# Patient Record
Sex: Female | Born: 1935 | Race: White | Hispanic: No | Smoking: Former smoker
Health system: Southern US, Community
[De-identification: ages and names within clinical notes are randomized; demographics above are authoritative.]

## PROBLEM LIST (undated history)

## (undated) DIAGNOSIS — R002 Palpitations: Secondary | ICD-10-CM

## (undated) DIAGNOSIS — F0280 Dementia in other diseases classified elsewhere without behavioral disturbance: Secondary | ICD-10-CM

## (undated) DIAGNOSIS — E538 Deficiency of other specified B group vitamins: Secondary | ICD-10-CM

## (undated) DIAGNOSIS — Z974 Presence of external hearing-aid: Secondary | ICD-10-CM

## (undated) DIAGNOSIS — I10 Essential (primary) hypertension: Secondary | ICD-10-CM

## (undated) DIAGNOSIS — C50919 Malignant neoplasm of unspecified site of unspecified female breast: Secondary | ICD-10-CM

## (undated) DIAGNOSIS — M810 Age-related osteoporosis without current pathological fracture: Secondary | ICD-10-CM

## (undated) DIAGNOSIS — Z9114 Patient's other noncompliance with medication regimen: Secondary | ICD-10-CM

## (undated) DIAGNOSIS — I8390 Asymptomatic varicose veins of unspecified lower extremity: Secondary | ICD-10-CM

## (undated) DIAGNOSIS — G4700 Insomnia, unspecified: Secondary | ICD-10-CM

## (undated) DIAGNOSIS — I8310 Varicose veins of unspecified lower extremity with inflammation: Secondary | ICD-10-CM

## (undated) DIAGNOSIS — M1990 Unspecified osteoarthritis, unspecified site: Secondary | ICD-10-CM

## (undated) DIAGNOSIS — F419 Anxiety disorder, unspecified: Secondary | ICD-10-CM

## (undated) DIAGNOSIS — R9389 Abnormal findings on diagnostic imaging of other specified body structures: Secondary | ICD-10-CM

## (undated) DIAGNOSIS — R443 Hallucinations, unspecified: Secondary | ICD-10-CM

## (undated) DIAGNOSIS — Z91148 Patient's other noncompliance with medication regimen for other reason: Secondary | ICD-10-CM

## (undated) DIAGNOSIS — E559 Vitamin D deficiency, unspecified: Secondary | ICD-10-CM

## (undated) DIAGNOSIS — R0602 Shortness of breath: Secondary | ICD-10-CM

## (undated) DIAGNOSIS — G8929 Other chronic pain: Secondary | ICD-10-CM

## (undated) HISTORY — DX: Dementia in other diseases classified elsewhere, unspecified severity, without behavioral disturbance, psychotic disturbance, mood disturbance, and anxiety: F02.80

## (undated) HISTORY — DX: Malignant neoplasm of unspecified site of unspecified female breast: C50.919

## (undated) HISTORY — PX: TONSILLECTOMY: SUR1361

## (undated) HISTORY — DX: Shortness of breath: R06.02

## (undated) HISTORY — DX: Age-related osteoporosis without current pathological fracture: M81.0

## (undated) HISTORY — DX: Other chronic pain: G89.29

## (undated) HISTORY — DX: Varicose veins of unspecified lower extremity with inflammation: I83.10

## (undated) HISTORY — DX: Presence of external hearing-aid: Z97.4

## (undated) HISTORY — PX: CATARACT EXTRACTION, BILATERAL: SHX1313

## (undated) HISTORY — DX: Anxiety disorder, unspecified: F41.9

## (undated) HISTORY — DX: Patient's other noncompliance with medication regimen: Z91.14

## (undated) HISTORY — DX: Patient's other noncompliance with medication regimen for other reason: Z91.148

## (undated) HISTORY — DX: Palpitations: R00.2

## (undated) HISTORY — DX: Insomnia, unspecified: G47.00

## (undated) HISTORY — DX: Deficiency of other specified B group vitamins: E53.8

## (undated) HISTORY — DX: Abnormal findings on diagnostic imaging of other specified body structures: R93.89

## (undated) HISTORY — DX: Vitamin D deficiency, unspecified: E55.9

## (undated) HISTORY — DX: Unspecified osteoarthritis, unspecified site: M19.90

## (undated) HISTORY — DX: Hallucinations, unspecified: R44.3

## (undated) HISTORY — DX: Essential (primary) hypertension: I10

## (undated) HISTORY — DX: Asymptomatic varicose veins of unspecified lower extremity: I83.90

---

## 1968-04-20 HISTORY — PX: OTHER SURGICAL HISTORY: SHX169

## 1978-04-20 HISTORY — PX: TUBAL LIGATION: SHX77

## 1994-04-20 HISTORY — PX: BREAST LUMPECTOMY: SHX2

## 1994-08-19 DIAGNOSIS — C50919 Malignant neoplasm of unspecified site of unspecified female breast: Secondary | ICD-10-CM

## 1994-08-19 HISTORY — DX: Malignant neoplasm of unspecified site of unspecified female breast: C50.919

## 1997-10-30 ENCOUNTER — Other Ambulatory Visit: Admission: RE | Admit: 1997-10-30 | Discharge: 1997-10-30 | Payer: Self-pay | Admitting: Oncology

## 1997-11-28 ENCOUNTER — Ambulatory Visit (HOSPITAL_COMMUNITY): Admission: RE | Admit: 1997-11-28 | Discharge: 1997-11-28 | Payer: Self-pay | Admitting: Hematology and Oncology

## 1998-12-03 ENCOUNTER — Ambulatory Visit (HOSPITAL_COMMUNITY): Admission: RE | Admit: 1998-12-03 | Discharge: 1998-12-03 | Payer: Self-pay | Admitting: Hematology and Oncology

## 1998-12-03 ENCOUNTER — Encounter: Payer: Self-pay | Admitting: Hematology and Oncology

## 1999-02-24 ENCOUNTER — Other Ambulatory Visit: Admission: RE | Admit: 1999-02-24 | Discharge: 1999-02-24 | Payer: Self-pay | Admitting: Family Medicine

## 1999-03-06 ENCOUNTER — Encounter: Admission: RE | Admit: 1999-03-06 | Discharge: 1999-03-06 | Payer: Self-pay | Admitting: Otolaryngology

## 1999-03-06 ENCOUNTER — Encounter: Payer: Self-pay | Admitting: Otolaryngology

## 1999-04-29 ENCOUNTER — Other Ambulatory Visit: Admission: RE | Admit: 1999-04-29 | Discharge: 1999-04-29 | Payer: Self-pay | Admitting: Otolaryngology

## 1999-08-07 ENCOUNTER — Encounter: Admission: RE | Admit: 1999-08-07 | Discharge: 1999-08-07 | Payer: Self-pay | Admitting: *Deleted

## 1999-11-18 ENCOUNTER — Encounter: Payer: Self-pay | Admitting: Hematology and Oncology

## 1999-11-18 ENCOUNTER — Encounter: Admission: RE | Admit: 1999-11-18 | Discharge: 1999-11-18 | Payer: Self-pay | Admitting: Hematology and Oncology

## 1999-12-09 ENCOUNTER — Encounter: Admission: RE | Admit: 1999-12-09 | Discharge: 1999-12-09 | Payer: Self-pay | Admitting: Hematology and Oncology

## 1999-12-09 ENCOUNTER — Encounter: Payer: Self-pay | Admitting: Hematology and Oncology

## 2000-01-28 ENCOUNTER — Encounter: Admission: RE | Admit: 2000-01-28 | Discharge: 2000-01-28 | Payer: Self-pay | Admitting: *Deleted

## 2000-05-07 ENCOUNTER — Encounter: Admission: RE | Admit: 2000-05-07 | Discharge: 2000-05-07 | Payer: Self-pay | Admitting: Family Medicine

## 2000-05-07 ENCOUNTER — Encounter: Payer: Self-pay | Admitting: Family Medicine

## 2000-05-25 ENCOUNTER — Other Ambulatory Visit: Admission: RE | Admit: 2000-05-25 | Discharge: 2000-05-25 | Payer: Self-pay | Admitting: Obstetrics and Gynecology

## 2000-09-24 ENCOUNTER — Encounter: Admission: RE | Admit: 2000-09-24 | Discharge: 2000-09-24 | Payer: Self-pay | Admitting: Family Medicine

## 2000-09-24 ENCOUNTER — Encounter: Payer: Self-pay | Admitting: Family Medicine

## 2001-01-04 ENCOUNTER — Encounter: Admission: RE | Admit: 2001-01-04 | Discharge: 2001-01-04 | Payer: Self-pay | Admitting: Family Medicine

## 2001-01-04 ENCOUNTER — Encounter: Payer: Self-pay | Admitting: Family Medicine

## 2002-05-01 ENCOUNTER — Encounter: Payer: Self-pay | Admitting: Family Medicine

## 2002-05-01 ENCOUNTER — Encounter: Admission: RE | Admit: 2002-05-01 | Discharge: 2002-05-01 | Payer: Self-pay | Admitting: Family Medicine

## 2002-08-04 ENCOUNTER — Other Ambulatory Visit: Admission: RE | Admit: 2002-08-04 | Discharge: 2002-08-04 | Payer: Self-pay | Admitting: Family Medicine

## 2004-08-29 ENCOUNTER — Emergency Department (HOSPITAL_COMMUNITY): Admission: EM | Admit: 2004-08-29 | Discharge: 2004-08-29 | Payer: Self-pay | Admitting: Emergency Medicine

## 2004-12-31 ENCOUNTER — Ambulatory Visit: Payer: Self-pay | Admitting: Oncology

## 2005-03-06 ENCOUNTER — Encounter: Admission: RE | Admit: 2005-03-06 | Discharge: 2005-03-06 | Payer: Self-pay | Admitting: Family Medicine

## 2005-06-09 ENCOUNTER — Ambulatory Visit (HOSPITAL_COMMUNITY): Admission: RE | Admit: 2005-06-09 | Discharge: 2005-06-09 | Payer: Self-pay | Admitting: Family Medicine

## 2005-09-16 ENCOUNTER — Emergency Department (HOSPITAL_COMMUNITY): Admission: EM | Admit: 2005-09-16 | Discharge: 2005-09-16 | Payer: Self-pay | Admitting: Emergency Medicine

## 2005-09-18 ENCOUNTER — Encounter: Admission: RE | Admit: 2005-09-18 | Discharge: 2005-09-18 | Payer: Self-pay | Admitting: Family Medicine

## 2005-12-29 ENCOUNTER — Ambulatory Visit: Payer: Self-pay | Admitting: Oncology

## 2006-08-31 ENCOUNTER — Ambulatory Visit: Payer: Self-pay | Admitting: Oncology

## 2007-02-02 ENCOUNTER — Ambulatory Visit: Payer: Self-pay | Admitting: Oncology

## 2007-08-04 ENCOUNTER — Ambulatory Visit: Payer: Self-pay | Admitting: Oncology

## 2007-10-25 ENCOUNTER — Ambulatory Visit: Payer: Self-pay | Admitting: Surgery

## 2008-02-14 ENCOUNTER — Ambulatory Visit: Payer: Self-pay | Admitting: Oncology

## 2008-02-16 ENCOUNTER — Emergency Department (HOSPITAL_COMMUNITY): Admission: EM | Admit: 2008-02-16 | Discharge: 2008-02-16 | Payer: Self-pay | Admitting: Emergency Medicine

## 2008-09-12 ENCOUNTER — Ambulatory Visit: Payer: Self-pay | Admitting: Oncology

## 2009-03-12 ENCOUNTER — Ambulatory Visit: Payer: Self-pay | Admitting: Oncology

## 2009-12-10 ENCOUNTER — Encounter: Admission: RE | Admit: 2009-12-10 | Discharge: 2009-12-10 | Payer: Self-pay | Admitting: Cardiology

## 2009-12-10 ENCOUNTER — Ambulatory Visit: Payer: Self-pay | Admitting: Cardiology

## 2009-12-16 ENCOUNTER — Encounter: Admission: RE | Admit: 2009-12-16 | Discharge: 2009-12-16 | Payer: Self-pay | Admitting: Cardiology

## 2009-12-17 ENCOUNTER — Ambulatory Visit: Payer: Self-pay | Admitting: Cardiology

## 2009-12-17 ENCOUNTER — Ambulatory Visit (HOSPITAL_COMMUNITY): Admission: RE | Admit: 2009-12-17 | Discharge: 2009-12-17 | Payer: Self-pay | Admitting: Cardiology

## 2009-12-17 HISTORY — PX: US ECHOCARDIOGRAPHY: HXRAD669

## 2009-12-27 ENCOUNTER — Ambulatory Visit: Payer: Self-pay | Admitting: Cardiology

## 2009-12-31 ENCOUNTER — Ambulatory Visit: Payer: Self-pay | Admitting: Cardiology

## 2010-01-21 ENCOUNTER — Ambulatory Visit: Payer: Self-pay | Admitting: Cardiology

## 2010-03-14 ENCOUNTER — Ambulatory Visit: Payer: Self-pay | Admitting: Oncology

## 2010-04-23 ENCOUNTER — Encounter
Admission: RE | Admit: 2010-04-23 | Discharge: 2010-04-23 | Payer: Self-pay | Source: Home / Self Care | Attending: Cardiology | Admitting: Cardiology

## 2010-04-24 ENCOUNTER — Ambulatory Visit: Payer: Self-pay | Admitting: Oncology

## 2010-05-16 ENCOUNTER — Emergency Department (HOSPITAL_COMMUNITY)
Admission: EM | Admit: 2010-05-16 | Discharge: 2010-05-16 | Payer: Self-pay | Source: Home / Self Care | Admitting: Family Medicine

## 2010-05-16 ENCOUNTER — Other Ambulatory Visit: Payer: Self-pay | Admitting: Oncology

## 2010-05-16 DIAGNOSIS — R911 Solitary pulmonary nodule: Secondary | ICD-10-CM

## 2010-05-16 DIAGNOSIS — Z853 Personal history of malignant neoplasm of breast: Secondary | ICD-10-CM

## 2010-06-09 ENCOUNTER — Ambulatory Visit (INDEPENDENT_AMBULATORY_CARE_PROVIDER_SITE_OTHER): Payer: Medicare Other | Admitting: Cardiology

## 2010-06-09 DIAGNOSIS — R002 Palpitations: Secondary | ICD-10-CM

## 2010-06-09 DIAGNOSIS — I10 Essential (primary) hypertension: Secondary | ICD-10-CM

## 2010-09-02 NOTE — Procedures (Signed)
DUPLEX DEEP VENOUS EXAM - LOWER EXTREMITY   INDICATION:  Right lower extremity pain.   HISTORY:  Edema:  No.  Trauma/Surgery:  No.  Pain:  Yes.  PE:  No.  Previous DVT:  None.  Anticoagulants:  None.  Other:   DUPLEX EXAM:                CFV   SFV   PopV  PTV    GSV                R  L  R  L  R  L  R   L  R  L  Thrombosis    o  o  o     o     o      o  Spontaneous   +  +  +     +     +      +  Phasic        +  +  +     +     +      +  Augmentation  +  +  +     +     +      +  Compressible  +  +  +     +     +      +  Competent     +  +  +     +     +      +   Legend:  + - yes  o - no  p - partial  D - decreased   IMPRESSION:  No evidence of deep venous thrombosis noted in the right  leg.    _____________________________  V. Charlena Cross, MD   MG/MEDQ  D:  10/25/2007  T:  10/25/2007  Job:  [id number]

## 2010-11-28 ENCOUNTER — Encounter: Payer: Self-pay | Admitting: Nurse Practitioner

## 2010-12-08 ENCOUNTER — Encounter: Payer: Self-pay | Admitting: Nurse Practitioner

## 2010-12-08 ENCOUNTER — Ambulatory Visit (INDEPENDENT_AMBULATORY_CARE_PROVIDER_SITE_OTHER): Payer: Medicare Other | Admitting: Nurse Practitioner

## 2010-12-08 VITALS — BP 145/80 | HR 80 | Ht 68.0 in | Wt 114.8 lb

## 2010-12-08 DIAGNOSIS — F411 Generalized anxiety disorder: Secondary | ICD-10-CM

## 2010-12-08 DIAGNOSIS — R030 Elevated blood-pressure reading, without diagnosis of hypertension: Secondary | ICD-10-CM

## 2010-12-08 DIAGNOSIS — F419 Anxiety disorder, unspecified: Secondary | ICD-10-CM | POA: Insufficient documentation

## 2010-12-08 DIAGNOSIS — E785 Hyperlipidemia, unspecified: Secondary | ICD-10-CM

## 2010-12-08 DIAGNOSIS — IMO0001 Reserved for inherently not codable concepts without codable children: Secondary | ICD-10-CM | POA: Insufficient documentation

## 2010-12-08 DIAGNOSIS — R9389 Abnormal findings on diagnostic imaging of other specified body structures: Secondary | ICD-10-CM | POA: Insufficient documentation

## 2010-12-08 NOTE — Assessment & Plan Note (Signed)
Followed by Dr Sherrill.  

## 2010-12-08 NOTE — Assessment & Plan Note (Signed)
She is agreeable to checking some readings for me at home. I will see her back in 3 months to check on her.

## 2010-12-08 NOTE — Patient Instructions (Addendum)
Stay on your current medicines I want you to check some blood pressures for me at home. Keep a diary. Let me know if your blood pressure is staying up at home  Goal is for your blood pressure to be less than 140/90 We are going to check some labs today I will see you in 3 months.

## 2010-12-08 NOTE — Assessment & Plan Note (Signed)
She is chronically anxious. I encouraged her to remain active.

## 2010-12-08 NOTE — Progress Notes (Signed)
    Rachael Perez Date of Birth: 1926-12-24   History of Present Illness: Rachael Perez is seen today for her 6 month visit. She is a former patient of Dr. Ronnald Nian. She is now 77 years of age. She does not have chest pain but has a tendency towards palpitations. She takes no medicines whatsoever. She was suppose to take Reclast but she has refused. She is only on Calcium. Not sure why she is off her iron or B12. No recent labs drawn per her report. She is chronically anxious and is more so today. Her son Rachael Hua is planning on separating from his wife and may be coming back to live with her. She is worried. She says her blood pressure is ok at home but I am not convinced that she is actually checking it.   Current Outpatient Prescriptions on File Prior to Visit  Medication Sig Dispense Refill  . Calcium Carbonate-Vitamin D (CALCIUM + D PO) Take by mouth daily.        . IRON PO Take by mouth daily.        . vitamin B-12 (CYANOCOBALAMIN) 100 MCG tablet Take 50 mcg by mouth daily.          Allergies  Allergen Reactions  . Codeine     Past Medical History  Diagnosis Date  . Hypertension   . Palpitations   . Anxiety   . SOB (shortness of breath) on exertion   . History of medication noncompliance   . Varicose veins   . Wears hearing aid   . Breast cancer     Remote lumpectomy on L    Past Surgical History  Procedure Date  . Tonsillectomy   . Tubal ligation   . Breast lumpectomy   . Cataract extraction, bilateral   . US echocardiography 12/17/2009    EF 55-60%    History  Smoking status  . Former Smoker  . Quit date: 04/20/1952  Smokeless tobacco  . Not on file    History  Alcohol Use No    Family History  Problem Relation Age of Onset  . Heart disease Mother   . Heart disease Father   . Heart disease Sister   . Heart disease Brother   . Heart disease Sister   . Heart disease Sister     Review of Systems: The review of systems is positive for chronic  anxieties.  All other systems were reviewed and are negative.  Physical Exam: BP 145/80  Pulse 80  Ht 5\' 8"  (1.727 m)  Wt 114 lb 12.8 oz (52.073 kg)  BMI 17.46 kg/m2 Patient is very pleasant and in no acute distress. She has definite flight of ideas and is anxious but unchanged from prior visits. She is thin. Skin is warm and dry. Color is normal.  HEENT is unremarkable. Normocephalic/atraumatic. PERRL. Sclera are nonicteric. Neck is supple. No masses. No JVD. Lungs are clear. Cardiac exam shows a regular rate and rhythm. Abdomen is soft. Extremities are without edema. Gait and ROM are intact. No gross neurologic deficits noted.  LABORATORY DATA: PENDING  Assessment / Plan:

## 2010-12-09 ENCOUNTER — Telehealth: Payer: Self-pay | Admitting: Nurse Practitioner

## 2010-12-09 LAB — LIPID PANEL
Cholesterol: 198 mg/dL (ref 0–200)
HDL: 67.8 mg/dL (ref >39.00)
LDL Cholesterol: 109 mg/dL — ABNORMAL HIGH (ref 0–99)
Total CHOL/HDL Ratio: 3
Triglycerides: 104 mg/dL (ref 0.0–149.0)
VLDL: 20.8 mg/dL (ref 0.0–40.0)

## 2010-12-09 LAB — HEPATIC FUNCTION PANEL
ALT: 15 U/L (ref 0–35)
AST: 27 U/L (ref 0–37)
Albumin: 4.3 g/dL (ref 3.5–5.2)
Alkaline Phosphatase: 54 U/L (ref 39–117)
Bilirubin, Direct: 0.1 mg/dL (ref 0.0–0.3)
Total Bilirubin: 0.9 mg/dL (ref 0.3–1.2)
Total Protein: 7.3 g/dL (ref 6.0–8.3)

## 2010-12-09 LAB — CBC WITH DIFFERENTIAL/PLATELET
Basophils Absolute: 0.1 10*3/uL (ref 0.0–0.1)
Basophils Relative: 1.1 % (ref 0.0–3.0)
Eosinophils Absolute: 0.1 10*3/uL (ref 0.0–0.7)
Eosinophils Relative: 0.8 % (ref 0.0–5.0)
HCT: 42.8 % (ref 36.0–46.0)
Hemoglobin: 14.3 g/dL (ref 12.0–15.0)
Lymphocytes Relative: 13.9 % (ref 12.0–46.0)
Lymphs Abs: 1.3 10*3/uL (ref 0.7–4.0)
MCHC: 33.3 g/dL (ref 30.0–36.0)
MCV: 96.2 fl (ref 78.0–100.0)
Monocytes Absolute: 0.6 10*3/uL (ref 0.1–1.0)
Monocytes Relative: 6 % (ref 3.0–12.0)
Neutro Abs: 7.4 10*3/uL (ref 1.4–7.7)
Neutrophils Relative %: 78.2 % — ABNORMAL HIGH (ref 43.0–77.0)
Platelets: 186 10*3/uL (ref 150.0–400.0)
RBC: 4.45 Mil/uL (ref 3.87–5.11)
RDW: 13.3 % (ref 11.5–14.6)
WBC: 9.5 10*3/uL (ref 4.5–10.5)

## 2010-12-09 LAB — BASIC METABOLIC PANEL
BUN: 22 mg/dL (ref 6–23)
CO2: 31 mEq/L (ref 19–32)
Calcium: 9.8 mg/dL (ref 8.4–10.5)
Chloride: 104 mEq/L (ref 96–112)
Creatinine, Ser: 0.6 mg/dL (ref 0.4–1.2)
GFR: 97.87 mL/min (ref >60.00)
Glucose, Bld: 103 mg/dL — ABNORMAL HIGH (ref 70–99)
Potassium: 6.2 mEq/L (ref 3.5–5.1)
Sodium: 142 mEq/L (ref 135–145)

## 2010-12-09 NOTE — Telephone Encounter (Signed)
Pt saw Lawson Fiscal on Monday and her BP was up She is confused on how to take meds and to keep track of BP at home please call

## 2010-12-09 NOTE — Telephone Encounter (Signed)
Wanted to know when and how she should take her BP. States she can't take BP in (L) arm because of cancer. States hard getting BP cuff on (R) arm. Said she would keep trying. Advised to take in AM and if BP over 140/90 then call us. Write down BP every day so we can see trend of pressures.

## 2010-12-10 ENCOUNTER — Telehealth: Payer: Self-pay | Admitting: *Deleted

## 2010-12-10 NOTE — Telephone Encounter (Signed)
Notified of lab results. States she went to [facility] yesterday and had BP taken. It was 193/78. Advised she needed to stop foods w/potassium. She is to see Dr. Bufford Spikes at [facility] on Fri. Will get repeat Potassium then. Have left message for Dr. Ernest Mallick CMA to call me back. Will await office visit on Fri to reevaluate BP.

## 2010-12-10 NOTE — Telephone Encounter (Signed)
Message copied by Lorayne Bender on Wed Dec 10, 2010  9:44 AM ------      Message from: Rosalio Macadamia      Created: Tue Dec 09, 2010  3:40 PM       Ok to report. Labs are satisfactory except for potassium. ? Of hemolysis. Needs to repeat. Cholesterol is ok.

## 2010-12-11 ENCOUNTER — Other Ambulatory Visit: Payer: PRIVATE HEALTH INSURANCE | Admitting: *Deleted

## 2011-02-02 ENCOUNTER — Encounter: Payer: Self-pay | Admitting: *Deleted

## 2011-02-13 ENCOUNTER — Other Ambulatory Visit (HOSPITAL_COMMUNITY): Payer: Self-pay

## 2011-02-13 ENCOUNTER — Inpatient Hospital Stay (HOSPITAL_COMMUNITY): Admission: RE | Admit: 2011-02-13 | Payer: Self-pay | Source: Ambulatory Visit

## 2011-02-21 ENCOUNTER — Telehealth: Payer: Self-pay | Admitting: Oncology

## 2011-02-21 NOTE — Telephone Encounter (Signed)
lmonvm advising the pt that her nov appt had to be cancelled due to the new epic system overhaul and we need to r/s her appt for jan. Asked the pt to call me back to r/s the appt.

## 2011-02-25 ENCOUNTER — Encounter (HOSPITAL_COMMUNITY)
Admission: RE | Admit: 2011-02-25 | Discharge: 2011-02-25 | Disposition: A | Discharge status: Home / Self Care | Payer: Medicare Other | Source: Ambulatory Visit | Attending: Oncology | Admitting: Oncology

## 2011-02-25 DIAGNOSIS — J984 Other disorders of lung: Secondary | ICD-10-CM | POA: Insufficient documentation

## 2011-02-25 DIAGNOSIS — Z853 Personal history of malignant neoplasm of breast: Secondary | ICD-10-CM | POA: Insufficient documentation

## 2011-02-25 DIAGNOSIS — R911 Solitary pulmonary nodule: Secondary | ICD-10-CM

## 2011-03-10 ENCOUNTER — Encounter: Payer: Self-pay | Admitting: Nurse Practitioner

## 2011-03-10 ENCOUNTER — Ambulatory Visit (INDEPENDENT_AMBULATORY_CARE_PROVIDER_SITE_OTHER): Payer: Medicare Other | Admitting: Nurse Practitioner

## 2011-03-10 VITALS — BP 120/82 | HR 80 | Ht 68.0 in | Wt 111.1 lb

## 2011-03-10 DIAGNOSIS — IMO0001 Reserved for inherently not codable concepts without codable children: Secondary | ICD-10-CM

## 2011-03-10 DIAGNOSIS — R9389 Abnormal findings on diagnostic imaging of other specified body structures: Secondary | ICD-10-CM

## 2011-03-10 DIAGNOSIS — R030 Elevated blood-pressure reading, without diagnosis of hypertension: Secondary | ICD-10-CM

## 2011-03-10 DIAGNOSIS — F419 Anxiety disorder, unspecified: Secondary | ICD-10-CM

## 2011-03-10 DIAGNOSIS — F411 Generalized anxiety disorder: Secondary | ICD-10-CM

## 2011-03-10 NOTE — Progress Notes (Signed)
   Rachael Perez Date of Birth: May 20, 1926 Medical Record #[id number]  History of Present Illness: Rachael Perez is seen today for a follow up visit. It is a 3 month check. She is a former patient of Dr. Ronnald Nian. She is seen for Dr. Elease Hashimoto. She is doing ok. Says she is "just getting old". She remains stressed and anxious. She has lost some more weight. She is stressed with a son's divorce. He is living with her for a week at a time. No chest pain. She has been put on blood pressure medicines. She is not taking the lisinopril but is on the HCTZ. Blood pressure is better today. No chest pain.   Current Outpatient Prescriptions on File Prior to Visit  Medication Sig Dispense Refill  . Calcium Carbonate-Vitamin D (CALCIUM + D PO) Take 600 mg by mouth daily.         Allergies  Allergen Reactions  . Codeine Shortness Of Breath    Past Medical History  Diagnosis Date  . Hypertension   . Palpitations   . Anxiety   . SOB (shortness of breath) on exertion   . History of medication noncompliance   . Varicose veins   . Wears hearing aid   . Abnormal chest CT     Lesion is unchanged; Followed by Dr. Truett Perna  . Breast cancer 08/1994    Remote lumpectomy on L    Past Surgical History  Procedure Date  . Tonsillectomy   . Tubal ligation   . Breast lumpectomy   . Cataract extraction, bilateral   . US echocardiography 12/17/2009    EF 55-60%    History  Smoking status  . Former Smoker  . Quit date: 04/20/1952  Smokeless tobacco  . Not on file    History  Alcohol Use No    Family History  Problem Relation Age of Onset  . Heart disease Mother   . Heart disease Father   . Heart disease Sister   . Heart disease Brother   . Heart disease Sister   . Heart disease Sister     Review of Systems: The review of systems is positive for stress with her child's divorce. Not sleeping well. Has some burning in her feet. Tries to do yoga and overall is pretty active. She is able to do  all of her activities of living. All other systems were reviewed and are negative.  Physical Exam: BP 120/82  Pulse 80  Ht 5\' 8"  (1.727 m)  Wt 111 lb 1.9 oz (50.404 kg)  BMI 16.90 kg/m2 Patient is very pleasant and in no acute distress. She has chronic flight of ideas. Skin is warm and dry. Color is normal.  HEENT is unremarkable. Normocephalic/atraumatic. PERRL. Sclera are nonicteric. Neck is supple. No masses. No JVD. Lungs are clear. Cardiac exam shows a regular rate and rhythm. Abdomen is soft. Extremities are without edema. Gait and ROM are intact. No gross neurologic deficits noted.  LABORATORY DATA:   Assessment / Plan:

## 2011-03-10 NOTE — Assessment & Plan Note (Signed)
She has follow up planned in January with Dr. Truett Perna and has orders for repeat chest CT.

## 2011-03-10 NOTE — Assessment & Plan Note (Signed)
No change at this time.

## 2011-03-10 NOTE — Assessment & Plan Note (Signed)
Blood pressure is better. She is just on the HCTZ and not taking Lisinopril. She is doing well. I will see her back in 6 months. Patient is agreeable to this plan and will call if any problems develop in the interim.

## 2011-03-10 NOTE — Patient Instructions (Addendum)
I think you are doing well except increase your intake of Ensure.   You need to be gaining weight.  We will see you in 6 months.

## 2011-04-24 DIAGNOSIS — J029 Acute pharyngitis, unspecified: Secondary | ICD-10-CM | POA: Diagnosis not present

## 2011-04-30 ENCOUNTER — Telehealth: Payer: Self-pay | Admitting: Oncology

## 2011-04-30 DIAGNOSIS — Z23 Encounter for immunization: Secondary | ICD-10-CM | POA: Diagnosis not present

## 2011-04-30 NOTE — Telephone Encounter (Signed)
Received message from desk nurse that pt called re next appts. Nov appt r/s along w/nov ct. lmonvm for pt re appts for 2/11 ct and 2/15 f/u w/BS. Feb appt mailed today.

## 2011-05-07 DIAGNOSIS — C50919 Malignant neoplasm of unspecified site of unspecified female breast: Secondary | ICD-10-CM | POA: Diagnosis not present

## 2011-05-07 DIAGNOSIS — I10 Essential (primary) hypertension: Secondary | ICD-10-CM | POA: Diagnosis not present

## 2011-05-07 DIAGNOSIS — R918 Other nonspecific abnormal finding of lung field: Secondary | ICD-10-CM | POA: Diagnosis not present

## 2011-05-07 DIAGNOSIS — E559 Vitamin D deficiency, unspecified: Secondary | ICD-10-CM | POA: Diagnosis not present

## 2011-05-07 DIAGNOSIS — F411 Generalized anxiety disorder: Secondary | ICD-10-CM | POA: Diagnosis not present

## 2011-05-08 DIAGNOSIS — L719 Rosacea, unspecified: Secondary | ICD-10-CM | POA: Diagnosis not present

## 2011-05-08 DIAGNOSIS — L820 Inflamed seborrheic keratosis: Secondary | ICD-10-CM | POA: Diagnosis not present

## 2011-05-22 ENCOUNTER — Other Ambulatory Visit: Payer: Self-pay | Admitting: Internal Medicine

## 2011-05-22 ENCOUNTER — Ambulatory Visit
Admission: RE | Admit: 2011-05-22 | Discharge: 2011-05-22 | Disposition: A | Discharge status: Home / Self Care | Payer: Medicare Other | Source: Ambulatory Visit | Attending: Internal Medicine | Admitting: Internal Medicine

## 2011-05-22 DIAGNOSIS — G8929 Other chronic pain: Secondary | ICD-10-CM

## 2011-05-22 DIAGNOSIS — M810 Age-related osteoporosis without current pathological fracture: Secondary | ICD-10-CM | POA: Diagnosis not present

## 2011-05-22 DIAGNOSIS — M899 Disorder of bone, unspecified: Secondary | ICD-10-CM | POA: Diagnosis not present

## 2011-05-22 DIAGNOSIS — F411 Generalized anxiety disorder: Secondary | ICD-10-CM | POA: Diagnosis not present

## 2011-05-22 DIAGNOSIS — M25859 Other specified joint disorders, unspecified hip: Secondary | ICD-10-CM | POA: Diagnosis not present

## 2011-05-22 DIAGNOSIS — M169 Osteoarthritis of hip, unspecified: Secondary | ICD-10-CM | POA: Diagnosis not present

## 2011-05-22 DIAGNOSIS — M47817 Spondylosis without myelopathy or radiculopathy, lumbosacral region: Secondary | ICD-10-CM | POA: Diagnosis not present

## 2011-05-22 DIAGNOSIS — M62838 Other muscle spasm: Secondary | ICD-10-CM | POA: Diagnosis not present

## 2011-05-22 DIAGNOSIS — M25559 Pain in unspecified hip: Secondary | ICD-10-CM | POA: Diagnosis not present

## 2011-05-23 ENCOUNTER — Telehealth: Payer: Self-pay | Admitting: Oncology

## 2011-05-23 NOTE — Telephone Encounter (Signed)
S/w pt re new appt for 2/13 @ 11:54mam. appt r/s from 2/15 due to PAL. Also confirmed 2/11 ct.

## 2011-06-01 ENCOUNTER — Encounter (HOSPITAL_COMMUNITY): Payer: Self-pay

## 2011-06-01 ENCOUNTER — Ambulatory Visit (HOSPITAL_COMMUNITY)
Admission: RE | Admit: 2011-06-01 | Discharge: 2011-06-01 | Disposition: A | Discharge status: Home / Self Care | Payer: Medicare Other | Source: Ambulatory Visit | Attending: Oncology | Admitting: Oncology

## 2011-06-01 DIAGNOSIS — R0602 Shortness of breath: Secondary | ICD-10-CM | POA: Diagnosis not present

## 2011-06-01 DIAGNOSIS — M949 Disorder of cartilage, unspecified: Secondary | ICD-10-CM | POA: Insufficient documentation

## 2011-06-01 DIAGNOSIS — N289 Disorder of kidney and ureter, unspecified: Secondary | ICD-10-CM | POA: Insufficient documentation

## 2011-06-01 DIAGNOSIS — M899 Disorder of bone, unspecified: Secondary | ICD-10-CM | POA: Diagnosis not present

## 2011-06-01 DIAGNOSIS — I771 Stricture of artery: Secondary | ICD-10-CM | POA: Insufficient documentation

## 2011-06-01 DIAGNOSIS — Z853 Personal history of malignant neoplasm of breast: Secondary | ICD-10-CM | POA: Insufficient documentation

## 2011-06-01 DIAGNOSIS — E079 Disorder of thyroid, unspecified: Secondary | ICD-10-CM | POA: Insufficient documentation

## 2011-06-01 DIAGNOSIS — I2510 Atherosclerotic heart disease of native coronary artery without angina pectoris: Secondary | ICD-10-CM | POA: Insufficient documentation

## 2011-06-01 DIAGNOSIS — J984 Other disorders of lung: Secondary | ICD-10-CM | POA: Diagnosis not present

## 2011-06-03 ENCOUNTER — Ambulatory Visit (HOSPITAL_BASED_OUTPATIENT_CLINIC_OR_DEPARTMENT_OTHER): Payer: Medicare Other | Admitting: Oncology

## 2011-06-03 ENCOUNTER — Telehealth: Payer: Self-pay | Admitting: Oncology

## 2011-06-03 VITALS — BP 146/57 | HR 77 | Temp 96.8°F | Ht 68.0 in | Wt 114.1 lb

## 2011-06-03 DIAGNOSIS — Z853 Personal history of malignant neoplasm of breast: Secondary | ICD-10-CM | POA: Diagnosis not present

## 2011-06-03 DIAGNOSIS — C50919 Malignant neoplasm of unspecified site of unspecified female breast: Secondary | ICD-10-CM

## 2011-06-03 NOTE — Telephone Encounter (Signed)
Gv pt appt for [id number].  scheduled pt for appt with Dr. Dwain Sarna on 06/23/2011 @ 8:40am

## 2011-06-03 NOTE — Progress Notes (Signed)
OFFICE PROGRESS NOTE   INTERVAL HISTORY:   She returns as scheduled. There's been no change over either breast. She has been diagnosed with osteoporosis.  A CT of the chest on 06/01/2011 revealed no change in the nodular groundglass area in the right upper lobe compared to a CT from August of 2011.  Objective:  Vital signs in last 24 hours:  Blood pressure 146/57, pulse 77, temperature 96.8 F (36 C), temperature source Oral, height 5\' 8"  (1.727 m), weight 114 lb 1.6 oz (51.755 kg).    HEENT: Neck without mass Lymphatics: No cervical or supraclavicular nodes. 1/2 cm cutaneous nodular lesion inferior to the left axillary scar. "Shoddy "right axillary node. No other axillary nodes. Resp: Lungs clear bilaterally Cardio: Regular rate and rhythm GI: No hepatomegaly Vascular: No leg edema  Breasts: Status post left lumpectomy. No evidence for local tumor recurrence. At the 11 to 12:00 position of the right breast and just outside of the area although there is a 1-1-1/2 cm oval firm area.   Medications: I have reviewed the patient's current medications.  Assessment/Plan: 1. Stage I left-sided breast cancer diagnosed in May 1996.  Status post a left lumpectomy, radiation and 5 years of tamoxifen.  She remains in clinical remission. 2. ? small lymph node inferior to the left axillary scar - stable. 3. History of anxiety and anorexia/weight loss - her weight is stable compared to August of 2012 to Lung lesion on a CT of the chest April 23, 2010 - likely a benign finding.  The lesion is unchanged over the past 1-1/2 years. 4. Firm area at the 11 to 12:00 position of the right breast-likely a benign finding. We will followup on the most recent mammogram and refer her for a surgical evaluation.  Disposition:  She remains in clinical remission from breast cancer. She would like to continue followup at the cancer Center. She will return for an office visit in one year. We will make a referral  to Dr. Dwain Sarna for examination of the right breast and further diagnostic evaluation as indicated.  Rachael Shutters, MD  06/03/2011  12:57 PM

## 2011-06-05 ENCOUNTER — Ambulatory Visit: Payer: Medicare Other | Admitting: Oncology

## 2011-06-15 ENCOUNTER — Ambulatory Visit (INDEPENDENT_AMBULATORY_CARE_PROVIDER_SITE_OTHER): Payer: Medicare Other | Admitting: Nurse Practitioner

## 2011-06-15 ENCOUNTER — Encounter: Payer: Self-pay | Admitting: Nurse Practitioner

## 2011-06-15 VITALS — BP 112/68 | HR 76 | Ht 68.0 in | Wt 115.0 lb

## 2011-06-15 DIAGNOSIS — R9389 Abnormal findings on diagnostic imaging of other specified body structures: Secondary | ICD-10-CM

## 2011-06-15 DIAGNOSIS — R030 Elevated blood-pressure reading, without diagnosis of hypertension: Secondary | ICD-10-CM | POA: Diagnosis not present

## 2011-06-15 DIAGNOSIS — F419 Anxiety disorder, unspecified: Secondary | ICD-10-CM

## 2011-06-15 DIAGNOSIS — F411 Generalized anxiety disorder: Secondary | ICD-10-CM | POA: Diagnosis not present

## 2011-06-15 DIAGNOSIS — IMO0001 Reserved for inherently not codable concepts without codable children: Secondary | ICD-10-CM

## 2011-06-15 DIAGNOSIS — R0789 Other chest pain: Secondary | ICD-10-CM

## 2011-06-15 NOTE — Assessment & Plan Note (Signed)
Blood pressure looks good with her HCTZ. No change in her medicines. She is felt to be stable from our standpoint. We will see her back in 6 months.

## 2011-06-15 NOTE — Patient Instructions (Addendum)
Stay on your current medicines.   You may use some Tylenol PM for sleep.   I will see you in 6 months.   Call the [facility] office at [phone] if you have any questions, problems or concerns.

## 2011-06-15 NOTE — Assessment & Plan Note (Signed)
She seems to be at her baseline.

## 2011-06-15 NOTE — Assessment & Plan Note (Signed)
Has had recent follow up earlier this month. Unchanged per report. Followed by Dr. Truett Perna.

## 2011-06-15 NOTE — Progress Notes (Signed)
   Rachael Perez Date of Birth: [birth year] Medical Record #[id number]  History of Present Illness: Rachael Perez is seen today for a follow up visit. She is seen for Dr. Elease Hashimoto. She is a former patient of Dr. Ronnald Nian. She is doing well. Taking only HCTZ for her blood pressure. Has gained a little weight. Has had her chest CT which is stable. Has seen Dr. Truett Perna. There was concern for something on her mammogram, felt to be benign, but has an appointment to see Dr. Dwain Sarna early in March.  She comes in today. She denies chest pain. No shortness of breath. Remains very stressed with her son's divorce. He is living with her. She remains tired all the time. She has had some issues with her hip and says she was told it was all arthritis.   Current Outpatient Prescriptions on File Prior to Visit  Medication Sig Dispense Refill  . Calcium Carbonate-Vitamin D (CALCIUM + D PO) Take 600 mg by mouth daily.       . DiphenhydrAMINE HCl, Sleep, (SLEEP-AID MAXIMUM STRENGTH PO) Take by mouth at bedtime as needed and may repeat dose one time if needed.        . hydrochlorothiazide (HYDRODIURIL) 25 MG tablet Take 25 mg by mouth daily.        Marland Kitchen ibuprofen (ADVIL,MOTRIN) 200 MG tablet Take 400 mg by mouth every 6 (six) hours as needed.        Allergies  Allergen Reactions  . Codeine Shortness Of Breath    Past Medical History  Diagnosis Date  . Hypertension   . Palpitations   . Anxiety   . SOB (shortness of breath) on exertion   . History of medication noncompliance   . Varicose veins   . Wears hearing aid   . Abnormal chest CT     Lesion is unchanged; Followed by Dr. Truett Perna  . Breast cancer 08/1994    Remote lumpectomy on L    Past Surgical History  Procedure Date  . Tonsillectomy   . Tubal ligation   . Breast lumpectomy   . Cataract extraction, bilateral   . US echocardiography 12/17/2009    EF 55-60%    History  Smoking status  . Former Smoker  . Quit date: 04/20/1952    Smokeless tobacco  . Not on file    History  Alcohol Use  . Yes    occasional    Family History  Problem Relation Age of Onset  . Heart disease Mother   . Heart disease Father   . Heart disease Sister   . Heart disease Brother   . Heart disease Sister   . Heart disease Sister     Review of Systems: The review of systems is positive for stress with her son's divorce.  All other systems were reviewed and are negative.  Physical Exam: BP 112/68  Pulse 76  Ht 5\' 8"  (1.727 m)  Wt 115 lb (52.164 kg)  BMI 17.49 kg/m2 Patient is very pleasant and in no acute distress. She has a chronic flight of ideas. Her weight is up 4 pounds. Skin is warm and dry. Color is normal.  HEENT is unremarkable. Normocephalic/atraumatic. PERRL. Sclera are nonicteric. Neck is supple. No masses. No JVD. Lungs are clear. Cardiac exam shows a regular rate and rhythm. Abdomen is soft. Extremities are without edema. Gait and ROM are intact. No gross neurologic deficits noted.   LABORATORY DATA:   Assessment / Plan:

## 2011-06-19 DIAGNOSIS — L578 Other skin changes due to chronic exposure to nonionizing radiation: Secondary | ICD-10-CM | POA: Diagnosis not present

## 2011-06-19 DIAGNOSIS — R0789 Other chest pain: Secondary | ICD-10-CM | POA: Insufficient documentation

## 2011-06-19 DIAGNOSIS — L820 Inflamed seborrheic keratosis: Secondary | ICD-10-CM | POA: Diagnosis not present

## 2011-06-19 NOTE — Assessment & Plan Note (Signed)
Patient calls on Friday afternoon. Reports that she has had 2 brief episodes of a sharp stabbing pain on Tuesday and Wednesday. This happened after picking up branches and moving chairs. It only lasted for a minute or so. Subsided without any intervention. She felt well on Thursday and feels fine today. She admits that she got very anxious. I have advised her to at least take a baby aspirin daily and to let us know if she has any further spells. If so, she will need further evaluation. Patient is agreeable to this plan and will call if any problems develop in the interim.

## 2011-06-23 ENCOUNTER — Ambulatory Visit (INDEPENDENT_AMBULATORY_CARE_PROVIDER_SITE_OTHER): Payer: Medicare Other | Admitting: General Surgery

## 2011-06-30 ENCOUNTER — Telehealth (INDEPENDENT_AMBULATORY_CARE_PROVIDER_SITE_OTHER): Payer: Self-pay

## 2011-06-30 NOTE — Telephone Encounter (Signed)
LMOM for pt to call me so I can give her the info of cx'd appt for 3-20 and I r/s the appt for 4-12 due to Dr Dwain Sarna in surgery.

## 2011-07-08 ENCOUNTER — Ambulatory Visit (INDEPENDENT_AMBULATORY_CARE_PROVIDER_SITE_OTHER): Payer: Medicare Other | Admitting: General Surgery

## 2011-07-31 ENCOUNTER — Encounter (INDEPENDENT_AMBULATORY_CARE_PROVIDER_SITE_OTHER): Payer: Self-pay | Admitting: General Surgery

## 2011-07-31 ENCOUNTER — Ambulatory Visit (INDEPENDENT_AMBULATORY_CARE_PROVIDER_SITE_OTHER): Payer: Medicare Other | Admitting: General Surgery

## 2011-07-31 VITALS — BP 152/64 | HR 86 | Temp 98.6°F | Ht 68.0 in | Wt <= 1120 oz

## 2011-07-31 DIAGNOSIS — Z853 Personal history of malignant neoplasm of breast: Secondary | ICD-10-CM | POA: Diagnosis not present

## 2011-07-31 DIAGNOSIS — N63 Unspecified lump in unspecified breast: Secondary | ICD-10-CM | POA: Diagnosis not present

## 2011-07-31 NOTE — Progress Notes (Signed)
Patient ID: Rachael Perez, female   DOB: 27-Mar-1927, 78 y.o.   MRN: [id number]  Chief Complaint  Patient presents with  . Pre-op Exam    eval Lt br mass    HPI Rachael Perez is a 77 y.o. female.  Referred by Dr. Mancel Bale HPI 54 yof with history of left breast cancer in 1996 treated with lumpectomy, ?snbx, xrt and tamoxifen. She reports no complaints with her breasts.  She was seen by her oncologist Dr. Truett Perna who felt a right breast mass and is referred for evaluation.    Past Medical History  Diagnosis Date  . Hypertension   . Palpitations   . Anxiety   . SOB (shortness of breath) on exertion   . History of medication noncompliance   . Varicose veins   . Wears hearing aid   . Abnormal chest CT     Lesion is unchanged; Followed by Dr. Truett Perna  . Breast cancer 08/1994    Remote lumpectomy on L  . Arthritis   . Osteoporosis     Past Surgical History  Procedure Date  . Tonsillectomy   . Tubal ligation   . Breast lumpectomy   . Cataract extraction, bilateral   . US echocardiography 12/17/2009    EF 55-60%    Family History  Problem Relation Age of Onset  . Heart disease Mother   . Heart disease Father   . Heart disease Sister   . Heart disease Brother   . Heart disease Sister   . Heart disease Sister     Social History History  Substance Use Topics  . Smoking status: Former Smoker    Quit date: 04/20/1952  . Smokeless tobacco: Not on file  . Alcohol Use: Yes     occasional    Allergies  Allergen Reactions  . Codeine Shortness Of Breath    Current Outpatient Prescriptions  Medication Sig Dispense Refill  . aspirin 81 MG tablet Take 81 mg by mouth daily.      . Calcium Carbonate-Vitamin D (CALCIUM + D PO) Take 600 mg by mouth daily.       . DiphenhydrAMINE HCl, Sleep, (SLEEP-AID MAXIMUM STRENGTH PO) Take by mouth at bedtime as needed and may repeat dose one time if needed.        . hydrochlorothiazide (HYDRODIURIL) 25 MG tablet Take 25  mg by mouth daily.        Marland Kitchen lisinopril (PRINIVIL,ZESTRIL) 2.5 MG tablet Take 2.5 mg by mouth daily.        Review of Systems Review of Systems  Constitutional: Negative for fever, chills and unexpected weight change.  HENT: Positive for hearing loss. Negative for congestion, sore throat, trouble swallowing and voice change.   Eyes: Negative for visual disturbance.  Respiratory: Negative for cough and wheezing.   Cardiovascular: Negative for chest pain, palpitations and leg swelling.  Gastrointestinal: Negative for nausea, vomiting, abdominal pain, diarrhea, constipation, blood in stool, abdominal distention and anal bleeding.  Genitourinary: Negative for hematuria, vaginal bleeding and difficulty urinating.  Musculoskeletal: Negative for arthralgias.  Skin: Negative for rash and wound.  Neurological: Negative for seizures, syncope and headaches.  Hematological: Negative for adenopathy. Does not bruise/bleed easily.  Psychiatric/Behavioral: Negative for confusion.    Blood pressure 152/64, pulse 86, temperature 98.6 F (37 C), temperature source Temporal, height 5\' 8"  (1.727 m), weight 11 lb 9.6 oz (5.262 kg), SpO2 96.00%.  Physical Exam Physical Exam  Vitals reviewed. Constitutional: She appears well-developed and well-nourished.  Pulmonary/Chest: Right breast exhibits mass. Right breast exhibits no inverted nipple, no nipple discharge, no skin change and no tenderness. Left breast exhibits no inverted nipple, no mass, no nipple discharge, no skin change and no tenderness. Breasts are symmetrical.    Lymphadenopathy:    She has no cervical adenopathy.    Data Reviewed MMG reviewed  Assessment    Right breast mass    Plan    She does have a new right breast mass on exam. Will send for u/s and then follow up after that.       Rachael Perez 07/31/2011, 12:21 PM

## 2011-08-05 DIAGNOSIS — Z0189 Encounter for other specified special examinations: Secondary | ICD-10-CM | POA: Diagnosis not present

## 2011-08-05 DIAGNOSIS — N63 Unspecified lump in unspecified breast: Secondary | ICD-10-CM | POA: Diagnosis not present

## 2011-08-06 ENCOUNTER — Other Ambulatory Visit: Payer: Self-pay

## 2011-08-06 DIAGNOSIS — N63 Unspecified lump in unspecified breast: Secondary | ICD-10-CM | POA: Diagnosis not present

## 2011-08-06 DIAGNOSIS — Z0189 Encounter for other specified special examinations: Secondary | ICD-10-CM | POA: Diagnosis not present

## 2011-08-06 DIAGNOSIS — C50919 Malignant neoplasm of unspecified site of unspecified female breast: Secondary | ICD-10-CM | POA: Diagnosis not present

## 2011-08-07 ENCOUNTER — Telehealth (INDEPENDENT_AMBULATORY_CARE_PROVIDER_SITE_OTHER): Payer: Self-pay | Admitting: General Surgery

## 2011-08-07 ENCOUNTER — Other Ambulatory Visit: Payer: Self-pay | Admitting: Radiology

## 2011-08-07 DIAGNOSIS — Z853 Personal history of malignant neoplasm of breast: Secondary | ICD-10-CM | POA: Diagnosis not present

## 2011-08-07 DIAGNOSIS — Z09 Encounter for follow-up examination after completed treatment for conditions other than malignant neoplasm: Secondary | ICD-10-CM | POA: Diagnosis not present

## 2011-08-07 DIAGNOSIS — C50911 Malignant neoplasm of unspecified site of right female breast: Secondary | ICD-10-CM

## 2011-08-07 NOTE — Telephone Encounter (Signed)
Called and left VM for Jessica with appt information:  Friday, August 14, 2011, to arrive at 8:15 for an 8:30 appt with Dr. Dwain Sarna.

## 2011-08-07 NOTE — Telephone Encounter (Signed)
Jessica at [facility] is calling for ASAP appt for this pt; she has appt today at [facility] to be told she has breast Ca.  Shanda Bumps wants to be able to give her the appt with Dr. Dwain Sarna then.  Jessica's direct phone:  [phone].  Thanks.

## 2011-08-10 DIAGNOSIS — I10 Essential (primary) hypertension: Secondary | ICD-10-CM | POA: Diagnosis not present

## 2011-08-10 DIAGNOSIS — M810 Age-related osteoporosis without current pathological fracture: Secondary | ICD-10-CM | POA: Diagnosis not present

## 2011-08-10 DIAGNOSIS — C50919 Malignant neoplasm of unspecified site of unspecified female breast: Secondary | ICD-10-CM | POA: Diagnosis not present

## 2011-08-10 DIAGNOSIS — F411 Generalized anxiety disorder: Secondary | ICD-10-CM | POA: Diagnosis not present

## 2011-08-13 ENCOUNTER — Other Ambulatory Visit: Payer: PRIVATE HEALTH INSURANCE

## 2011-08-14 ENCOUNTER — Telehealth (INDEPENDENT_AMBULATORY_CARE_PROVIDER_SITE_OTHER): Payer: Self-pay

## 2011-08-14 ENCOUNTER — Encounter (INDEPENDENT_AMBULATORY_CARE_PROVIDER_SITE_OTHER): Payer: Medicare Other | Admitting: General Surgery

## 2011-08-14 NOTE — Telephone Encounter (Signed)
Pt returned my call and she was upset b/c she was never told about her appt today with Dr Dwain Sarna. The pt was given her new appt with Dr Dwain Sarna on 08/24/11.

## 2011-08-18 ENCOUNTER — Ambulatory Visit
Admission: RE | Admit: 2011-08-18 | Discharge: 2011-08-18 | Disposition: A | Discharge status: Home / Self Care | Payer: Medicare Other | Source: Ambulatory Visit | Attending: Radiology | Admitting: Radiology

## 2011-08-18 DIAGNOSIS — N63 Unspecified lump in unspecified breast: Secondary | ICD-10-CM | POA: Diagnosis not present

## 2011-08-18 DIAGNOSIS — D0590 Unspecified type of carcinoma in situ of unspecified breast: Secondary | ICD-10-CM | POA: Diagnosis not present

## 2011-08-18 DIAGNOSIS — C50419 Malignant neoplasm of upper-outer quadrant of unspecified female breast: Secondary | ICD-10-CM | POA: Diagnosis not present

## 2011-08-18 DIAGNOSIS — C50911 Malignant neoplasm of unspecified site of right female breast: Secondary | ICD-10-CM

## 2011-08-18 MED ORDER — GADOBENATE DIMEGLUMINE 529 MG/ML IV SOLN
9.0000 mL | Freq: Once | INTRAVENOUS | Status: AC | PRN
  Administered 2011-08-18: 9 mL via INTRAVENOUS

## 2011-08-24 ENCOUNTER — Ambulatory Visit (INDEPENDENT_AMBULATORY_CARE_PROVIDER_SITE_OTHER): Payer: Medicare Other | Admitting: General Surgery

## 2011-08-24 ENCOUNTER — Encounter (INDEPENDENT_AMBULATORY_CARE_PROVIDER_SITE_OTHER): Payer: Self-pay | Admitting: General Surgery

## 2011-08-24 VITALS — BP 142/68 | HR 76 | Temp 97.6°F | Resp 16 | Ht 68.0 in | Wt 113.1 lb

## 2011-08-24 DIAGNOSIS — C50419 Malignant neoplasm of upper-outer quadrant of unspecified female breast: Secondary | ICD-10-CM | POA: Diagnosis not present

## 2011-08-25 NOTE — Progress Notes (Signed)
Patient ID: Rachael Perez, female   DOB: June 13, 1926, 78 y.o.   MRN: [id number]  Chief Complaint  Patient presents with  . Breast Cancer    HPI Rachael Perez is a 78 y.o. female.   HPI 71 yof with history of left breast cancer treated with lumpectomy in 1996.  She has done well since then until recently when I saw her for a new right breast mass.  An ultrasound showed a 1.8 cm lesion near the skin surface adjacent to the nipple at 11 o'clock position.  She underwent u/s guided biopsy.  A mmg on the left breast was negative.  An MRI was also performed which showed a 1.6x1.2x1 cm biopsy proven malignancy in the anterior aspect of the upper outer right breast.  There are post lumpectomy changes on the left.  A biopsy shows invasive lobular carcinoma, er pos at 10%, pr negative, her2 neu negative.    Past Medical History  Diagnosis Date  . Hypertension   . Palpitations   . Anxiety   . SOB (shortness of breath) on exertion   . History of medication noncompliance   . Varicose veins   . Wears hearing aid   . Abnormal chest CT     Lesion is unchanged; Followed by Dr. Truett Perna  . Breast cancer 08/1994    Remote lumpectomy on L  . Arthritis   . Osteoporosis     Past Surgical History  Procedure Date  . Tonsillectomy   . Tubal ligation   . Breast lumpectomy   . Cataract extraction, bilateral   . US echocardiography 12/17/2009    EF 55-60%    Family History  Problem Relation Age of Onset  . Heart disease Mother   . Heart disease Father   . Heart disease Sister   . Heart disease Brother   . Heart disease Sister   . Heart disease Sister     Social History History  Substance Use Topics  . Smoking status: Former Smoker    Quit date: 04/20/1952  . Smokeless tobacco: Not on file  . Alcohol Use: Yes     occasional    Allergies  Allergen Reactions  . Codeine Shortness Of Breath    Current Outpatient Prescriptions  Medication Sig Dispense Refill  . aspirin 81 MG  tablet Take 81 mg by mouth daily.      . Calcium Carbonate-Vitamin D (CALCIUM + D PO) Take 600 mg by mouth daily.       . DiphenhydrAMINE HCl, Sleep, (SLEEP-AID MAXIMUM STRENGTH PO) Take by mouth at bedtime as needed and may repeat dose one time if needed.        . hydrochlorothiazide (HYDRODIURIL) 25 MG tablet Take 25 mg by mouth daily.          Review of Systems Review of Systems  Blood pressure 142/68, pulse 76, temperature 97.6 F (36.4 C), temperature source Temporal, resp. rate 16, height 5\' 8"  (1.727 m), weight 113 lb 2 oz (51.313 kg).  Physical Exam Physical Exam  Vitals reviewed. Constitutional: She appears well-developed and well-nourished.  Cardiovascular: Normal rate, regular rhythm and normal heart sounds.   Pulmonary/Chest: Effort normal and breath sounds normal. She has no wheezes. She has no rales. Right breast exhibits mass. Right breast exhibits no inverted nipple, no nipple discharge, no skin change and no tenderness. Left breast exhibits no inverted nipple, no mass, no nipple discharge, no skin change and no tenderness. Breasts are symmetrical.    Lymphadenopathy:  She has no cervical adenopathy.    She has no axillary adenopathy.       Right: No supraclavicular adenopathy present.       Left: No supraclavicular adenopathy present.     Assessment    Likely stage I right breast cancer    Plan    Right breast wire guided lumpectomy   We discussed the staging and pathophysiology of breast cancer. We discussed all of the different options for treatment for breast cancer including surgery, chemotherapy, radiation therapy, Herceptin, and antiestrogen therapy.   We discussed a sentinel lymph node biopsy in multidisciplinary conference and with Dr. Truett Perna.  We have decided not to proceed with a sentinel node biopsy as the oncologists don't think will change therapy.  Clinically and radiologically she has negative nodes.  We discussed the options for treatment  of the breast cancer which included lumpectomy versus a mastectomy. We discussed the performance of the lumpectomy with a wire placement. We discussed a 10-20% chance of a positive margin requiring reexcision in the operating room. We also discussed that she may need radiation therapy or antiestrogen therapy or both if she undergoes lumpectomy. We discussed the mastectomy and the postoperative care for that as well. We discussed that there is no difference in her survival whether she undergoes lumpectomy with radiation therapy or antiestrogen therapy versus a mastectomy. There is a slight difference in the local recurrence rate being 3-5% with lumpectomy and about 1% with a mastectomy. We discussed the risks of operation including bleeding, infection, possible reoperation. She understands her further therapy will be based on what her stages at the time of her operation. I also discussed genetics with her as recommended from our multidisciplinary conference.  She does not want to do this at this point but will consider later.  I have also discussed plan with her son Dr. Jens Som         Rachael Perez 08/25/2011, 9:03 PM

## 2011-08-27 ENCOUNTER — Encounter (HOSPITAL_BASED_OUTPATIENT_CLINIC_OR_DEPARTMENT_OTHER): Payer: Self-pay | Admitting: *Deleted

## 2011-08-27 NOTE — Progress Notes (Signed)
To come in for labs and ekg To bring meds and overnight bag

## 2011-08-28 ENCOUNTER — Other Ambulatory Visit: Payer: Self-pay

## 2011-08-28 ENCOUNTER — Encounter (HOSPITAL_BASED_OUTPATIENT_CLINIC_OR_DEPARTMENT_OTHER)
Admission: RE | Admit: 2011-08-28 | Discharge: 2011-08-28 | Disposition: A | Discharge status: Home / Self Care | Payer: Medicare Other | Source: Ambulatory Visit | Attending: General Surgery | Admitting: General Surgery

## 2011-08-28 DIAGNOSIS — F411 Generalized anxiety disorder: Secondary | ICD-10-CM | POA: Diagnosis not present

## 2011-08-28 DIAGNOSIS — Z01812 Encounter for preprocedural laboratory examination: Secondary | ICD-10-CM | POA: Diagnosis not present

## 2011-08-28 DIAGNOSIS — M810 Age-related osteoporosis without current pathological fracture: Secondary | ICD-10-CM | POA: Diagnosis not present

## 2011-08-28 DIAGNOSIS — R0602 Shortness of breath: Secondary | ICD-10-CM | POA: Diagnosis not present

## 2011-08-28 DIAGNOSIS — Z01810 Encounter for preprocedural cardiovascular examination: Secondary | ICD-10-CM | POA: Diagnosis not present

## 2011-08-28 DIAGNOSIS — Z853 Personal history of malignant neoplasm of breast: Secondary | ICD-10-CM | POA: Diagnosis not present

## 2011-08-28 DIAGNOSIS — I10 Essential (primary) hypertension: Secondary | ICD-10-CM | POA: Diagnosis not present

## 2011-08-28 DIAGNOSIS — C50919 Malignant neoplasm of unspecified site of unspecified female breast: Secondary | ICD-10-CM | POA: Diagnosis not present

## 2011-08-28 DIAGNOSIS — M129 Arthropathy, unspecified: Secondary | ICD-10-CM | POA: Diagnosis not present

## 2011-08-28 DIAGNOSIS — D0590 Unspecified type of carcinoma in situ of unspecified breast: Secondary | ICD-10-CM | POA: Diagnosis not present

## 2011-08-28 LAB — BASIC METABOLIC PANEL
CO2: 31 mEq/L (ref 19–32)
Chloride: 99 mEq/L (ref 96–112)
Potassium: 4 mEq/L (ref 3.5–5.1)
Sodium: 141 mEq/L (ref 135–145)

## 2011-08-28 LAB — CBC
HCT: 42.1 % (ref 36.0–46.0)
Hemoglobin: 14.4 g/dL (ref 12.0–15.0)
RBC: 4.52 MIL/uL (ref 3.87–5.11)
WBC: 8 10*3/uL (ref 4.0–10.5)

## 2011-08-29 LAB — CANCER ANTIGEN 27.29: CA 27.29: 21 U/mL (ref 0–39)

## 2011-09-01 ENCOUNTER — Encounter (HOSPITAL_BASED_OUTPATIENT_CLINIC_OR_DEPARTMENT_OTHER): Payer: Self-pay | Admitting: *Deleted

## 2011-09-01 ENCOUNTER — Ambulatory Visit (HOSPITAL_BASED_OUTPATIENT_CLINIC_OR_DEPARTMENT_OTHER)
Admission: RE | Admit: 2011-09-01 | Discharge: 2011-09-01 | Disposition: A | Discharge status: Home / Self Care | Payer: Medicare Other | Source: Ambulatory Visit | Attending: General Surgery | Admitting: General Surgery

## 2011-09-01 ENCOUNTER — Encounter (HOSPITAL_BASED_OUTPATIENT_CLINIC_OR_DEPARTMENT_OTHER): Payer: Self-pay | Admitting: Anesthesiology

## 2011-09-01 ENCOUNTER — Encounter (HOSPITAL_BASED_OUTPATIENT_CLINIC_OR_DEPARTMENT_OTHER)
Admission: RE | Disposition: A | Discharge status: Home / Self Care | Payer: Self-pay | Source: Ambulatory Visit | Attending: General Surgery

## 2011-09-01 ENCOUNTER — Ambulatory Visit (HOSPITAL_BASED_OUTPATIENT_CLINIC_OR_DEPARTMENT_OTHER): Payer: Medicare Other | Admitting: Anesthesiology

## 2011-09-01 DIAGNOSIS — Z853 Personal history of malignant neoplasm of breast: Secondary | ICD-10-CM | POA: Diagnosis not present

## 2011-09-01 DIAGNOSIS — Z0189 Encounter for other specified special examinations: Secondary | ICD-10-CM | POA: Diagnosis not present

## 2011-09-01 DIAGNOSIS — D0590 Unspecified type of carcinoma in situ of unspecified breast: Secondary | ICD-10-CM | POA: Diagnosis not present

## 2011-09-01 DIAGNOSIS — C50919 Malignant neoplasm of unspecified site of unspecified female breast: Secondary | ICD-10-CM | POA: Insufficient documentation

## 2011-09-01 DIAGNOSIS — M129 Arthropathy, unspecified: Secondary | ICD-10-CM | POA: Insufficient documentation

## 2011-09-01 DIAGNOSIS — R0602 Shortness of breath: Secondary | ICD-10-CM | POA: Insufficient documentation

## 2011-09-01 DIAGNOSIS — Z01812 Encounter for preprocedural laboratory examination: Secondary | ICD-10-CM | POA: Insufficient documentation

## 2011-09-01 DIAGNOSIS — I10 Essential (primary) hypertension: Secondary | ICD-10-CM | POA: Insufficient documentation

## 2011-09-01 DIAGNOSIS — F411 Generalized anxiety disorder: Secondary | ICD-10-CM | POA: Insufficient documentation

## 2011-09-01 DIAGNOSIS — M810 Age-related osteoporosis without current pathological fracture: Secondary | ICD-10-CM | POA: Insufficient documentation

## 2011-09-01 DIAGNOSIS — Z01810 Encounter for preprocedural cardiovascular examination: Secondary | ICD-10-CM | POA: Insufficient documentation

## 2011-09-01 HISTORY — PX: BREAST LUMPECTOMY: SHX2

## 2011-09-01 SURGERY — BREAST LUMPECTOMY WITH NEEDLE LOCALIZATION
Anesthesia: General | Site: Breast | Laterality: Right | Wound class: Clean

## 2011-09-01 MED ORDER — HYDROMORPHONE HCL PF 1 MG/ML IJ SOLN: 0.2500 mg | INTRAMUSCULAR | Status: DC | PRN

## 2011-09-01 MED ORDER — CEFAZOLIN SODIUM 1-5 GM-% IV SOLN
1.0000 g | INTRAVENOUS | Status: AC
  Administered 2011-09-01: 1 g via INTRAVENOUS

## 2011-09-01 MED ORDER — MIDAZOLAM HCL 2 MG/2ML IJ SOLN
0.5000 mg | INTRAMUSCULAR | Status: DC | PRN
Start: 2011-09-01 — End: 2011-09-01

## 2011-09-01 MED ORDER — BUPIVACAINE HCL (PF) 0.25 % IJ SOLN
INTRAMUSCULAR | Status: DC | PRN
  Administered 2011-09-01: 20 mL

## 2011-09-01 MED ORDER — OXYCODONE-ACETAMINOPHEN 5-325 MG PO TABS: 1.0000 | ORAL_TABLET | ORAL | Status: DC | PRN

## 2011-09-01 MED ORDER — PROPOFOL 10 MG/ML IV EMUL
INTRAVENOUS | Status: DC | PRN
  Administered 2011-09-01: 150 mg via INTRAVENOUS

## 2011-09-01 MED ORDER — LACTATED RINGERS IV SOLN
INTRAVENOUS | Status: DC
  Administered 2011-09-01: 10:00:00 via INTRAVENOUS

## 2011-09-01 MED ORDER — METOCLOPRAMIDE HCL 5 MG/ML IJ SOLN: 10.0000 mg | Freq: Once | INTRAMUSCULAR | Status: DC | PRN

## 2011-09-01 MED ORDER — EPHEDRINE SULFATE 50 MG/ML IJ SOLN
INTRAMUSCULAR | Status: DC | PRN
  Administered 2011-09-01: 10 mg via INTRAVENOUS

## 2011-09-01 MED ORDER — FENTANYL CITRATE 0.05 MG/ML IJ SOLN
INTRAMUSCULAR | Status: DC | PRN
  Administered 2011-09-01: 50 ug via INTRAVENOUS

## 2011-09-01 MED ORDER — ONDANSETRON HCL 4 MG/2ML IJ SOLN
INTRAMUSCULAR | Status: DC | PRN
  Administered 2011-09-01: 4 mg via INTRAVENOUS

## 2011-09-01 MED ORDER — DEXAMETHASONE SODIUM PHOSPHATE 4 MG/ML IJ SOLN
INTRAMUSCULAR | Status: DC | PRN
  Administered 2011-09-01: 5 mg via INTRAVENOUS

## 2011-09-01 MED ORDER — ACETAMINOPHEN 10 MG/ML IV SOLN
1000.0000 mg | Freq: Once | INTRAVENOUS | Status: AC
Start: 2011-09-01 — End: 2011-09-01
  Administered 2011-09-01: 1000 mg via INTRAVENOUS

## 2011-09-01 MED ORDER — METOCLOPRAMIDE HCL 5 MG/ML IJ SOLN
INTRAMUSCULAR | Status: DC | PRN
  Administered 2011-09-01: 10 mg via INTRAVENOUS

## 2011-09-01 MED ORDER — LIDOCAINE HCL (CARDIAC) 20 MG/ML IV SOLN
INTRAVENOUS | Status: DC | PRN
  Administered 2011-09-01: 50 mg via INTRAVENOUS

## 2011-09-01 SURGICAL SUPPLY — 55 items
ADH SKN CLS APL DERMABOND .7 (GAUZE/BANDAGES/DRESSINGS)
APL SKNCLS STERI-STRIP NONHPOA (GAUZE/BANDAGES/DRESSINGS) ×1
APPLIER CLIP 9.375 MED OPEN (MISCELLANEOUS) ×2
APR CLP MED 9.3 20 MLT OPN (MISCELLANEOUS) ×1
BENZOIN TINCTURE PRP APPL 2/3 (GAUZE/BANDAGES/DRESSINGS) ×2 IMPLANT
BINDER BREAST LRG (GAUZE/BANDAGES/DRESSINGS) IMPLANT
BINDER BREAST MEDIUM (GAUZE/BANDAGES/DRESSINGS) ×1 IMPLANT
BINDER BREAST XLRG (GAUZE/BANDAGES/DRESSINGS) IMPLANT
BINDER BREAST XXLRG (GAUZE/BANDAGES/DRESSINGS) IMPLANT
BLADE SURG 15 STRL LF DISP TIS (BLADE) ×1 IMPLANT
BLADE SURG 15 STRL SS (BLADE) ×2
CANISTER SUCTION 1200CC (MISCELLANEOUS) IMPLANT
CHLORAPREP W/TINT 26ML (MISCELLANEOUS) ×2 IMPLANT
CLIP APPLIE 9.375 MED OPEN (MISCELLANEOUS) IMPLANT
CLOTH BEACON ORANGE TIMEOUT ST (SAFETY) ×2 IMPLANT
COVER MAYO STAND STRL (DRAPES) ×2 IMPLANT
COVER TABLE BACK 60X90 (DRAPES) ×2 IMPLANT
DECANTER SPIKE VIAL GLASS SM (MISCELLANEOUS) IMPLANT
DERMABOND ADVANCED (GAUZE/BANDAGES/DRESSINGS)
DERMABOND ADVANCED .7 DNX12 (GAUZE/BANDAGES/DRESSINGS) IMPLANT
DEVICE DUBIN W/COMP PLATE 8390 (MISCELLANEOUS) ×1 IMPLANT
DRAPE PED LAPAROTOMY (DRAPES) ×2 IMPLANT
DRSG TEGADERM 4X4.75 (GAUZE/BANDAGES/DRESSINGS) ×2 IMPLANT
ELECT COATED BLADE 2.86 ST (ELECTRODE) ×2 IMPLANT
ELECT REM PT RETURN 9FT ADLT (ELECTROSURGICAL) ×2
ELECTRODE REM PT RTRN 9FT ADLT (ELECTROSURGICAL) ×1 IMPLANT
GAUZE SPONGE 4X4 12PLY STRL LF (GAUZE/BANDAGES/DRESSINGS) ×2 IMPLANT
GLOVE BIO SURGEON STRL SZ7 (GLOVE) ×2 IMPLANT
GLOVE BIOGEL M STER SZ 6 (GLOVE) ×1 IMPLANT
GLOVE BIOGEL PI IND STRL 7.5 (GLOVE) ×1 IMPLANT
GLOVE BIOGEL PI INDICATOR 7.5 (GLOVE) ×1
GOWN PREVENTION PLUS XLARGE (GOWN DISPOSABLE) ×4 IMPLANT
KIT MARKER MARGIN INK (KITS) ×1 IMPLANT
NDL HYPO 25X1 1.5 SAFETY (NEEDLE) ×1 IMPLANT
NEEDLE HYPO 25X1 1.5 SAFETY (NEEDLE) ×2 IMPLANT
NS IRRIG 1000ML POUR BTL (IV SOLUTION) IMPLANT
PACK BASIN DAY SURGERY FS (CUSTOM PROCEDURE TRAY) ×2 IMPLANT
PENCIL BUTTON HOLSTER BLD 10FT (ELECTRODE) ×2 IMPLANT
SLEEVE SCD COMPRESS KNEE MED (MISCELLANEOUS) ×2 IMPLANT
SPONGE GAUZE 4X4 12PLY (GAUZE/BANDAGES/DRESSINGS) ×1 IMPLANT
SPONGE LAP 4X18 X RAY DECT (DISPOSABLE) ×2 IMPLANT
STRIP CLOSURE SKIN 1/2X4 (GAUZE/BANDAGES/DRESSINGS) ×2 IMPLANT
SUT MNCRL AB 4-0 PS2 18 (SUTURE) ×2 IMPLANT
SUT SILK 2 0 SH (SUTURE) ×2 IMPLANT
SUT VIC AB 2-0 SH 27 (SUTURE) ×2
SUT VIC AB 2-0 SH 27XBRD (SUTURE) ×1 IMPLANT
SUT VIC AB 3-0 SH 27 (SUTURE) ×2
SUT VIC AB 3-0 SH 27X BRD (SUTURE) ×1 IMPLANT
SUT VICRYL AB 3 0 TIES (SUTURE) IMPLANT
SYR CONTROL 10ML LL (SYRINGE) ×2 IMPLANT
TOWEL OR 17X24 6PK STRL BLUE (TOWEL DISPOSABLE) ×2 IMPLANT
TOWEL OR NON WOVEN STRL DISP B (DISPOSABLE) ×2 IMPLANT
TUBE CONNECTING 20X1/4 (TUBING) IMPLANT
WATER STERILE IRR 1000ML POUR (IV SOLUTION) ×2 IMPLANT
YANKAUER SUCT BULB TIP NO VENT (SUCTIONS) IMPLANT

## 2011-09-01 NOTE — Interval H&P Note (Signed)
History and Physical Interval Note:  09/01/2011 10:43 AM  Rachael Perez  has presented today for surgery, with the diagnosis of right breast caner  The various methods of treatment have been discussed with the patient and family. After consideration of risks, benefits and other options for treatment, the patient has consented to  Procedure(s) (LRB): BREAST LUMPECTOMY WITH NEEDLE LOCALIZATION (Right) as a surgical intervention .  The patients' history has been reviewed, patient examined, no change in status, stable for surgery.  I have reviewed the patients' chart and labs.  Questions were answered to the patient's satisfaction.     Derian Pfost

## 2011-09-01 NOTE — Discharge Instructions (Signed)
[facility] °Office Phone Number [phone] ° °BREAST BIOPSY/ PARTIAL MASTECTOMY: POST OP INSTRUCTIONS ° °Always review your discharge instruction sheet given to you by the facility where your surgery was performed. ° °IF YOU HAVE DISABILITY OR FAMILY LEAVE FORMS, YOU MUST BRING THEM TO THE OFFICE FOR PROCESSING.  DO NOT GIVE THEM TO YOUR DOCTOR. ° °1. A prescription for pain medication may be given to you upon discharge.  Take your pain medication as prescribed, if needed.  If narcotic pain medicine is not needed, then you may take acetaminophen (Tylenol), naprosyn (Alleve) or ibuprofen (Advil) as needed. °2. Take your usually prescribed medications unless otherwise directed °3. If you need a refill on your pain medication, please contact your pharmacy.  They will contact our office to request authorization.  Prescriptions will not be filled after 5pm or on week-ends. °4. You should eat very light the first 24 hours after surgery, such as soup, crackers, pudding, etc.  Resume your normal diet the day after surgery. °5. Most patients will experience some swelling and bruising in the breast.  Ice packs and a good support bra will help.  Wear the breast binder provided or a sports bra for 72 hours day and night.  After that wear a sports bra during the day until you return to the office. Swelling and bruising can take several days to resolve.  °6. It is common to experience some constipation if taking pain medication after surgery.  Increasing fluid intake and taking a stool softener will usually help or prevent this problem from occurring.  A mild laxative (Milk of Magnesia or Miralax) should be taken according to package directions if there are no bowel movements after 48 hours. °7. Unless discharge instructions indicate otherwise, you may remove your bandages 48 hours after surgery and you may shower at that time.  You may have steri-strips (small skin tapes) in place directly over the incision.   These strips should be left on the skin for 7-10 days and will come off on their own.  If your surgeon used skin glue on the incision, you may shower in 24 hours.  The glue will flake off over the next 2-3 weeks.  Any sutures or staples will be removed at the office during your follow-up visit. °8. ACTIVITIES:  You may resume regular daily activities (gradually increasing) beginning the next day.  Wearing a good support bra or sports bra minimizes pain and swelling.  You may have sexual intercourse when it is comfortable. °a. You may drive when you no longer are taking prescription pain medication, you can comfortably wear a seatbelt, and you can safely maneuver your car and apply brakes. °b. RETURN TO WORK:  ______________________________________________________________________________________ °9. You should see your doctor in the office for a follow-up appointment approximately two weeks after your surgery.  Your doctor’s nurse will typically make your follow-up appointment when she calls you with your pathology report.  Expect your pathology report 3-4 business days after your surgery.  You may call to check if you do not hear from us after three days. °10. OTHER INSTRUCTIONS: _______________________________________________________________________________________________ _____________________________________________________________________________________________________________________________________ °_____________________________________________________________________________________________________________________________________ °_____________________________________________________________________________________________________________________________________ ° °WHEN TO CALL DR WAKEFIELD: °1. Fever over 101.0 °2. Nausea and/or vomiting. °3. Extreme swelling or bruising. °4. Continued bleeding from incision. °5. Increased pain, redness, or drainage from the incision. ° °The clinic staff is available to  answer your questions during regular business hours.  Please don’t hesitate to call and ask to speak to one of the nurses for   clinical concerns.  If you have a medical emergency, go to the nearest emergency room or call 911.  A surgeon from [facility] is always on call at the hospital. ° °For further questions, please visit [email or web address] ° °Post Anesthesia Home Care Instructions ° °Activity: °Get plenty of rest for the remainder of the day. A responsible adult should stay with you for 24 hours following the procedure.  °For the next 24 hours, DO NOT: °-Drive a car °-Operate machinery °-Drink alcoholic beverages °-Take any medication unless instructed by your physician °-Make any legal decisions or sign important papers. ° °Meals: °Start with liquid foods such as gelatin or soup. Progress to regular foods as tolerated. Avoid greasy, spicy, heavy foods. If nausea and/or vomiting occur, drink only clear liquids until the nausea and/or vomiting subsides. Call your physician if vomiting continues. ° °Special Instructions/Symptoms: °Your throat may feel dry or sore from the anesthesia or the breathing tube placed in your throat during surgery. If this causes discomfort, gargle with warm salt water. The discomfort should disappear within 24 hours. ° °

## 2011-09-01 NOTE — Transfer of Care (Signed)
Immediate Anesthesia Transfer of Care Note  Patient: Rachael Perez  Procedure(s) Performed: Procedure(s) (LRB): BREAST LUMPECTOMY WITH NEEDLE LOCALIZATION (Right)  Patient Location: PACU  Anesthesia Type: General  Level of Consciousness: awake  Airway & Oxygen Therapy: Patient Spontanous Breathing and Patient connected to face mask oxygen  Post-op Assessment: Report given to PACU RN and Post -op Vital signs reviewed and stable  Post vital signs: Reviewed and stable  Complications: No apparent anesthesia complications

## 2011-09-01 NOTE — Anesthesia Postprocedure Evaluation (Signed)
Anesthesia Post Note  Patient: Rachael Perez  Procedure(s) Performed: Procedure(s) (LRB): BREAST LUMPECTOMY WITH NEEDLE LOCALIZATION (Right)  Anesthesia type: General  Patient location: PACU  Post pain: Pain level controlled  Post assessment: Patient's Cardiovascular Status Stable  Last Vitals:  Filed Vitals:   09/01/11 1245  BP: 141/51  Pulse: 62  Temp:   Resp: 15    Post vital signs: Reviewed and stable  Level of consciousness: alert  Complications: No apparent anesthesia complications

## 2011-09-01 NOTE — Anesthesia Preprocedure Evaluation (Signed)
Anesthesia Evaluation  Patient identified by MRN, date of birth, ID band Patient awake    Reviewed: Allergy & Precautions, H&P , NPO status , Patient's Chart, lab work & pertinent test results, reviewed documented beta blocker date and time   Airway Mallampati: II TM Distance: >3 FB Neck ROM: full    Dental   Pulmonary shortness of breath and with exertion,          Cardiovascular hypertension, On Medications     Neuro/Psych negative neurological ROS  negative psych ROS   GI/Hepatic negative GI ROS, Neg liver ROS,   Endo/Other  negative endocrine ROS  Renal/GU negative Renal ROS  negative genitourinary   Musculoskeletal   Abdominal   Peds  Hematology negative hematology ROS (+)   Anesthesia Other Findings See surgeon's H&P   Reproductive/Obstetrics negative OB ROS                           Anesthesia Physical Anesthesia Plan  ASA: II  Anesthesia Plan: General   Post-op Pain Management:    Induction: Intravenous  Airway Management Planned: LMA  Additional Equipment:   Intra-op Plan:   Post-operative Plan: Extubation in OR  Informed Consent: I have reviewed the patients History and Physical, chart, labs and discussed the procedure including the risks, benefits and alternatives for the proposed anesthesia with the patient or authorized representative who has indicated his/her understanding and acceptance.   Dental Advisory Given and Dental advisory given  Plan Discussed with: CRNA and Surgeon  Anesthesia Plan Comments:         Anesthesia Quick Evaluation

## 2011-09-01 NOTE — Anesthesia Procedure Notes (Signed)
Procedure Name: LMA Insertion Performed by: Helmuth Recupero W Pre-anesthesia Checklist: Patient identified, Timeout performed, Emergency Drugs available, Suction available and Patient being monitored Patient Re-evaluated:Patient Re-evaluated prior to inductionOxygen Delivery Method: Circle system utilized Preoxygenation: Pre-oxygenation with 100% oxygen Intubation Type: IV induction Ventilation: Mask ventilation without difficulty LMA: LMA inserted LMA Size: 3.0 Number of attempts: 1 Placement Confirmation: breath sounds checked- equal and bilateral and positive ETCO2 Tube secured with: Tape Dental Injury: Teeth and Oropharynx as per pre-operative assessment      

## 2011-09-01 NOTE — H&P (View-Only) (Signed)
Patient ID: Rachael Perez, female   DOB: 04/30/1926, 78 y.o.   MRN: [id number]  Chief Complaint  Patient presents with  . Breast Cancer    HPI Rachael Perez is a 78 y.o. female.   HPI 85 yof with history of left breast cancer treated with lumpectomy in 1996.  She has done well since then until recently when I saw her for a new right breast mass.  An ultrasound showed a 1.8 cm lesion near the skin surface adjacent to the nipple at 11 o'clock position.  She underwent u/s guided biopsy.  A mmg on the left breast was negative.  An MRI was also performed which showed a 1.6x1.2x1 cm biopsy proven malignancy in the anterior aspect of the upper outer right breast.  There are post lumpectomy changes on the left.  A biopsy shows invasive lobular carcinoma, er pos at 10%, pr negative, her2 neu negative.    Past Medical History  Diagnosis Date  . Hypertension   . Palpitations   . Anxiety   . SOB (shortness of breath) on exertion   . History of medication noncompliance   . Varicose veins   . Wears hearing aid   . Abnormal chest CT     Lesion is unchanged; Followed by Dr. Sherrill  . Breast cancer 08/1994    Remote lumpectomy on L  . Arthritis   . Osteoporosis     Past Surgical History  Procedure Date  . Tonsillectomy   . Tubal ligation   . Breast lumpectomy   . Cataract extraction, bilateral   . Us echocardiography 12/17/2009    EF 55-60%    Family History  Problem Relation Age of Onset  . Heart disease Mother   . Heart disease Father   . Heart disease Sister   . Heart disease Brother   . Heart disease Sister   . Heart disease Sister     Social History History  Substance Use Topics  . Smoking status: Former Smoker    Quit date: 04/20/1952  . Smokeless tobacco: Not on file  . Alcohol Use: Yes     occasional    Allergies  Allergen Reactions  . Codeine Shortness Of Breath    Current Outpatient Prescriptions  Medication Sig Dispense Refill  . aspirin 81 MG  tablet Take 81 mg by mouth daily.      . Calcium Carbonate-Vitamin D (CALCIUM + D PO) Take 600 mg by mouth daily.       . DiphenhydrAMINE HCl, Sleep, (SLEEP-AID MAXIMUM STRENGTH PO) Take by mouth at bedtime as needed and may repeat dose one time if needed.        . hydrochlorothiazide (HYDRODIURIL) 25 MG tablet Take 25 mg by mouth daily.          Review of Systems Review of Systems  Blood pressure 142/68, pulse 76, temperature 97.6 F (36.4 C), temperature source Temporal, resp. rate 16, height 5' 8" (1.727 m), weight 113 lb 2 oz (51.313 kg).  Physical Exam Physical Exam  Vitals reviewed. Constitutional: She appears well-developed and well-nourished.  Cardiovascular: Normal rate, regular rhythm and normal heart sounds.   Pulmonary/Chest: Effort normal and breath sounds normal. She has no wheezes. She has no rales. Right breast exhibits mass. Right breast exhibits no inverted nipple, no nipple discharge, no skin change and no tenderness. Left breast exhibits no inverted nipple, no mass, no nipple discharge, no skin change and no tenderness. Breasts are symmetrical.    Lymphadenopathy:      She has no cervical adenopathy.    She has no axillary adenopathy.       Right: No supraclavicular adenopathy present.       Left: No supraclavicular adenopathy present.     Assessment    Likely stage I right breast cancer    Plan    Right breast wire guided lumpectomy   We discussed the staging and pathophysiology of breast cancer. We discussed all of the different options for treatment for breast cancer including surgery, chemotherapy, radiation therapy, Herceptin, and antiestrogen therapy.   We discussed a sentinel lymph node biopsy in multidisciplinary conference and with Dr. Sherrill.  We have decided not to proceed with a sentinel node biopsy as the oncologists don't think will change therapy.  Clinically and radiologically she has negative nodes.  We discussed the options for treatment  of the breast cancer which included lumpectomy versus a mastectomy. We discussed the performance of the lumpectomy with a wire placement. We discussed a 10-20% chance of a positive margin requiring reexcision in the operating room. We also discussed that she may need radiation therapy or antiestrogen therapy or both if she undergoes lumpectomy. We discussed the mastectomy and the postoperative care for that as well. We discussed that there is no difference in her survival whether she undergoes lumpectomy with radiation therapy or antiestrogen therapy versus a mastectomy. There is a slight difference in the local recurrence rate being 3-5% with lumpectomy and about 1% with a mastectomy. We discussed the risks of operation including bleeding, infection, possible reoperation. She understands her further therapy will be based on what her stages at the time of her operation. I also discussed genetics with her as recommended from our multidisciplinary conference.  She does not want to do this at this point but will consider later.  I have also discussed plan with her son Dr. Robert Demartin         Jaken Fregia 08/25/2011, 9:03 PM    

## 2011-09-01 NOTE — Op Note (Signed)
Preoperative diagnosis: Clinical stage I right breast cancer Postoperative diagnosis: Same as above Procedure: Right breast wire-guided lumpectomy Surgeon: Dr. Harden Mo Anesthesia: Gen. With LMA Specimens: Right breast tissue marked with paint kit Drains: None Estimated blood loss: Minimal Consultations: None Sponge and needle count correct x2 at end of operation Disposition to recovery in stable condition  Indications: This is an 78 year old female with a history of left breast cancer who had a palpable right breast mass. This underwent evaluation and she was found to have an invasive ductal carcinoma. She and I discussed her options and we decided on a lumpectomy. We discussed the risks and benefits of that prior to beginning. I discussed her at multidisciplinary conference as well as with her oncologist we decided not to do a sentinel lymph node biopsy.  Procedure: After informed consent was obtained the patient was first taken to the breast center where she had a wire placed. She was then brought to day surgery. She was administered 1 g of intravenous cefazolin. I had the mammograms available for my review in the operating room. Sequential compression devices were placed on her legs. She was then placed under general anesthesia with an LMA. Her right breast was prepped and draped in the standard sterile surgical fashion. A surgical timeout was performed.  I could palpate the mass quite easily. I did use the wire for guidance. I made a curvilinear incision along the areolar border. I then used cautery to remove the wire as well as the mass and the surrounding tissue. Grossly my margins appeared to be clear. Her anterior margin is the skin and the posterior margin is the pectoralis muscle. I obtained hemostasis. I marked the specimen with the patient kit. I then obtained a Faxitron mammogram which showed the wire and the mass but I did not see the clip. I clearly had removed the mass and I  think a clip made just been disengaged from the mass at some point. I discussed this with the radiologist who agreed with that as well. There was no other abnormality in the breast. I then placed 2 clips deep and one clip in each cardinal position. I then closed the deep tissue with 2-0 Vicryl. I closed the dermis with 3-0 Vicryl and the skin with 4-0 Monocryl. I then infiltrated quarter percent Marcaine throughout this field. Steri-Strips and a sterile dressing were placed. A breast binder was also placed. She was extubated the operating room and transferred to recovery in stable condition.

## 2011-09-08 ENCOUNTER — Ambulatory Visit (INDEPENDENT_AMBULATORY_CARE_PROVIDER_SITE_OTHER): Payer: Medicare Other | Admitting: General Surgery

## 2011-09-08 ENCOUNTER — Encounter (INDEPENDENT_AMBULATORY_CARE_PROVIDER_SITE_OTHER): Payer: Self-pay | Admitting: General Surgery

## 2011-09-08 VITALS — BP 136/80 | HR 76 | Resp 20 | Ht 68.0 in | Wt 111.0 lb

## 2011-09-08 DIAGNOSIS — Z09 Encounter for follow-up examination after completed treatment for conditions other than malignant neoplasm: Secondary | ICD-10-CM

## 2011-09-08 NOTE — Progress Notes (Signed)
Subjective:     Patient ID: Rachael Perez, female   DOB: 27-Jun-1926, 78 y.o.   MRN: [id number]  HPI This is an 78 year old female who has a prior left breast cancer in the past. She was being seen by her medical oncologist in followup and felt a right breast mass. I saw her  for an evaluation. She ended up undergoing a right breast lumpectomy after discussion with the multidisciplinary conference. She has done well from this he returns today without any complaints. Her pathology shows a 1.7 cm grade 2 invasive lobular carcinoma. There is no lymphovascular invasion identified. She has invasive tumor 0.1 mm from the inferior margin. Additionally there is lobular carcinoma in situ present and this is focally present at the inferior, posterior, and medial margins. Her receptors previously showed a 10% positivity with estrogen and a 0% positivity with progesterone. The proliferation index was 12% and her HER-2/neu is not amplified.  Review of Systems     Objective:   Physical Exam Right breast incision healing well without infection, mild ecchymosis    Assessment:     Right breast cancer s/p lumpectomy    Plan:     She is doing very well after surgery and I returned her normal activity today.  I had discussion with her I will also call her son who is a Careers adviser in [place]. The tumor is out but she does have a very close margin. We discussed the possibility of further surgery. I do think she will need more therapy on this breast and this may be a combination of her surgery and radiation therapy. She does not have a very good option with the entire hormonal therapy due to the fact that her estrogen receptors only +10%. I think it makes it more important to ensure that we have good local control of her disease. I am going to plan on sending her to the radiation oncologist and then we can discuss what her possible treatments are going to be. I think removing a lot more breast tissue with her  is going to certainly give her a poor cosmetic result and eventually would end up being almost a mastectomy due to her small breast size.

## 2011-09-09 ENCOUNTER — Ambulatory Visit
Admission: RE | Admit: 2011-09-09 | Discharge: 2011-09-09 | Disposition: A | Discharge status: Home / Self Care | Payer: Medicare Other | Source: Ambulatory Visit | Attending: Radiation Oncology | Admitting: Radiation Oncology

## 2011-09-09 ENCOUNTER — Encounter: Payer: Self-pay | Admitting: Radiation Oncology

## 2011-09-09 VITALS — BP 156/73 | HR 86 | Temp 98.1°F | Resp 18 | Ht 68.0 in | Wt 113.3 lb

## 2011-09-09 DIAGNOSIS — L259 Unspecified contact dermatitis, unspecified cause: Secondary | ICD-10-CM | POA: Diagnosis not present

## 2011-09-09 DIAGNOSIS — M542 Cervicalgia: Secondary | ICD-10-CM | POA: Insufficient documentation

## 2011-09-09 DIAGNOSIS — Z510 Encounter for antineoplastic radiation therapy: Secondary | ICD-10-CM | POA: Insufficient documentation

## 2011-09-09 DIAGNOSIS — Z87891 Personal history of nicotine dependence: Secondary | ICD-10-CM | POA: Insufficient documentation

## 2011-09-09 DIAGNOSIS — M25519 Pain in unspecified shoulder: Secondary | ICD-10-CM | POA: Insufficient documentation

## 2011-09-09 DIAGNOSIS — C50419 Malignant neoplasm of upper-outer quadrant of unspecified female breast: Secondary | ICD-10-CM

## 2011-09-09 DIAGNOSIS — I10 Essential (primary) hypertension: Secondary | ICD-10-CM | POA: Diagnosis not present

## 2011-09-09 NOTE — Progress Notes (Signed)
Patient presents to the clinic today unaccompanied for a consultation with Dr. Michell Heinrich. Patient is alert and oriented to person, place, and time. No distress noted. Steady gait noted. Pleasant affect noted. Extreme anxiety noted. Patient denies pain at this time. Patient denies right breast pain. Patient reports that her right breast is "healing well and feels normal" without redness, warmth or edema. Patient denies nausea, vomiting, headache, or diarrhea. Patient expressed several times she is alone and lives alone. Reported all findings to Dr. Michell Heinrich.   Patient born in [place]. 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 Also, she lived in [place] before moving to [place] where she has resided for last 50 years. She was married to her husband for 50 years before he passed (8 year ago). Shortly after her husband passed so did her son (4 years ago).    PCP [facility]//Tiffany L. Renato Gails, DO [phone]

## 2011-09-09 NOTE — Progress Notes (Signed)
Please see progress note under physician encounter 

## 2011-09-09 NOTE — Progress Notes (Signed)
78 year old female.   Dr. Truett Perna referred this patient to Dr. Dwain Sarna for evaluation after feeling a right breast mass. Ultrasound showed a 1.8 cm lesion near the skin surface adjacent to the nipple at 11. Ultrasound guided biopsy was performed then, an MRI. 1.6x1.2x1 cm biopsy proven malignancy at the anterior aspect of the upper outer right breast ER +, PR -, and HER 2 -  was confirmed. Right breast wire guided lumpectomy was done 09/01/11 and her pathology shows a grade 2 invasive lobular carcinoma. Dr. Doreen Salvage note indicates the tumor is out but, the margins are close. Dr. Dwain Sarna referred patient to Dr. Michell Heinrich to discuss her possible treatment options and local control of her disease.   Ax: Codeine causes shortness of breath Left breast ca in 1996 tx.lumpectomy, xrt and tamoxifen No indication of a pacemaker

## 2011-09-10 ENCOUNTER — Encounter (INDEPENDENT_AMBULATORY_CARE_PROVIDER_SITE_OTHER): Payer: Self-pay

## 2011-09-10 NOTE — Progress Notes (Signed)
Radiation Oncology         [phone] ________________________________  Initial outpatient Consultation  Name: Rachael Perez MRN: [id number]  Date: 09/09/2011  DOB: April 12, 1927  WG:NFAO, TIFFANY, DO, DO  Emelia Loron, MD   REFERRING PHYSICIAN: Emelia Loron, MD  DIAGNOSIS: The encounter diagnosis was Cancer of upper-outer quadrant of female breast.  HISTORY OF PRESENT ILLNESS::Rachael Perez is a 78 y.o. female who is  referred today for consideration of radiation in the management of her newly diagnosed breast cancer. The patient has been followed for many years after being treated for a left breast cancer in 1996 by Dr. Truett Perna and Dr. good child she palpated a right breast mass and presented for ultrasound and mammogram. This showed a 1.8 cm lesion in the upper inner quadrant of the right breast. Ultrasound guided biopsy showed a invasive mammary carcinoma. She was referred to Dr. Dwain Sarna for surgical consideration and underwent a lump right lumpectomy on 09/01/2011. A 1.7 cm invasive lobular carcinoma was noted with no lymphovascular invasion. A close inferior margin was noted at 0.1 cm. Lobular carcinoma in situ was focally present at the inferior posterior and medial margins. Estrogen receptor was positive at 10% progesterone at 0%. He reports healing well from her surgery. She has been seen by Dr. Dwain Sarna and referred on for radiation.  He is going to see Dr. Truett Perna back but is really not interested at all in any sort of hormonal therapy or chemotherapy.  PREVIOUS RADIATION THERAPY: Yes, 59.4 gray completed 12/01/1994 to the left breast  PAST MEDICAL HISTORY:  has a past medical history of Hypertension; Palpitations; SOB (shortness of breath) on exertion; History of medication noncompliance; Varicose veins; Wears hearing aid; Abnormal chest CT; Osteoporosis; Anxiety; Breast cancer (08/1994); Breast cancer (2013); and Arthritis.    PAST SURGICAL  HISTORY: Past Surgical History  Procedure Date  . Tonsillectomy   . Tubal ligation   . Breast lumpectomy 09/01/2011  . Cataract extraction, bilateral   . US echocardiography 12/17/2009    EF 55-60%  . Breast lumpectomy 1996    left breast    FAMILY HISTORY: family history includes Heart disease in her brother, father, mother, and sisters.  There is no history of Cancer.  SOCIAL HISTORY:  reports that she quit smoking about 59 years ago. Her smoking use included Cigarettes. She has never used smokeless tobacco. She reports that she drinks alcohol. She reports that she does not use illicit drugs.  ALLERGIES: Codeine  MEDICATIONS:  Current Outpatient Prescriptions  Medication Sig Dispense Refill  . aspirin 81 MG tablet Take 81 mg by mouth daily.      . Calcium Carbonate-Vitamin D (CALCIUM + D PO) Take 600 mg by mouth daily.       . DiphenhydrAMINE HCl, Sleep, (SLEEP-AID MAXIMUM STRENGTH PO) Take by mouth at bedtime as needed and may repeat dose one time if needed.        . hydrochlorothiazide (HYDRODIURIL) 25 MG tablet Take 25 mg by mouth daily.          REVIEW OF SYSTEMS:  A 15 point review of systems is documented in the electronic medical record. This was obtained by the nursing staff. However, I reviewed this with the patient to discuss relevant findings and make appropriate changes.  Pertinent items are noted in HPI.   PHYSICAL EXAM:  height is 5\' 8"  (1.727 m) and weight is 113 lb 4.8 oz (51.393 kg). Her oral temperature is 98.1 F (36.7 C). Her  blood pressure is 156/73 and her pulse is 86. Her respiration is 18.   She is a pleasant female in no distress sitting comfortably examining table. She appears her stated age. She has a healing incision on her right breast. No signs of infection. She has small breasts bilaterally.  LABORATORY DATA:  Lab Results  Component Value Date   WBC 8.0 08/28/2011   HGB 14.4 08/28/2011   HCT 42.1 08/28/2011   MCV 93.1 08/28/2011   PLT 223 08/28/2011    Lab Results  Component Value Date   NA 141 08/28/2011   K 4.0 08/28/2011   CL 99 08/28/2011   CO2 31 08/28/2011   Lab Results  Component Value Date   ALT 15 12/08/2010   AST 27 12/08/2010   ALKPHOS 54 12/08/2010   BILITOT 0.9 12/08/2010     RADIOGRAPHY: Mr Breast Bilateral W Wo Contrast  08/18/2011  *RADIOLOGY REPORT*  Clinical Data: Recently diagnosed right breast invasive mammary carcinoma and mammary carcinoma in situ.  Status post left lumpectomy and radiation therapy for breast cancer in 1997.  The patient has multiple mass-like areas in both lungs, followed with CT, the most recent dated 06/01/2011.  BUN and creatinine were obtained on site at [facility] at [address] Results:  BUN 27 mg/dL,  Creatinine 0.8 mg/dL.  BILATERAL BREAST MRI WITH AND WITHOUT CONTRAST  Technique: Multiplanar, multisequence MR images of both breasts were obtained prior to and following the intravenous administration of 9ml of MultiHance.  Three dimensional images were evaluated at the independent DynaCad workstation.  Comparison:  Recent mammogram, ultrasound and biopsy examinations at [facility].  Chest CT dated 06/01/2011 at [facility].  Findings: Mild background parenchymal enhancement in both breasts. Post lumpectomy changes on the left.  Rounded mass with mildly irregular and lobulated margins in the anterior aspect of the upper outer right breast.  This is superior and lateral to a biopsy marker clip artifact in the upper inner right breast.  This mass has predominately plateau and persistent enhancement kinetics and measures 1.6 x 1.2 x 1.0 cm in maximum dimensions.  There is a biopsy tract within the mass.  No additional masses or areas of enhancement suspicious for malignancy in either breast.  No abnormal appearing lymph nodes. There are multiple mass-like areas in both lungs, similar to the recent CT.  There is also biapical pleural and parenchymal scarring.  IMPRESSION:  1.   1.6 x 1.2 x 1.0 cm biopsy-proven malignancy in the anterior aspect of the upper outer right breast. 2.  No evidence of malignancy elsewhere in either breast. 3.  Post lumpectomy changes on the left. 4.  Multiple mass-like areas in both lungs, being followed by CT.  THREE-DIMENSIONAL MR IMAGE RENDERING ON INDEPENDENT WORKSTATION:  Three-dimensional MR images were rendered by post-processing of the original MR data on an independent workstation.  The three- dimensional MR images were interpreted, and findings were reported in the accompanying complete MRI report for this study.  BI-RADS CATEGORY 6:  Known biopsy-proven malignancy - appropriate action should be taken.  Recommendation:  Treatment plan.  Original Report Authenticated By: Darrol Angel, M.D.      IMPRESSION: T1 N0 invasive lobular carcinoma of the right breast weakly estrogen receptor positive in the setting of a previous left breast cancer  PLAN: I talked with Ms. Hagey today. The main issue is concerning her close margin. I think according to the NSABP  she has negative margins and  can proceed on with treatment. I think it would be fine just to provide [place] hypofractionated radiation with 16 treatments. We discussed this and the process of simulation and the placement of tattoos. She has a wedding on June 8 that she would like to attend and  would like to start her treatment after that. I think that would be fine. Her surgery was just May 14 so she is a little bit early. She is minimally estrogen receptor positive and not excited about taking any pills so I think we can provide her with the most local control and the least amount of side effects by proceeding on with radiation at this time. We discussed the effects of treatment including but not limited to lung damage skin redness and fatigue. I spent 60 minutes face to face with the patient and more than 50% of that time was spent in counseling and/or coordination of care.    ------------------------------------------------  Lurline Hare, MD

## 2011-09-10 NOTE — Progress Notes (Signed)
Late entry from 09/09/2011 1524. Complete NUTRITION RISK SCREEN worksheet submitted to Zenovia Jarred, RD without concerns. Also, complete PATIENT MEASURE OF DISTRESS worksheet with a score of 9 submitted to social work. Spoke with Abagail, LSW via phone to discuss patient's case. Abagail plans to contact this patient reference her feelings of being alone.

## 2011-09-11 NOTE — Progress Notes (Signed)
Encounter addended by: Agnes Lawrence, RN on: 09/11/2011  5:09 PM<BR>     Documentation filed: Charges VN, Inpatient Document Flowsheet

## 2011-09-11 NOTE — Progress Notes (Signed)
Encounter addended by: Agnes Lawrence, RN on: 09/11/2011  5:14 PM<BR>     Documentation filed: Inpatient Patient Education

## 2011-09-14 NOTE — Progress Notes (Signed)
  She needs appt. In June, 45 minutes.Schedule week of  6/15

## 2011-09-17 ENCOUNTER — Ambulatory Visit (INDEPENDENT_AMBULATORY_CARE_PROVIDER_SITE_OTHER): Payer: Medicare Other | Admitting: General Surgery

## 2011-09-17 ENCOUNTER — Encounter (INDEPENDENT_AMBULATORY_CARE_PROVIDER_SITE_OTHER): Payer: Self-pay | Admitting: General Surgery

## 2011-09-17 VITALS — BP 142/60 | HR 77 | Temp 98.6°F | Ht 68.0 in | Wt 110.8 lb

## 2011-09-17 DIAGNOSIS — Z09 Encounter for follow-up examination after completed treatment for conditions other than malignant neoplasm: Secondary | ICD-10-CM

## 2011-09-17 NOTE — Progress Notes (Signed)
Subjective:     Patient ID: Rachael Perez, female   DOB: 02-Apr-1927, 78 y.o.   MRN: [id number]  HPI 78 yof s/p right breast lumpectomy with close but negative margin.  She has been seen by rad onc and is due to begin radiation therapy in early June.  She comes in today without any complaints. Review of Systems     Objective:   Physical Exam Healing right breast incision without infection    Assessment:     Right breast cancer s/p lumpectomy    Plan:     We have discussed her as multidisciplinary team and decided proceeding with radiation is reasonable.  I have discussed with patient and her son Dr. Mickie Kay as well.  She is ok to begin from my standpoint.  i will see her after radiation therapy.

## 2011-09-18 ENCOUNTER — Encounter: Payer: Self-pay | Admitting: *Deleted

## 2011-09-18 NOTE — Progress Notes (Signed)
[facility] Psychosocial Distress Screening Clinical Social Work  Clinical Social Work was referred by Charity fundraiser and distress screening protocol.  The patient scored a 9 on the Psychosocial Distress Thermometer which indicates severe distress. Clinical Social Worker contacted the patient at home to assess for distress and other psychosocial needs.  Pt stated she was doing well and experiencing no distress at this time.  Pt was positive and in good spirits.  Pt currently lives alone, and expressed interest in home case assistance.  Pt stated she had some contact information for a home care agency that she planned to call.  CSW offered pt support with locating home care agencies or other resources that may offer assistance.  CSW also informed pt of support services at [facility], and encouraged pt to call with any needs or concerns.  Pt has 2 sons that currently live out of town, but are a positive support system.  Pt stated that her children want to assist her as much as possible, but it is difficult due to location.  Pt stated she was appreciative of her children support, but knows they have their own lives and obligations which makes it difficult to accept their help.  CSW allowed pt to express her feelings and acknowledged the difficulties of asking for help.  Pt was very appreciative of CSW contact.  CSW and pt are scheduled to meet after her appointment on 09/29/11.          Clinical Social Worker follow up needed: yes If yes, follow up plan: CSW and pt are scheduled to meet after her appointment on 09/29/11.          Tamala Julian, MSW, LCSW Clinical Social Worker [facility] [phone]

## 2011-09-21 ENCOUNTER — Encounter: Payer: Self-pay | Admitting: Oncology

## 2011-09-21 DIAGNOSIS — T148 Other injury of unspecified body region: Secondary | ICD-10-CM | POA: Diagnosis not present

## 2011-09-21 DIAGNOSIS — M810 Age-related osteoporosis without current pathological fracture: Secondary | ICD-10-CM | POA: Diagnosis not present

## 2011-09-21 DIAGNOSIS — F411 Generalized anxiety disorder: Secondary | ICD-10-CM | POA: Diagnosis not present

## 2011-09-21 DIAGNOSIS — W57XXXA Bitten or stung by nonvenomous insect and other nonvenomous arthropods, initial encounter: Secondary | ICD-10-CM | POA: Diagnosis not present

## 2011-09-21 DIAGNOSIS — C50919 Malignant neoplasm of unspecified site of unspecified female breast: Secondary | ICD-10-CM | POA: Diagnosis not present

## 2011-09-21 DIAGNOSIS — S1096XA Insect bite of unspecified part of neck, initial encounter: Secondary | ICD-10-CM | POA: Diagnosis not present

## 2011-09-23 ENCOUNTER — Encounter (INDEPENDENT_AMBULATORY_CARE_PROVIDER_SITE_OTHER): Payer: Self-pay

## 2011-09-29 ENCOUNTER — Ambulatory Visit
Admission: RE | Admit: 2011-09-29 | Discharge: 2011-09-29 | Disposition: A | Discharge status: Home / Self Care | Payer: Medicare Other | Source: Ambulatory Visit | Attending: Radiation Oncology | Admitting: Radiation Oncology

## 2011-09-29 DIAGNOSIS — M542 Cervicalgia: Secondary | ICD-10-CM | POA: Diagnosis not present

## 2011-09-29 DIAGNOSIS — C50419 Malignant neoplasm of upper-outer quadrant of unspecified female breast: Secondary | ICD-10-CM | POA: Diagnosis not present

## 2011-09-29 DIAGNOSIS — Z510 Encounter for antineoplastic radiation therapy: Secondary | ICD-10-CM | POA: Diagnosis not present

## 2011-09-29 DIAGNOSIS — Z87891 Personal history of nicotine dependence: Secondary | ICD-10-CM | POA: Diagnosis not present

## 2011-09-29 DIAGNOSIS — I10 Essential (primary) hypertension: Secondary | ICD-10-CM | POA: Diagnosis not present

## 2011-09-29 DIAGNOSIS — M25519 Pain in unspecified shoulder: Secondary | ICD-10-CM | POA: Diagnosis not present

## 2011-09-29 DIAGNOSIS — C50919 Malignant neoplasm of unspecified site of unspecified female breast: Secondary | ICD-10-CM | POA: Diagnosis not present

## 2011-09-29 NOTE — Progress Notes (Signed)
Met with patient to discuss RO billing. Pt did have a lot of financial concerns, as well as, very emotional (cries) about her situation and finances at this time. Will refer pt to onsite SW's. Pt also completed the EPP application and provided income, but will bring bank  information back.  Dx: 174.4 Upper-outer quadrant of breast  Attending Rad:  Dr. Iona Hansen Tx:  [id number] Extrl Beam

## 2011-09-29 NOTE — Progress Notes (Signed)
Name: Rachael Perez   MRN: [id number]  Date:  09/29/2011  DOB: [birth year]  Status:outpatient    DIAGNOSIS: Breast cancer.  CONSENT VERIFIED: yes   SET UP: Patient is setup supine   IMMOBILIZATION:  The following immobilization was used:Custom Moldable Pillow, breast board.   NARRATIVE: [name] was brought to the CT Simulation planning suite.  Identity was confirmed.  All relevant records and images related to the planned course of therapy were reviewed.  Then, the patient was positioned in a stable reproducible clinical set-up for radiation therapy.  Wires were placed to delineate the clinical extent of breast tissue. A wire was placed on the scar as well.  BBs were placed on her previous tattoos. CT images were obtained.  An isocenter was placed. Skin markings were placed.  The CT images were loaded into the planning software where the target and avoidance structures were contoured.  The radiation prescription was entered and confirmed. The patient was discharged in stable condition and tolerated simulation well.    TREATMENT PLANNING NOTE:  Treatment planning then occurred. I have requested : MLC's, isodose plan, basic dose calculation

## 2011-10-02 ENCOUNTER — Encounter: Payer: Self-pay | Admitting: Radiation Oncology

## 2011-10-02 DIAGNOSIS — C50419 Malignant neoplasm of upper-outer quadrant of unspecified female breast: Secondary | ICD-10-CM | POA: Diagnosis not present

## 2011-10-02 DIAGNOSIS — M25519 Pain in unspecified shoulder: Secondary | ICD-10-CM | POA: Diagnosis not present

## 2011-10-02 DIAGNOSIS — C50919 Malignant neoplasm of unspecified site of unspecified female breast: Secondary | ICD-10-CM | POA: Diagnosis not present

## 2011-10-02 DIAGNOSIS — Z510 Encounter for antineoplastic radiation therapy: Secondary | ICD-10-CM | POA: Diagnosis not present

## 2011-10-02 DIAGNOSIS — Z87891 Personal history of nicotine dependence: Secondary | ICD-10-CM | POA: Diagnosis not present

## 2011-10-02 DIAGNOSIS — M542 Cervicalgia: Secondary | ICD-10-CM | POA: Diagnosis not present

## 2011-10-02 DIAGNOSIS — I10 Essential (primary) hypertension: Secondary | ICD-10-CM | POA: Diagnosis not present

## 2011-10-02 NOTE — Progress Notes (Unsigned)
EPP approved - 15% discount Family Size [facility] [employer] 21,694.80 MOD POV [id number],118.25 Valid Dates 10/02/11-04/02/12  15% INDIGENT - PLEASE APPLY DISCOUNT TO ANY PRIOR AND ALL CURRENT BILL DOS.  [facility] $600 - Breast Only [facility] $400

## 2011-10-06 ENCOUNTER — Ambulatory Visit
Admission: RE | Admit: 2011-10-06 | Discharge: 2011-10-06 | Disposition: A | Discharge status: Home / Self Care | Payer: Medicare Other | Source: Ambulatory Visit | Attending: Radiation Oncology | Admitting: Radiation Oncology

## 2011-10-06 DIAGNOSIS — M25519 Pain in unspecified shoulder: Secondary | ICD-10-CM | POA: Diagnosis not present

## 2011-10-06 DIAGNOSIS — I10 Essential (primary) hypertension: Secondary | ICD-10-CM | POA: Diagnosis not present

## 2011-10-06 DIAGNOSIS — Z87891 Personal history of nicotine dependence: Secondary | ICD-10-CM | POA: Diagnosis not present

## 2011-10-06 DIAGNOSIS — Z510 Encounter for antineoplastic radiation therapy: Secondary | ICD-10-CM | POA: Diagnosis not present

## 2011-10-06 DIAGNOSIS — M542 Cervicalgia: Secondary | ICD-10-CM | POA: Diagnosis not present

## 2011-10-06 DIAGNOSIS — C50919 Malignant neoplasm of unspecified site of unspecified female breast: Secondary | ICD-10-CM | POA: Diagnosis not present

## 2011-10-06 DIAGNOSIS — C50419 Malignant neoplasm of upper-outer quadrant of unspecified female breast: Secondary | ICD-10-CM | POA: Diagnosis not present

## 2011-10-06 NOTE — Progress Notes (Signed)
  Radiation Oncology         [phone]) [phone] ________________________________  Name: Rachael Perez MRN: [id number]  Date: 10/06/2011  DOB: 1927-01-08  Simulation Verification Note  Status: outpatient  NARRATIVE: The patient was brought to the treatment unit and placed in the planned treatment position. The clinical setup was verified. Then port films were obtained and uploaded to the radiation oncology medical record software.  The treatment beams were carefully compared against the planned radiation fields. The position location and shape of the radiation fields was reviewed. They targeted volume of tissue appears to be appropriately covered by the radiation beams. Organs at risk appear to be excluded as planned.  Based on my personal review, I approved the simulation verification. The patient's treatment will proceed as planned.  -----------------------------------  Billie Lade, PhD, MD

## 2011-10-07 ENCOUNTER — Ambulatory Visit: Payer: Medicare Other

## 2011-10-07 ENCOUNTER — Ambulatory Visit
Admission: RE | Admit: 2011-10-07 | Discharge: 2011-10-07 | Disposition: A | Discharge status: Home / Self Care | Payer: Medicare Other | Source: Ambulatory Visit | Attending: Radiation Oncology | Admitting: Radiation Oncology

## 2011-10-07 DIAGNOSIS — Z510 Encounter for antineoplastic radiation therapy: Secondary | ICD-10-CM | POA: Diagnosis not present

## 2011-10-07 DIAGNOSIS — M542 Cervicalgia: Secondary | ICD-10-CM | POA: Diagnosis not present

## 2011-10-07 DIAGNOSIS — C50419 Malignant neoplasm of upper-outer quadrant of unspecified female breast: Secondary | ICD-10-CM | POA: Diagnosis not present

## 2011-10-07 DIAGNOSIS — Z87891 Personal history of nicotine dependence: Secondary | ICD-10-CM | POA: Diagnosis not present

## 2011-10-07 DIAGNOSIS — C50919 Malignant neoplasm of unspecified site of unspecified female breast: Secondary | ICD-10-CM | POA: Diagnosis not present

## 2011-10-07 DIAGNOSIS — I10 Essential (primary) hypertension: Secondary | ICD-10-CM | POA: Diagnosis not present

## 2011-10-07 DIAGNOSIS — M25519 Pain in unspecified shoulder: Secondary | ICD-10-CM | POA: Diagnosis not present

## 2011-10-07 MED ORDER — ALRA NON-METALLIC DEODORANT (RAD-ONC)
1.0000 "application " | Freq: Once | TOPICAL | Status: AC
  Administered 2011-10-07: 1 via TOPICAL

## 2011-10-07 MED ORDER — RADIAPLEXRX EX GEL
Freq: Once | CUTANEOUS | Status: AC
  Administered 2011-10-07: 18:00:00 via TOPICAL

## 2011-10-07 NOTE — Progress Notes (Signed)
Received patient and her friend in the clinic today for post sim education with Sam, Charity fundraiser. Patient is alert and oriented to person, place, and time. No distress noted. Steady gait noted. Pleasant affect noted. Patient denies pain at this time. Oriented patient to staff and routine of the clinic. Provided patient with RADIATION THERAPY AND YOU handbook then, reviewed pertinent information. Reviewed potential side effects and management. Provide patient with radiaplex and alra then directed upon use. All questions answered. Patient verbalized understanding of all things reviewed. Provided patient with this writer's business card and encouraged her to call with needs.

## 2011-10-08 ENCOUNTER — Ambulatory Visit: Payer: Medicare Other

## 2011-10-08 ENCOUNTER — Ambulatory Visit
Admission: RE | Admit: 2011-10-08 | Discharge: 2011-10-08 | Disposition: A | Discharge status: Home / Self Care | Payer: Medicare Other | Source: Ambulatory Visit | Attending: Radiation Oncology | Admitting: Radiation Oncology

## 2011-10-08 DIAGNOSIS — C50419 Malignant neoplasm of upper-outer quadrant of unspecified female breast: Secondary | ICD-10-CM | POA: Diagnosis not present

## 2011-10-08 DIAGNOSIS — M542 Cervicalgia: Secondary | ICD-10-CM | POA: Diagnosis not present

## 2011-10-08 DIAGNOSIS — M25519 Pain in unspecified shoulder: Secondary | ICD-10-CM | POA: Diagnosis not present

## 2011-10-08 DIAGNOSIS — Z87891 Personal history of nicotine dependence: Secondary | ICD-10-CM | POA: Diagnosis not present

## 2011-10-08 DIAGNOSIS — I10 Essential (primary) hypertension: Secondary | ICD-10-CM | POA: Diagnosis not present

## 2011-10-08 DIAGNOSIS — Z510 Encounter for antineoplastic radiation therapy: Secondary | ICD-10-CM | POA: Diagnosis not present

## 2011-10-09 ENCOUNTER — Ambulatory Visit
Admission: RE | Admit: 2011-10-09 | Discharge: 2011-10-09 | Disposition: A | Discharge status: Home / Self Care | Payer: Medicare Other | Source: Ambulatory Visit | Attending: Radiation Oncology | Admitting: Radiation Oncology

## 2011-10-09 DIAGNOSIS — C50419 Malignant neoplasm of upper-outer quadrant of unspecified female breast: Secondary | ICD-10-CM | POA: Diagnosis not present

## 2011-10-09 DIAGNOSIS — M25519 Pain in unspecified shoulder: Secondary | ICD-10-CM | POA: Diagnosis not present

## 2011-10-09 DIAGNOSIS — Z87891 Personal history of nicotine dependence: Secondary | ICD-10-CM | POA: Diagnosis not present

## 2011-10-09 DIAGNOSIS — M542 Cervicalgia: Secondary | ICD-10-CM | POA: Diagnosis not present

## 2011-10-09 DIAGNOSIS — I10 Essential (primary) hypertension: Secondary | ICD-10-CM | POA: Diagnosis not present

## 2011-10-09 DIAGNOSIS — Z510 Encounter for antineoplastic radiation therapy: Secondary | ICD-10-CM | POA: Diagnosis not present

## 2011-10-12 ENCOUNTER — Ambulatory Visit
Admission: RE | Admit: 2011-10-12 | Discharge: 2011-10-12 | Disposition: A | Discharge status: Home / Self Care | Payer: Medicare Other | Source: Ambulatory Visit | Attending: Radiation Oncology | Admitting: Radiation Oncology

## 2011-10-12 DIAGNOSIS — I10 Essential (primary) hypertension: Secondary | ICD-10-CM | POA: Diagnosis not present

## 2011-10-12 DIAGNOSIS — Z87891 Personal history of nicotine dependence: Secondary | ICD-10-CM | POA: Diagnosis not present

## 2011-10-12 DIAGNOSIS — C50419 Malignant neoplasm of upper-outer quadrant of unspecified female breast: Secondary | ICD-10-CM | POA: Diagnosis not present

## 2011-10-12 DIAGNOSIS — Z510 Encounter for antineoplastic radiation therapy: Secondary | ICD-10-CM | POA: Diagnosis not present

## 2011-10-12 DIAGNOSIS — M25519 Pain in unspecified shoulder: Secondary | ICD-10-CM | POA: Diagnosis not present

## 2011-10-12 DIAGNOSIS — M542 Cervicalgia: Secondary | ICD-10-CM | POA: Diagnosis not present

## 2011-10-13 ENCOUNTER — Ambulatory Visit
Admission: RE | Admit: 2011-10-13 | Discharge: 2011-10-13 | Disposition: A | Discharge status: Home / Self Care | Payer: Medicare Other | Source: Ambulatory Visit | Attending: Radiation Oncology | Admitting: Radiation Oncology

## 2011-10-13 ENCOUNTER — Encounter: Payer: Self-pay | Admitting: Radiation Oncology

## 2011-10-13 VITALS — BP 165/70 | HR 76 | Temp 97.6°F | Resp 20 | Wt 111.2 lb

## 2011-10-13 DIAGNOSIS — I10 Essential (primary) hypertension: Secondary | ICD-10-CM | POA: Diagnosis not present

## 2011-10-13 DIAGNOSIS — C50419 Malignant neoplasm of upper-outer quadrant of unspecified female breast: Secondary | ICD-10-CM

## 2011-10-13 DIAGNOSIS — Z87891 Personal history of nicotine dependence: Secondary | ICD-10-CM | POA: Diagnosis not present

## 2011-10-13 DIAGNOSIS — Z510 Encounter for antineoplastic radiation therapy: Secondary | ICD-10-CM | POA: Diagnosis not present

## 2011-10-13 DIAGNOSIS — M542 Cervicalgia: Secondary | ICD-10-CM | POA: Diagnosis not present

## 2011-10-13 DIAGNOSIS — M25519 Pain in unspecified shoulder: Secondary | ICD-10-CM | POA: Diagnosis not present

## 2011-10-13 NOTE — Progress Notes (Signed)
Patient arrived alert ,oriented x3, steady gait, completed 5/6 rad tx right breast,no skin changes noted, using radioplex gel bid, patient c/o of thoraic back pain slightly from lying on table,  3:48 PM

## 2011-10-13 NOTE — Progress Notes (Signed)
Weekly Management Note Current Dose: 13.35  Gy  Projected Dose: 42.72 Gy   Narrative:  The patient presents for routine under treatment assessment.  CBCT/MVCT images/Port film x-rays were reviewed.  The chart was checked. Doing well. No complaints except back pain after lying on the table. Would like to take some tylenol prior to treatment  Physical Findings: Weight: 111 lb 3.2 oz (50.44 kg). Unchanged. No skin redness.  Impression:  The patient is tolerating radiation.  Plan:  Continue treatment as planned. Continue radiaplex. OK to take tylenol prior to RT.

## 2011-10-14 ENCOUNTER — Ambulatory Visit
Admission: RE | Admit: 2011-10-14 | Discharge: 2011-10-14 | Disposition: A | Discharge status: Home / Self Care | Payer: Medicare Other | Source: Ambulatory Visit | Attending: Radiation Oncology | Admitting: Radiation Oncology

## 2011-10-14 DIAGNOSIS — C50919 Malignant neoplasm of unspecified site of unspecified female breast: Secondary | ICD-10-CM | POA: Diagnosis not present

## 2011-10-14 DIAGNOSIS — Z87891 Personal history of nicotine dependence: Secondary | ICD-10-CM | POA: Diagnosis not present

## 2011-10-14 DIAGNOSIS — M542 Cervicalgia: Secondary | ICD-10-CM | POA: Diagnosis not present

## 2011-10-14 DIAGNOSIS — I10 Essential (primary) hypertension: Secondary | ICD-10-CM | POA: Diagnosis not present

## 2011-10-14 DIAGNOSIS — Z510 Encounter for antineoplastic radiation therapy: Secondary | ICD-10-CM | POA: Diagnosis not present

## 2011-10-14 DIAGNOSIS — C50419 Malignant neoplasm of upper-outer quadrant of unspecified female breast: Secondary | ICD-10-CM | POA: Diagnosis not present

## 2011-10-14 DIAGNOSIS — M25519 Pain in unspecified shoulder: Secondary | ICD-10-CM | POA: Diagnosis not present

## 2011-10-15 ENCOUNTER — Ambulatory Visit
Admission: RE | Admit: 2011-10-15 | Discharge: 2011-10-15 | Disposition: A | Discharge status: Home / Self Care | Payer: Medicare Other | Source: Ambulatory Visit | Attending: Radiation Oncology | Admitting: Radiation Oncology

## 2011-10-15 DIAGNOSIS — C50419 Malignant neoplasm of upper-outer quadrant of unspecified female breast: Secondary | ICD-10-CM | POA: Diagnosis not present

## 2011-10-15 DIAGNOSIS — Z510 Encounter for antineoplastic radiation therapy: Secondary | ICD-10-CM | POA: Diagnosis not present

## 2011-10-15 DIAGNOSIS — Z87891 Personal history of nicotine dependence: Secondary | ICD-10-CM | POA: Diagnosis not present

## 2011-10-15 DIAGNOSIS — M25519 Pain in unspecified shoulder: Secondary | ICD-10-CM | POA: Diagnosis not present

## 2011-10-15 DIAGNOSIS — I10 Essential (primary) hypertension: Secondary | ICD-10-CM | POA: Diagnosis not present

## 2011-10-15 DIAGNOSIS — M542 Cervicalgia: Secondary | ICD-10-CM | POA: Diagnosis not present

## 2011-10-16 ENCOUNTER — Ambulatory Visit
Admission: RE | Admit: 2011-10-16 | Discharge: 2011-10-16 | Disposition: A | Discharge status: Home / Self Care | Payer: Medicare Other | Source: Ambulatory Visit | Attending: Radiation Oncology | Admitting: Radiation Oncology

## 2011-10-16 DIAGNOSIS — C50419 Malignant neoplasm of upper-outer quadrant of unspecified female breast: Secondary | ICD-10-CM | POA: Diagnosis not present

## 2011-10-16 DIAGNOSIS — I10 Essential (primary) hypertension: Secondary | ICD-10-CM | POA: Diagnosis not present

## 2011-10-16 DIAGNOSIS — Z510 Encounter for antineoplastic radiation therapy: Secondary | ICD-10-CM | POA: Diagnosis not present

## 2011-10-16 DIAGNOSIS — M25519 Pain in unspecified shoulder: Secondary | ICD-10-CM | POA: Diagnosis not present

## 2011-10-16 DIAGNOSIS — Z87891 Personal history of nicotine dependence: Secondary | ICD-10-CM | POA: Diagnosis not present

## 2011-10-16 DIAGNOSIS — M542 Cervicalgia: Secondary | ICD-10-CM | POA: Diagnosis not present

## 2011-10-19 ENCOUNTER — Ambulatory Visit
Admission: RE | Admit: 2011-10-19 | Discharge: 2011-10-19 | Disposition: A | Discharge status: Home / Self Care | Payer: Medicare Other | Source: Ambulatory Visit | Attending: Radiation Oncology | Admitting: Radiation Oncology

## 2011-10-19 DIAGNOSIS — M542 Cervicalgia: Secondary | ICD-10-CM | POA: Diagnosis not present

## 2011-10-19 DIAGNOSIS — I10 Essential (primary) hypertension: Secondary | ICD-10-CM | POA: Diagnosis not present

## 2011-10-19 DIAGNOSIS — Z87891 Personal history of nicotine dependence: Secondary | ICD-10-CM | POA: Diagnosis not present

## 2011-10-19 DIAGNOSIS — M25519 Pain in unspecified shoulder: Secondary | ICD-10-CM | POA: Diagnosis not present

## 2011-10-19 DIAGNOSIS — C50419 Malignant neoplasm of upper-outer quadrant of unspecified female breast: Secondary | ICD-10-CM | POA: Diagnosis not present

## 2011-10-19 DIAGNOSIS — Z510 Encounter for antineoplastic radiation therapy: Secondary | ICD-10-CM | POA: Diagnosis not present

## 2011-10-20 ENCOUNTER — Ambulatory Visit
Admission: RE | Admit: 2011-10-20 | Discharge: 2011-10-20 | Disposition: A | Discharge status: Home / Self Care | Payer: Medicare Other | Source: Ambulatory Visit | Attending: Radiation Oncology | Admitting: Radiation Oncology

## 2011-10-20 ENCOUNTER — Telehealth: Payer: Self-pay | Admitting: Radiation Oncology

## 2011-10-20 ENCOUNTER — Encounter: Payer: Self-pay | Admitting: Radiation Oncology

## 2011-10-20 VITALS — BP 167/86 | HR 78 | Resp 18 | Wt 111.2 lb

## 2011-10-20 DIAGNOSIS — M25519 Pain in unspecified shoulder: Secondary | ICD-10-CM | POA: Diagnosis not present

## 2011-10-20 DIAGNOSIS — C50419 Malignant neoplasm of upper-outer quadrant of unspecified female breast: Secondary | ICD-10-CM | POA: Diagnosis not present

## 2011-10-20 DIAGNOSIS — Z87891 Personal history of nicotine dependence: Secondary | ICD-10-CM | POA: Diagnosis not present

## 2011-10-20 DIAGNOSIS — I10 Essential (primary) hypertension: Secondary | ICD-10-CM | POA: Diagnosis not present

## 2011-10-20 DIAGNOSIS — Z510 Encounter for antineoplastic radiation therapy: Secondary | ICD-10-CM | POA: Diagnosis not present

## 2011-10-20 DIAGNOSIS — M542 Cervicalgia: Secondary | ICD-10-CM | POA: Diagnosis not present

## 2011-10-20 NOTE — Telephone Encounter (Signed)
Pt is very emotional about her financial burdens at this time. ACS referral has been faxed and [employer] application is being submitted for addl asst, as well.  10/23/2011: Cancercare paper faxed today

## 2011-10-20 NOTE — Progress Notes (Signed)
Weekly Management Note Current Dose: 26.7  Gy  Projected Dose: 42.72 Gy   Narrative:  The patient presents for routine under treatment assessment.  CBCT/MVCT images/Port film x-rays were reviewed.  The chart was checked. Doing well. Some pain in her right neck and shoulder. Would like to talk to financial counselor.  Physical Findings: Weight: 111 lb 3.2 oz (50.44 kg). Pink right breast  Impression:  The patient is tolerating radiation.  Plan:  Continue treatment as planned. Continue radiaplex. Ok to take anti-inflammatory for neck/shoulder pain. Will refer to financial counselor.

## 2011-10-20 NOTE — Progress Notes (Signed)
Patient presents to the clinic today unaccompanied for an under treat visit with Dr. Michell Heinrich. Patient is alert and oriented to person, place, and time. No distress noted. Steady gait noted. Pleasant affect noted. Patient denies pain at this time. Patient reports fatigue. Patient's skin looks amazing. To skin changes to right/treated breast noted.Patient continues to use Radiaplex as directed. Reported all findings to Dr. Michell Heinrich.

## 2011-10-21 ENCOUNTER — Ambulatory Visit
Admission: RE | Admit: 2011-10-21 | Discharge: 2011-10-21 | Disposition: A | Discharge status: Home / Self Care | Payer: Medicare Other | Source: Ambulatory Visit | Attending: Radiation Oncology | Admitting: Radiation Oncology

## 2011-10-21 DIAGNOSIS — Z510 Encounter for antineoplastic radiation therapy: Secondary | ICD-10-CM | POA: Diagnosis not present

## 2011-10-21 DIAGNOSIS — Z87891 Personal history of nicotine dependence: Secondary | ICD-10-CM | POA: Diagnosis not present

## 2011-10-21 DIAGNOSIS — I10 Essential (primary) hypertension: Secondary | ICD-10-CM | POA: Diagnosis not present

## 2011-10-21 DIAGNOSIS — C50919 Malignant neoplasm of unspecified site of unspecified female breast: Secondary | ICD-10-CM | POA: Diagnosis not present

## 2011-10-21 DIAGNOSIS — C50419 Malignant neoplasm of upper-outer quadrant of unspecified female breast: Secondary | ICD-10-CM | POA: Diagnosis not present

## 2011-10-21 DIAGNOSIS — M542 Cervicalgia: Secondary | ICD-10-CM | POA: Diagnosis not present

## 2011-10-21 DIAGNOSIS — M25519 Pain in unspecified shoulder: Secondary | ICD-10-CM | POA: Diagnosis not present

## 2011-10-23 ENCOUNTER — Ambulatory Visit
Admission: RE | Admit: 2011-10-23 | Discharge: 2011-10-23 | Disposition: A | Discharge status: Home / Self Care | Payer: Medicare Other | Source: Ambulatory Visit | Attending: Radiation Oncology | Admitting: Radiation Oncology

## 2011-10-23 DIAGNOSIS — M25519 Pain in unspecified shoulder: Secondary | ICD-10-CM | POA: Diagnosis not present

## 2011-10-23 DIAGNOSIS — Z87891 Personal history of nicotine dependence: Secondary | ICD-10-CM | POA: Diagnosis not present

## 2011-10-23 DIAGNOSIS — C50419 Malignant neoplasm of upper-outer quadrant of unspecified female breast: Secondary | ICD-10-CM | POA: Diagnosis not present

## 2011-10-23 DIAGNOSIS — Z510 Encounter for antineoplastic radiation therapy: Secondary | ICD-10-CM | POA: Diagnosis not present

## 2011-10-23 DIAGNOSIS — I10 Essential (primary) hypertension: Secondary | ICD-10-CM | POA: Diagnosis not present

## 2011-10-23 DIAGNOSIS — M542 Cervicalgia: Secondary | ICD-10-CM | POA: Diagnosis not present

## 2011-10-26 ENCOUNTER — Ambulatory Visit
Admission: RE | Admit: 2011-10-26 | Discharge: 2011-10-26 | Disposition: A | Discharge status: Home / Self Care | Payer: Medicare Other | Source: Ambulatory Visit | Attending: Radiation Oncology | Admitting: Radiation Oncology

## 2011-10-26 DIAGNOSIS — I10 Essential (primary) hypertension: Secondary | ICD-10-CM | POA: Diagnosis not present

## 2011-10-26 DIAGNOSIS — Z87891 Personal history of nicotine dependence: Secondary | ICD-10-CM | POA: Diagnosis not present

## 2011-10-26 DIAGNOSIS — M25519 Pain in unspecified shoulder: Secondary | ICD-10-CM | POA: Diagnosis not present

## 2011-10-26 DIAGNOSIS — C50419 Malignant neoplasm of upper-outer quadrant of unspecified female breast: Secondary | ICD-10-CM | POA: Diagnosis not present

## 2011-10-26 DIAGNOSIS — M542 Cervicalgia: Secondary | ICD-10-CM | POA: Diagnosis not present

## 2011-10-26 DIAGNOSIS — Z510 Encounter for antineoplastic radiation therapy: Secondary | ICD-10-CM | POA: Diagnosis not present

## 2011-10-27 ENCOUNTER — Encounter: Payer: Self-pay | Admitting: Radiation Oncology

## 2011-10-27 ENCOUNTER — Ambulatory Visit
Admission: RE | Admit: 2011-10-27 | Discharge: 2011-10-27 | Disposition: A | Discharge status: Home / Self Care | Payer: Medicare Other | Source: Ambulatory Visit | Attending: Radiation Oncology | Admitting: Radiation Oncology

## 2011-10-27 VITALS — BP 172/77 | HR 74 | Resp 18 | Wt 108.4 lb

## 2011-10-27 DIAGNOSIS — C50419 Malignant neoplasm of upper-outer quadrant of unspecified female breast: Secondary | ICD-10-CM | POA: Diagnosis not present

## 2011-10-27 DIAGNOSIS — M25519 Pain in unspecified shoulder: Secondary | ICD-10-CM | POA: Diagnosis not present

## 2011-10-27 DIAGNOSIS — M542 Cervicalgia: Secondary | ICD-10-CM | POA: Diagnosis not present

## 2011-10-27 DIAGNOSIS — I10 Essential (primary) hypertension: Secondary | ICD-10-CM | POA: Diagnosis not present

## 2011-10-27 DIAGNOSIS — Z510 Encounter for antineoplastic radiation therapy: Secondary | ICD-10-CM | POA: Diagnosis not present

## 2011-10-27 DIAGNOSIS — Z87891 Personal history of nicotine dependence: Secondary | ICD-10-CM | POA: Diagnosis not present

## 2011-10-27 MED ORDER — RADIAPLEXRX EX GEL
Freq: Once | CUTANEOUS | Status: AC
  Administered 2011-10-27: 12:00:00 via TOPICAL

## 2011-10-27 NOTE — Progress Notes (Signed)
Weekly Management Note Current Dose: 37.38  Gy  Projected Dose:42.72  Gy   Narrative:  The patient presents for routine under treatment assessment.  CBCT/MVCT images/Port film x-rays were reviewed.  The chart was checked. Doing well.  Having some chest pressure in the morning when she wakes up. No other time during the day.  Going to call PCP for appointment. Eating normally but lots of family stress which she thinks is contributing to weight loss.  Physical Findings: Weight: 108 lb 6.4 oz (49.17 kg). Dermatitis over inframammary fold and upper breast  Impression:  The patient is tolerating radiation.  Plan:  Continue treatment as planned. Continue radiaplex.

## 2011-10-27 NOTE — Progress Notes (Signed)
Patient presents to the clinic today for PUT with Dr. Michell Heinrich. Patient is alert and oriented to person, place, and time. No distress noted. Steady gait noted. Pleasant affect noted. Patient denies breast pain at this time. Patient denies nipple discharge. Faint hyperpigmentation of right chest wall and under the breast without desquamation noted. Patient reports using Radiaplex as directed. Provided patient with an addition tube of Radiaplex as requested. Approximately 3 pound weight loss noted since last week. Patient reports anxiety related to financial issues, concerns about treatment, and concerns about family. Reported all findings to Dr. Michell Heinrich.

## 2011-10-28 ENCOUNTER — Ambulatory Visit
Admission: RE | Admit: 2011-10-28 | Discharge: 2011-10-28 | Disposition: A | Discharge status: Home / Self Care | Payer: Medicare Other | Source: Ambulatory Visit | Attending: Radiation Oncology | Admitting: Radiation Oncology

## 2011-10-28 DIAGNOSIS — Z510 Encounter for antineoplastic radiation therapy: Secondary | ICD-10-CM | POA: Diagnosis not present

## 2011-10-28 DIAGNOSIS — M25519 Pain in unspecified shoulder: Secondary | ICD-10-CM | POA: Diagnosis not present

## 2011-10-28 DIAGNOSIS — C50419 Malignant neoplasm of upper-outer quadrant of unspecified female breast: Secondary | ICD-10-CM | POA: Diagnosis not present

## 2011-10-28 DIAGNOSIS — M542 Cervicalgia: Secondary | ICD-10-CM | POA: Diagnosis not present

## 2011-10-28 DIAGNOSIS — I10 Essential (primary) hypertension: Secondary | ICD-10-CM | POA: Diagnosis not present

## 2011-10-28 DIAGNOSIS — Z87891 Personal history of nicotine dependence: Secondary | ICD-10-CM | POA: Diagnosis not present

## 2011-10-29 ENCOUNTER — Ambulatory Visit: Payer: Medicare Other

## 2011-10-29 ENCOUNTER — Encounter: Payer: Self-pay | Admitting: Radiation Oncology

## 2011-10-29 ENCOUNTER — Ambulatory Visit
Admission: RE | Admit: 2011-10-29 | Discharge: 2011-10-29 | Disposition: A | Discharge status: Home / Self Care | Payer: Medicare Other | Source: Ambulatory Visit | Attending: Radiation Oncology | Admitting: Radiation Oncology

## 2011-10-29 DIAGNOSIS — M25519 Pain in unspecified shoulder: Secondary | ICD-10-CM | POA: Diagnosis not present

## 2011-10-29 DIAGNOSIS — M542 Cervicalgia: Secondary | ICD-10-CM | POA: Diagnosis not present

## 2011-10-29 DIAGNOSIS — Z510 Encounter for antineoplastic radiation therapy: Secondary | ICD-10-CM | POA: Diagnosis not present

## 2011-10-29 DIAGNOSIS — C50419 Malignant neoplasm of upper-outer quadrant of unspecified female breast: Secondary | ICD-10-CM | POA: Diagnosis not present

## 2011-10-29 DIAGNOSIS — I10 Essential (primary) hypertension: Secondary | ICD-10-CM | POA: Diagnosis not present

## 2011-10-29 DIAGNOSIS — Z87891 Personal history of nicotine dependence: Secondary | ICD-10-CM | POA: Diagnosis not present

## 2011-10-29 MED ORDER — RADIAPLEXRX EX GEL
Freq: Once | CUTANEOUS | Status: AC
  Administered 2011-10-29: 1 via TOPICAL

## 2011-10-30 ENCOUNTER — Ambulatory Visit: Payer: Medicare Other

## 2011-11-02 ENCOUNTER — Ambulatory Visit: Payer: Medicare Other

## 2011-11-03 ENCOUNTER — Ambulatory Visit: Payer: Medicare Other

## 2011-11-03 DIAGNOSIS — L719 Rosacea, unspecified: Secondary | ICD-10-CM | POA: Diagnosis not present

## 2011-11-03 DIAGNOSIS — L253 Unspecified contact dermatitis due to other chemical products: Secondary | ICD-10-CM | POA: Diagnosis not present

## 2011-11-03 NOTE — Progress Notes (Signed)
  Radiation Oncology         [phone] ________________________________  Name: Rachael Perez MRN: [id number]  Date: 10/29/2011  DOB: 03-May-1926  End of Treatment Note  Diagnosis:   The encounter diagnosis was Cancer of upper-outer quadrant of female breast.  Indication for treatment:  Curative     Radiation treatment dates:  10/07/2011-10/29/2011  Site/dose:   Right breast / 42.72 Gray at 2.67 Gy per fraction x 16 fractions  Beams/energy:   Opposed tangents / 6 MV photons  Narrative: The patient tolerated radiation treatment relatively well.   She had minimal skin toxicity or fatigue.    Plan: The patient has completed radiation treatment. The patient will return to radiation oncology clinic for routine followup in one month. I advised them to call or return sooner if they have any questions or concerns related to their recovery or treatment.  ------------------------------------------------  Lurline Hare, MD

## 2011-11-04 ENCOUNTER — Ambulatory Visit: Payer: Medicare Other

## 2011-11-05 ENCOUNTER — Ambulatory Visit: Payer: Medicare Other

## 2011-11-06 ENCOUNTER — Ambulatory Visit: Payer: Medicare Other

## 2011-11-09 ENCOUNTER — Ambulatory Visit: Payer: Medicare Other

## 2011-11-10 ENCOUNTER — Ambulatory Visit: Payer: Medicare Other

## 2011-11-11 ENCOUNTER — Ambulatory Visit: Payer: Medicare Other

## 2011-11-12 ENCOUNTER — Ambulatory Visit: Payer: Medicare Other

## 2011-11-13 ENCOUNTER — Other Ambulatory Visit: Payer: Self-pay | Admitting: *Deleted

## 2011-11-13 ENCOUNTER — Ambulatory Visit: Payer: Medicare Other

## 2011-11-16 ENCOUNTER — Ambulatory Visit: Payer: Medicare Other

## 2011-11-17 ENCOUNTER — Ambulatory Visit: Payer: Medicare Other

## 2011-11-18 ENCOUNTER — Telehealth: Payer: Self-pay | Admitting: Oncology

## 2011-11-18 ENCOUNTER — Telehealth: Payer: Self-pay | Admitting: *Deleted

## 2011-11-18 ENCOUNTER — Ambulatory Visit: Payer: Medicare Other

## 2011-11-18 NOTE — Telephone Encounter (Signed)
Per Dr. Truett Perna: pt should come in as scheduled. Called pt, she voiced understanding. Next appt confirmed.

## 2011-11-18 NOTE — Telephone Encounter (Signed)
called pt with 8/16 appt and  she was unsure as to why appt was needed,transferd her to susan

## 2011-11-18 NOTE — Telephone Encounter (Signed)
Call from pt asking if 8/16 appt with Dr. Truett Perna is necessary? Seeing Dr. Michell Heinrich the following week and follows up with Dr. Dwain Sarna the week after that. Feels like she has "a lot on her plate" right now. Will review with Dr. Truett Perna.

## 2011-11-19 ENCOUNTER — Ambulatory Visit: Payer: Medicare Other

## 2011-11-20 ENCOUNTER — Ambulatory Visit: Payer: Medicare Other

## 2011-11-23 ENCOUNTER — Ambulatory Visit: Payer: Medicare Other

## 2011-12-01 ENCOUNTER — Telehealth: Payer: Self-pay | Admitting: *Deleted

## 2011-12-01 ENCOUNTER — Other Ambulatory Visit: Payer: Self-pay | Admitting: *Deleted

## 2011-12-01 NOTE — Telephone Encounter (Signed)
Called to cancel her appointment with Dr. Truett Perna saying "I just have too much on my plate right now. I've been going to doctors all the time and I need a break". Expresses stress in family as well as some financial stress. Made her aware she needs to be seen to discuss further treatment, ? Tamoxifen. We are concerned she will delay too long and this could have negative impact on her prognosis. She verbalizes understanding, but says she just can't face another MD appointment right now. Will have scheduler call to reschedule for September. Will ask social worker to call her or meet her when she goes to see radiation oncology physician .

## 2011-12-03 ENCOUNTER — Telehealth: Payer: Self-pay | Admitting: Oncology

## 2011-12-03 NOTE — Telephone Encounter (Signed)
S/w the pt and she is aware of her sept appts °

## 2011-12-04 ENCOUNTER — Ambulatory Visit: Payer: Medicare Other | Admitting: Oncology

## 2011-12-10 ENCOUNTER — Encounter: Payer: Self-pay | Admitting: *Deleted

## 2011-12-10 ENCOUNTER — Encounter: Payer: Self-pay | Admitting: Radiation Oncology

## 2011-12-10 ENCOUNTER — Ambulatory Visit
Admission: RE | Admit: 2011-12-10 | Discharge: 2011-12-10 | Disposition: A | Discharge status: Home / Self Care | Payer: Medicare Other | Source: Ambulatory Visit | Attending: Radiation Oncology | Admitting: Radiation Oncology

## 2011-12-10 VITALS — Resp 18 | Wt 112.0 lb

## 2011-12-10 DIAGNOSIS — C50419 Malignant neoplasm of upper-outer quadrant of unspecified female breast: Secondary | ICD-10-CM

## 2011-12-10 NOTE — Progress Notes (Signed)
Clinical Social Worker met with pt in [facility] exam room to follow up/assess for additional needs and concerns.  Pt expressed concerns due to financial needs, and increased emotional stressors.  CSW assisted pt in processing her feelings and concerns.  CSW and pt discussed pt's financial needs and resources available.  Pt has previously worked with Delice Bison, Database administrator, and qualified for financial assistance.  CSW will consult with financial advocate regarding pt's funds and pt's request for assistance.  CSW encouraged pt to collect bills, and contact CSW or financial advocate to determine if they are eligible for assistance.    Tamala Julian, MSW, LCSW Clinical Social Worker [facility] [phone]

## 2011-12-10 NOTE — Progress Notes (Signed)
   Department of Radiation Oncology  Phone:  [phone] Fax:        [phone]   Name: Rachael Perez   DOB: 23-Mar-1927  MRN: [id number]    Date: 12/10/2011  Follow Up Visit Note  Diagnosis: T1 C. N0 invasive lobular carcinoma with a close inferior margin  Interval since last radiation: One month  Interval History: Rachael Perez presents today for routine followup.  She is healed up nicely. She is working with our social workers for some financial assistance. She feels like she had a reaction on her back and due to the radiation. That she says is healed up well. She is not on antiestrogen therapy  Allergies:  Allergies  Allergen Reactions  . Codeine Shortness Of Breath    Medications:  Current Outpatient Prescriptions  Medication Sig Dispense Refill  . aspirin 81 MG tablet Take 81 mg by mouth daily.      . Calcium Carbonate-Vitamin D (CALCIUM + D PO) Take 600 mg by mouth daily.       . DiphenhydrAMINE HCl, Sleep, (SLEEP-AID MAXIMUM STRENGTH PO) Take by mouth at bedtime as needed and may repeat dose one time if needed.        . hydrochlorothiazide (HYDRODIURIL) 25 MG tablet Take 25 mg by mouth daily.        . non-metallic deodorant Thornton Papas) MISC Apply 1 application topically daily as needed.      . Wound Cleansers (RADIAPLEX EX) Apply topically.        Physical Exam:   weight is 112 lb (50.803 kg). Her respiration is 18.  She really has an excellent cosmetic result her very minimal changes over the right breast. I don't see any rash or skin changes on her back.  IMPRESSION: Rachael Perez is a 78 y.o. female who is status post radiation for a lobular cancer with resolving acute effects of treatment  PLAN:  Rachael Perez looks great. She has a followup appointment Dr. Dwain Sarna. I will plan on seeing her back in 6 months with a mammogram. She does contact us with any questions or concerns. She met with her social workers following this visit.    Lurline Hare, MD

## 2011-12-10 NOTE — Progress Notes (Signed)
Patient presents to the clinic today unaccompanied for a follow up with Dr. Michell Heinrich. Patient is alert and oriented to person, place, and time. No distress noted. Steady gait noted. Pleasant affect noted. Patient denies breast pain at this time. Patient denies nipple discharge. Patient reports that her right breast has returned to it's pre treatment color/appearance. Patient reports that difficulty sleeping continues related to stress of financial concerns. Patient reports that she vomited several times this morning prior to this appointment. Patient denies headache or dizziness. Reported all findings to Dr. Michell Heinrich.

## 2011-12-18 ENCOUNTER — Ambulatory Visit (INDEPENDENT_AMBULATORY_CARE_PROVIDER_SITE_OTHER): Payer: Medicare Other | Admitting: General Surgery

## 2011-12-18 ENCOUNTER — Encounter (INDEPENDENT_AMBULATORY_CARE_PROVIDER_SITE_OTHER): Payer: Self-pay | Admitting: General Surgery

## 2011-12-18 VITALS — BP 122/82 | HR 85 | Temp 98.4°F | Ht 68.5 in | Wt 111.0 lb

## 2011-12-18 DIAGNOSIS — Z853 Personal history of malignant neoplasm of breast: Secondary | ICD-10-CM | POA: Diagnosis not present

## 2011-12-18 NOTE — Patient Instructions (Signed)

## 2011-12-18 NOTE — Progress Notes (Signed)
Subjective:     Patient ID: Rachael Perez, female   DOB: July 02, 1926, 78 y.o.   MRN: [id number]  HPI This is an 78 year old female who has a prior left breast cancer in the past. She was being seen by her medical oncologist in followup and felt a right breast mass. She ended up undergoing a right breast lumpectomy after discussion with the multidisciplinary conference. She has done well from this he returns today without any complaints. Her pathology showed a 1.7 cm grade 2 invasive lobular carcinoma.  She has invasive tumor 0.1 mm from the inferior margin. Additionally there is lobular carcinoma in situ present and this is focally present at the inferior, posterior, and medial margins. Her receptors previously showed a 10% positivity with estrogen and a 0% positivity with progesterone. The proliferation index was 12% and her HER-2/neu is not amplified. She has had a good summer and she returns today without complaints.  She has completed xrt but has not been back to discuss antiestrogen therapy which I think is still reasonable.   Review of Systems     Objective:   Physical Exam Incision well healed    Assessment:     S/p right lumpectomy/xrt    Plan:     I will see annually for exam.  She told me today she does have appt to see Dr. Truett Perna and we discussed indication for xrt as well as antiestrogen therapy given her pathology and prior cancer.

## 2011-12-24 DIAGNOSIS — M62838 Other muscle spasm: Secondary | ICD-10-CM | POA: Diagnosis not present

## 2011-12-24 DIAGNOSIS — H6120 Impacted cerumen, unspecified ear: Secondary | ICD-10-CM | POA: Diagnosis not present

## 2011-12-24 DIAGNOSIS — C50919 Malignant neoplasm of unspecified site of unspecified female breast: Secondary | ICD-10-CM | POA: Diagnosis not present

## 2012-01-04 ENCOUNTER — Other Ambulatory Visit: Payer: Medicare Other | Admitting: Lab

## 2012-01-04 ENCOUNTER — Ambulatory Visit (HOSPITAL_BASED_OUTPATIENT_CLINIC_OR_DEPARTMENT_OTHER): Payer: Medicare Other | Admitting: Oncology

## 2012-01-04 VITALS — BP 153/64 | HR 71 | Temp 97.0°F | Resp 18 | Ht 68.5 in | Wt 111.8 lb

## 2012-01-04 DIAGNOSIS — C50919 Malignant neoplasm of unspecified site of unspecified female breast: Secondary | ICD-10-CM

## 2012-01-04 DIAGNOSIS — C50419 Malignant neoplasm of upper-outer quadrant of unspecified female breast: Secondary | ICD-10-CM

## 2012-01-04 NOTE — Progress Notes (Signed)
   [facility]    OFFICE PROGRESS NOTE   INTERVAL HISTORY:   She completed right breast radiation on 10/29/2011. She reports developing skin erythema with the radiation. This has resolved.  The pathology from the right lumpectomy on Sep 01 2011 confirmed an invasive lobular carcinoma, grade 2, 1.7 cm, and i no lymphovascular invasion was noted. Invasive tumor was 0.1 mm from the inferior margin. There was associated lobular carcinoma in situ. In situ carcinoma was focally present at the inferior, posterior, and medial margins. The tumor returned positive for estrogen receptors at 10% and negative for progesterone receptors. There was no amplification of HER-2/neu.  She feels well at present. She returns for oncology evaluation after missing a previous appointment.  Objective:  Vital signs in last 24 hours:  Blood pressure 153/64, pulse 71, temperature 97 F (36.1 C), temperature source Oral, resp. rate 18, height 5' 8.5" (1.74 m), weight 111 lb 12.8 oz (50.712 kg).    HEENT: Neck without mass Lymphatics: No cervical, supraclavicular, or axillary nodes Resp: Lungs clear bilaterally Cardio: Regular rate and rhythm GI: No hepatomegaly Vascular: No leg edema  Breasts: Status post bilateral lumpectomy. No evidence for local tumor recurrence. No mass in either breast.   Medications: I have reviewed the patient's current medications.  Assessment/Plan: 1. Stage I left-sided breast cancer diagnosed in May 1996. Status post a left lumpectomy, radiation and 5 years of tamoxifen. She remains in clinical remission. 2. ? small lymph node inferior to the left axillary scar - not present on exam today 3. History of anxiety and anorexia/weight loss - her weight is stable today 4. T1 NX right-sided breast cancer, status post a right lumpectomy 09/01/2011,1.7 cm, grade 2, invasive lobular carcinoma with associated lobular carcinoma in situ, in situ carcinoma was present at the  inferior, posterior, and medial margins. She completed adjuvant radiation on 10/29/2011   Disposition:  Ms. Joubert was recently diagnosed with right-sided breast cancer. She was treated with a lumpectomy and adjuvant radiation. I discussed the potential benefit of adjuvant hormonal therapy with her today. We discussed the potential toxicities associated with aromatase inhibitor therapy. She declines hormonal therapy.  She will be scheduled for a bilateral mammogram in April of 2014. Ms. Gugliotta will return for an office visit in 6 months    Thornton Papas, MD  01/04/2012  1:20 PM

## 2012-01-04 NOTE — Patient Instructions (Signed)
[facility] Discharge Instructions  Your exam findings, labs and results were discussed with your MD today.   Please visit scheduling to obtain calendar for future appointments.  Please call the  Cancer Center at [phone] during business hours should you have any further questions or need assistance in obtaining follow-up care. If you have a medical emergency, please dial [phone].  Special Instructions:         

## 2012-01-11 ENCOUNTER — Telehealth: Payer: Self-pay | Admitting: Oncology

## 2012-01-11 NOTE — Telephone Encounter (Signed)
s.w. pt advise on march/april appts....mailed march/april schedule to pt...called [facility] and md appt....sed

## 2012-02-05 DIAGNOSIS — L719 Rosacea, unspecified: Secondary | ICD-10-CM | POA: Diagnosis not present

## 2012-02-05 DIAGNOSIS — L821 Other seborrheic keratosis: Secondary | ICD-10-CM | POA: Diagnosis not present

## 2012-02-05 DIAGNOSIS — D1801 Hemangioma of skin and subcutaneous tissue: Secondary | ICD-10-CM | POA: Diagnosis not present

## 2012-02-15 ENCOUNTER — Telehealth: Payer: Self-pay | Admitting: Radiation Oncology

## 2012-02-15 ENCOUNTER — Telehealth: Payer: Self-pay | Admitting: *Deleted

## 2012-02-15 NOTE — Telephone Encounter (Signed)
Returned patient's call. Per Dr. Nolon Rod last note on 01/04/2012 he explained the benefits of adjuvant hormonal therapy but, the patient declined. Patient explains that she "feel stupid now for not deciding to go ahead with a medication that will help prevent the cancer from coming back....but that she was very overwhelmed the day of the follow up with personal issues and not thinking clear." Normalized patient's feelings and offered support. Routed this message to Dr. Myrle Sheng and transferred patient to Dr. Nolon Rod nurse. Also, routed this message to Dr. Michell Heinrich. Encouraged patient to call with future needs and she verbalized understanding.

## 2012-02-15 NOTE — Telephone Encounter (Signed)
Patient asking if the Tamoxifen or aromatase inhibitor will really prevent her cancer from returning? If so, she is now willing to try it.

## 2012-02-16 ENCOUNTER — Telehealth: Payer: Self-pay | Admitting: *Deleted

## 2012-02-16 DIAGNOSIS — C50919 Malignant neoplasm of unspecified site of unspecified female breast: Secondary | ICD-10-CM

## 2012-02-16 MED ORDER — ANASTROZOLE 1 MG PO TABS: 1.0000 mg | ORAL_TABLET | Freq: Every day | ORAL | Status: DC

## 2012-02-16 NOTE — Telephone Encounter (Signed)
Per Dr. Truett Perna: Suggests she take arimidex 1 mg daily for prevention of breast cancer recurrence. Attempted to call patient. No answer or machine to leave message.

## 2012-02-17 ENCOUNTER — Telehealth: Payer: Self-pay | Admitting: *Deleted

## 2012-02-17 NOTE — Telephone Encounter (Signed)
Made patient aware that Dr. Truett Perna feels she should take the arimidex to prevent breast cancer recurrence. She continues to be very anxious about this and everything going on in her life and verbalizes there is no one available to help her. Says her sons are "too busy" with their lives to bother. She verbalizes anxiety about everyday matters that normally cause average person little stress. Told her the medication is at her pharmacy to pick up whenever she is ready to begin.

## 2012-04-18 DIAGNOSIS — H04129 Dry eye syndrome of unspecified lacrimal gland: Secondary | ICD-10-CM | POA: Diagnosis not present

## 2012-04-18 DIAGNOSIS — D3130 Benign neoplasm of unspecified choroid: Secondary | ICD-10-CM | POA: Diagnosis not present

## 2012-04-18 DIAGNOSIS — H3530 Unspecified macular degeneration: Secondary | ICD-10-CM | POA: Diagnosis not present

## 2012-04-18 DIAGNOSIS — H43819 Vitreous degeneration, unspecified eye: Secondary | ICD-10-CM | POA: Diagnosis not present

## 2012-05-05 DIAGNOSIS — H905 Unspecified sensorineural hearing loss: Secondary | ICD-10-CM | POA: Diagnosis not present

## 2012-05-05 DIAGNOSIS — E559 Vitamin D deficiency, unspecified: Secondary | ICD-10-CM | POA: Diagnosis not present

## 2012-05-05 DIAGNOSIS — I10 Essential (primary) hypertension: Secondary | ICD-10-CM | POA: Diagnosis not present

## 2012-05-05 DIAGNOSIS — F411 Generalized anxiety disorder: Secondary | ICD-10-CM | POA: Diagnosis not present

## 2012-05-05 DIAGNOSIS — G4700 Insomnia, unspecified: Secondary | ICD-10-CM | POA: Diagnosis not present

## 2012-05-05 DIAGNOSIS — C50919 Malignant neoplasm of unspecified site of unspecified female breast: Secondary | ICD-10-CM | POA: Diagnosis not present

## 2012-05-30 ENCOUNTER — Encounter: Payer: Self-pay | Admitting: Nurse Practitioner

## 2012-05-30 ENCOUNTER — Ambulatory Visit (INDEPENDENT_AMBULATORY_CARE_PROVIDER_SITE_OTHER): Payer: Medicare Other | Admitting: Nurse Practitioner

## 2012-05-30 VITALS — BP 130/58 | HR 68 | Ht 68.0 in | Wt 110.4 lb

## 2012-05-30 DIAGNOSIS — R634 Abnormal weight loss: Secondary | ICD-10-CM

## 2012-05-30 DIAGNOSIS — I10 Essential (primary) hypertension: Secondary | ICD-10-CM

## 2012-05-30 LAB — CBC WITH DIFFERENTIAL/PLATELET
Basophils Absolute: 0 10*3/uL (ref 0.0–0.1)
Basophils Relative: 0.4 % (ref 0.0–3.0)
Eosinophils Absolute: 0.1 10*3/uL (ref 0.0–0.7)
Eosinophils Relative: 1.2 % (ref 0.0–5.0)
HCT: 39.8 % (ref 36.0–46.0)
Hemoglobin: 13.2 g/dL (ref 12.0–15.0)
Lymphocytes Relative: 19.6 % (ref 12.0–46.0)
Lymphs Abs: 1.4 10*3/uL (ref 0.7–4.0)
MCHC: 33.2 g/dL (ref 30.0–36.0)
MCV: 93.5 fl (ref 78.0–100.0)
Monocytes Absolute: 0.6 10*3/uL (ref 0.1–1.0)
Monocytes Relative: 8.7 % (ref 3.0–12.0)
Neutro Abs: 5.2 10*3/uL (ref 1.4–7.7)
Neutrophils Relative %: 70.1 % (ref 43.0–77.0)
Platelets: 184 10*3/uL (ref 150.0–400.0)
RBC: 4.25 Mil/uL (ref 3.87–5.11)
RDW: 13.1 % (ref 11.5–14.6)
WBC: 7.4 10*3/uL (ref 4.5–10.5)

## 2012-05-30 LAB — BASIC METABOLIC PANEL
BUN: 27 mg/dL — ABNORMAL HIGH (ref 6–23)
CO2: 33 mEq/L — ABNORMAL HIGH (ref 19–32)
Calcium: 9.6 mg/dL (ref 8.4–10.5)
Chloride: 98 mEq/L (ref 96–112)
Creatinine, Ser: 0.7 mg/dL (ref 0.4–1.2)
GFR: 82.06 mL/min (ref >60.00)
Glucose, Bld: 107 mg/dL — ABNORMAL HIGH (ref 70–99)
Potassium: 4.2 mEq/L (ref 3.5–5.1)
Sodium: 137 mEq/L (ref 135–145)

## 2012-05-30 LAB — TSH: TSH: 0.56 u[IU]/mL (ref 0.35–5.50)

## 2012-05-30 NOTE — Progress Notes (Addendum)
Rachael Perez Date of Birth: 1927/02/06 Medical Record #[id number]  History of Present Illness: Rachael Perez is seen back today for a one year check. She is seen for Dr. Elease Hashimoto. She is a former patient of Dr. Ronnald Nian. She has HTN. No known CAD. Has had breast cancer left side in 1996 and then a recurrent right sided breast cancer back in 2013 - treated with lumpectomy and radiation. Also with a chronically abnromal chest CT. Followed by Dr. Truett Perna.   She was here a year ago. Was doing pretty well. Did develop the recurrent breast cancer since I saw her last.   She comes in today. She is here alone. She is doing ok. Remains pretty anxious which is her usual. Says she is thinking about moving to [place]. Has maybe more palpitations but says its because she is under more stress. No chest pain. Some shoulder pain that is worse with moving/turning. Still taking her HCTZ. She never picked up her Arimidex - thus she is not taking. She does not remember talking with Dr. Kalman Drape nurse and there are numerous phone calls documenting this issue. She is not clear on many subjects. Says she is "not sure from one day to the next". Her weight continues to drop. She is quite underweight.She does not know when she last saw Dr. Renato Gails.    Current Outpatient Prescriptions on File Prior to Visit  Medication Sig Dispense Refill  . aspirin 81 MG tablet Take 81 mg by mouth daily.      . Calcium Carbonate-Vitamin D (CALCIUM + D PO) Take 600 mg by mouth daily.       . hydrochlorothiazide (HYDRODIURIL) 25 MG tablet Take 25 mg by mouth daily.        Marland Kitchen MELATONIN PO Take 1 tablet by mouth Nightly. Does not know dosage       No current facility-administered medications on file prior to visit.    Allergies  Allergen Reactions  . Codeine Shortness Of Breath    Past Medical History  Diagnosis Date  . Hypertension   . Palpitations   . SOB (shortness of breath) on exertion   . History of medication  noncompliance   . Varicose veins   . Wears hearing aid   . Abnormal chest CT     Lesion is unchanged; Followed by Dr. Truett Perna  . Osteoporosis   . Anxiety   . Breast cancer 08/1994    Remote lumpectomy on L  . Breast cancer 05/2011    R breast with lumpectomy and XRT therapy  . Arthritis     Past Surgical History  Procedure Laterality Date  . Tonsillectomy    . Tubal ligation    . Breast lumpectomy  09/01/2011  . Cataract extraction, bilateral    . US echocardiography  12/17/2009    EF 55-60%  . Breast lumpectomy  1996    left breast    History  Smoking status  . Former Smoker  . Types: Cigarettes  . Quit date: 04/20/1952  Smokeless tobacco  . Never Used    History  Alcohol Use  . Yes    Comment: "I occasionally drink wine."    Family History  Problem Relation Age of Onset  . Heart disease Mother   . Heart disease Father   . Heart disease Sister   . Heart disease Brother   . Heart disease Sister   . Heart disease Sister   . Cancer Neg Hx     Review of  Systems: The review of systems is per the HPI.  All other systems were reviewed and are negative.  Physical Exam: BP 130/58  Pulse 68  Ht 5\' 8"  (1.727 m)  Wt 110 lb 6.4 oz (50.077 kg)  BMI 16.79 kg/m2 Patient is very pleasant and in no acute distress. She is markedly thin. Weight is down 5 more pounds. Skin is warm and dry. Color is normal.  HEENT is unremarkable. Normocephalic/atraumatic. PERRL. Sclera are nonicteric. Neck is supple. No masses. No JVD. Lungs are clear. Cardiac exam shows a regular rate and rhythm. She has an occasional ectopic. Abdomen is soft. Extremities are without edema. Gait and ROM are intact. No gross neurologic deficits noted.   LABORATORY DATA:  Lab Results  Component Value Date   WBC 8.0 08/28/2011   HGB 14.4 08/28/2011   HCT 42.1 08/28/2011   PLT 223 08/28/2011   GLUCOSE 65* 08/28/2011   CHOL 198 12/08/2010   TRIG 104.0 12/08/2010   HDL 67.80 12/08/2010   LDLCALC 109* 12/08/2010    ALT 15 12/08/2010   AST 27 12/08/2010   NA 141 08/28/2011   K 4.0 08/28/2011   CL 99 08/28/2011   CREATININE 0.79 08/28/2011   BUN 26* 08/28/2011   CO2 31 08/28/2011     Assessment / Plan: 1. HTN - blood pressure looks fine.   2. Recurrent breast cancer - 2013 - treated with right lumpectomy and XRT. She is not on Armidex and does not remember talking with oncology.   3. Anxiety - this is a chronic issue - she seems to have issues with dementia-like symptoms.   4. Weight loss - her nutrition status is quite important. She does not seem to understand the importance of this issue.   5. Palpitations - this is a chronic issue. Not dizzy or passing out. I have not added any more medicines.   We are going to check baseline labs today.   Patient is agreeable to this plan and will call if any problems develop in the interim.

## 2012-05-30 NOTE — Patient Instructions (Addendum)
We need to check labs today  Stay on your current medicines  I will see you back in a year  Call the [facility] office at [phone] if you have any questions, problems or concerns.

## 2012-06-06 ENCOUNTER — Ambulatory Visit: Payer: Medicare Other | Admitting: Oncology

## 2012-06-22 DIAGNOSIS — H1130 Conjunctival hemorrhage, unspecified eye: Secondary | ICD-10-CM | POA: Diagnosis not present

## 2012-06-23 ENCOUNTER — Telehealth: Payer: Self-pay | Admitting: Oncology

## 2012-06-23 NOTE — Telephone Encounter (Signed)
Called pt and left message r/s appt from 3/20 due to MD PAL, per MD instruction is 1 mos appt

## 2012-07-04 ENCOUNTER — Ambulatory Visit: Payer: Medicare Other | Admitting: Oncology

## 2012-07-07 ENCOUNTER — Ambulatory Visit: Payer: Medicare Other | Admitting: Oncology

## 2012-07-13 ENCOUNTER — Other Ambulatory Visit: Payer: Self-pay | Admitting: *Deleted

## 2012-07-13 DIAGNOSIS — I10 Essential (primary) hypertension: Secondary | ICD-10-CM

## 2012-07-13 DIAGNOSIS — G4700 Insomnia, unspecified: Secondary | ICD-10-CM

## 2012-07-13 DIAGNOSIS — H905 Unspecified sensorineural hearing loss: Secondary | ICD-10-CM

## 2012-07-13 DIAGNOSIS — E559 Vitamin D deficiency, unspecified: Secondary | ICD-10-CM

## 2012-07-15 ENCOUNTER — Telehealth: Payer: Self-pay | Admitting: Genetic Counselor

## 2012-07-29 ENCOUNTER — Telehealth: Payer: Self-pay | Admitting: Oncology

## 2012-07-29 ENCOUNTER — Ambulatory Visit (HOSPITAL_BASED_OUTPATIENT_CLINIC_OR_DEPARTMENT_OTHER): Payer: Medicare Other | Admitting: Oncology

## 2012-07-29 VITALS — BP 142/69 | HR 75 | Temp 97.3°F | Resp 18 | Ht 68.0 in | Wt 108.5 lb

## 2012-07-29 DIAGNOSIS — C50419 Malignant neoplasm of upper-outer quadrant of unspecified female breast: Secondary | ICD-10-CM

## 2012-07-29 DIAGNOSIS — F411 Generalized anxiety disorder: Secondary | ICD-10-CM | POA: Diagnosis not present

## 2012-07-29 DIAGNOSIS — Z853 Personal history of malignant neoplasm of breast: Secondary | ICD-10-CM

## 2012-07-29 NOTE — Progress Notes (Signed)
    Cancer Center    OFFICE PROGRESS NOTE   INTERVAL HISTORY:   She returns as scheduled. She reports anxiety surrounding the planned move from her home. No change over the breasts.  Objective:  Vital signs in last 24 hours:  Blood pressure 142/69, pulse 75, temperature 97.3 F (36.3 C), temperature source Oral, resp. rate 18, height 5\' 8"  (1.727 m), weight 108 lb 8 oz (49.215 kg).    HEENT: Neck without mass Lymphatics: No cervical, supraclavicular, or axillary nodes. I cannot clearly appreciate the previously noted nodule inferior to the left axillary scar. Resp: Lungs clear bilateral Cardio: Regular rate and rhythm GI: No hepatomegaly Vascular: No leg edema Breasts: Status post bilateral lumpectomy. No evidence for local tumor recurrence. No mass in either breast.   Medications: I have reviewed the patient's current medications.  Assessment/Plan: 1. Stage I left-sided breast cancer diagnosed in May 1996. Status post a left lumpectomy, radiation and 5 years of tamoxifen. She remains in clinical remission. 2. ? small lymph node inferior to the left axillary scar - not present on exam today 3. History of anxiety and anorexia/weight loss - her weight has not changed significantly over the past year. 4. T1 NX right-sided breast cancer, status post a right lumpectomy 09/01/2011,1.7 cm, grade 2, invasive lobular carcinoma with associated lobular carcinoma in situ, in situ carcinoma was present at the inferior, posterior, and medial margins. She completed adjuvant radiation on 10/29/2011   Disposition:  Ms. Fiorenza remains in clinical remission from breast cancer. She will be scheduled for a bilateral mammogram in June. She will return for an office visit in 6 months. She declined adjuvant hormonal therapy after being diagnosed with right-sided breast cancer in May of 2013.   Thornton Papas, MD  07/29/2012  10:37 AM

## 2012-07-29 NOTE — Telephone Encounter (Signed)
gv and printed pt appt schedule for April and OCT....scheduled pt for Mammo @ [facility] for April 23rd @ 1:15pm

## 2012-08-23 ENCOUNTER — Encounter: Payer: Self-pay | Admitting: Cardiology

## 2012-08-31 ENCOUNTER — Other Ambulatory Visit: Payer: Self-pay

## 2012-09-02 ENCOUNTER — Encounter: Payer: Self-pay | Admitting: Geriatric Medicine

## 2012-09-02 ENCOUNTER — Encounter: Payer: Self-pay | Admitting: Internal Medicine

## 2012-09-02 ENCOUNTER — Ambulatory Visit (INDEPENDENT_AMBULATORY_CARE_PROVIDER_SITE_OTHER): Payer: Medicare Other | Admitting: Internal Medicine

## 2012-09-02 VITALS — BP 150/68 | HR 78 | Temp 97.8°F | Resp 20 | Ht 68.0 in | Wt 109.0 lb

## 2012-09-02 DIAGNOSIS — G4700 Insomnia, unspecified: Secondary | ICD-10-CM

## 2012-09-02 DIAGNOSIS — M810 Age-related osteoporosis without current pathological fracture: Secondary | ICD-10-CM

## 2012-09-02 DIAGNOSIS — E559 Vitamin D deficiency, unspecified: Secondary | ICD-10-CM

## 2012-09-02 DIAGNOSIS — F0280 Dementia in other diseases classified elsewhere without behavioral disturbance: Secondary | ICD-10-CM

## 2012-09-02 DIAGNOSIS — IMO0001 Reserved for inherently not codable concepts without codable children: Secondary | ICD-10-CM

## 2012-09-02 DIAGNOSIS — R9389 Abnormal findings on diagnostic imaging of other specified body structures: Secondary | ICD-10-CM

## 2012-09-02 DIAGNOSIS — Z853 Personal history of malignant neoplasm of breast: Secondary | ICD-10-CM

## 2012-09-02 DIAGNOSIS — R030 Elevated blood-pressure reading, without diagnosis of hypertension: Secondary | ICD-10-CM

## 2012-09-02 NOTE — Progress Notes (Signed)
Patient ID: Rachael Perez, female   DOB: [birth year], 79 y.o.   MRN: [id number] Code Status: pt has living will, sons Onalee Hua (primary) and Molly Maduro (secondary) are her HCPOAs   Allergies  Allergen Reactions  . Codeine Shortness Of Breath  . Lodine (Etodolac)     Chief Complaint  Patient presents with  . Annual Exam    left great toe hurts and trouble sleeping    HPI: Patient is a 79 y.o. Caucasian female seen in the office today for her annual exam and has a couple of concerns about great toe pain and difficulty sleeping.  Is trying to decide if she should move with her son or stay in her house.  Radiation knocked her for a loop for her breast cancer and she is finally getting back to herself.  Refuses to take arimidex due to potential side effects.  Discussed arimidex again and she says she would rather take the risk of recurrence of her cancer than to take something that will give her awful side effects.  She says 79 yo is old enough to live and she does not want to live to 79 yo.  Spent some time with her one son (Dr. Mickie Kay) and family, and was offered to stay with them.  Wants to make sure her DIL is accepting of this.    Having toe pain.  Has a thick toenail on right great toe that is painful sometimes.  Had a pedicure so she is glad she has "happy feet".    Sleeping is still lousy--gets up at 400am, reads, drinks warm milk and takes a melatonin or two and sleeps till 830.  Discussed taking at 9pm instead.  Sometimes has a glass of wine with dinner, but not always.  If goes out, has a cosmopolitan.    Review of Systems:  Review of Systems  Constitutional: Positive for malaise/fatigue. Negative for fever, chills and weight loss.  HENT: Positive for hearing loss.        Wears hearing aide on right  Eyes: Negative for blurred vision.  Respiratory: Negative for cough and shortness of breath.   Cardiovascular: Negative for chest pain and leg swelling.       Varicose veins of feet  and legs  Gastrointestinal: Positive for constipation. Negative for heartburn and abdominal pain.       Had a single bout of severe constipation but no further episodes.  Genitourinary: Negative for dysuria, urgency and frequency.  Musculoskeletal: Negative for myalgias and falls.  Skin: Negative for rash.  Neurological: Negative for dizziness, focal weakness, loss of consciousness, weakness and headaches.  Endo/Heme/Allergies: Negative for environmental allergies.  Psychiatric/Behavioral: Positive for memory loss. Negative for depression. The patient is nervous/anxious and has insomnia.      Past Medical History  Diagnosis Date  . Hypertension   . Palpitations   . SOB (shortness of breath) on exertion   . History of medication noncompliance   . Varicose veins   . Wears hearing aid   . Abnormal chest CT     Lesion is unchanged; Followed by Dr. Truett Perna  . Senile osteoporosis   . Anxiety   . Breast cancer 08/1994    Remote lumpectomy on L  . Breast cancer 05/2011    R breast with lumpectomy and XRT therapy  . Arthritis   . Vitamin D deficiency   . Other B-complex deficiencies   . Dementia in conditions classified elsewhere without behavioral disturbance(294.10)   . Malignant neoplasm of  breast (female), unspecified site   . Chronic pain   . Varicose veins of lower extremities with inflammation   . Hallucinations   . Insomnia, unspecified    Past Surgical History  Procedure Laterality Date  . Tonsillectomy    . Tubal ligation    . Breast lumpectomy  09/01/2011  . Cataract extraction, bilateral    . US echocardiography  12/17/2009    EF 55-60%  . Breast lumpectomy  1996    left breast   Social History:   reports that she quit smoking about 60 years ago. Her smoking use included Cigarettes. She smoked 0.00 packs per day. She has never used smokeless tobacco. She reports that  drinks alcohol. She reports that she does not use illicit drugs.  Family History  Problem  Relation Age of Onset  . Heart disease Mother   . Heart disease Father   . Heart disease Sister   . Heart disease Brother   . Heart disease Sister   . Heart disease Sister   . Cancer Neg Hx     Medications: Patient's Medications  New Prescriptions   No medications on file  Previous Medications   ASPIRIN 81 MG TABLET    Take 81 mg by mouth daily.   CALCIUM CARBONATE-VITAMIN D (CALCIUM + D PO)    Take 600 mg by mouth daily.    HYDROCHLOROTHIAZIDE (HYDRODIURIL) 25 MG TABLET    Take 25 mg by mouth daily.     MELATONIN PO    Take 2 tablets by mouth Nightly. Does not know dosage  Modified Medications   No medications on file  Discontinued Medications   No medications on file   Physical Exam: Filed Vitals:   09/02/12 1046  BP: 150/68  Pulse: 78  Temp: 97.8 F (36.6 C)  TempSrc: Oral  Resp: 20  Height: 5\' 8"  (1.727 m)  Weight: 109 lb (49.442 kg)  SpO2: 97%  Physical Exam  Constitutional: She is oriented to person, place, and time.  Thin Caucasian female, NAD, well groomed as usual  HENT:  Head: Normocephalic and atraumatic.  Right Ear: Tympanic membrane, external ear and ear canal normal.  Left Ear: Tympanic membrane and ear canal normal.  Nose: Nose normal.  Mouth/Throat: No oropharyngeal exudate.  Eyes: Conjunctivae, EOM and lids are normal.  Neck: Full passive range of motion without pain. Neck supple. No JVD present. No tracheal deviation present. No mass and no thyromegaly present.  Cardiovascular: Normal rate, regular rhythm, S1 normal, S2 normal, normal heart sounds, intact distal pulses and normal pulses.   Pulmonary/Chest: Effort normal and breath sounds normal. No respiratory distress. Right breast exhibits mass. Right breast exhibits no nipple discharge, no skin change and no tenderness. Left breast exhibits tenderness. Left breast exhibits no nipple discharge and no skin change.  Tenderness over left breast scar from lumpectomy Right had residual tumor at margins  and had radiation therapy  Abdominal: Soft. Normal appearance and bowel sounds are normal.  Musculoskeletal: Normal range of motion. She exhibits no edema and no tenderness.  Neurological: She is alert and oriented to person, place, and time. She displays normal reflexes. No cranial nerve deficit. She exhibits normal muscle tone. Coordination normal.  Skin: Skin is warm and dry.  Psychiatric: Her behavior is normal. Judgment and thought content normal.  Very anxious   Labs reviewed: 05/27/2009 CBC: Hct 44.3 CMP: glucose 90, BUN 21, Creatinine 0.69 TSH 2.390 Vitamin D 18.3 Vitamin B-12 3.15 04/17/11 CBC; WBC 8.5, RBC  4.60, HGB 15.0 CMP; Glucose 87, BUN 24, Creatinine 0.79 05/07/2011 CBC Wbc 6.2 Rbc 4.40 Hemoglobin 14.3  CMP Glucose 82 BUN 25 Creatinine 0.69  Vitamin D 25 Hydroxy 37.6  Basic Metabolic Panel:  Recent Labs  16/10/96 1451  NA 137  K 4.2  CL 98  CO2 33*  GLUCOSE 107*  BUN 27*  CREATININE 0.7  CALCIUM 9.6  TSH 0.56  CBC:  Recent Labs  05/30/12 1451  WBC 7.4  NEUTROABS 5.2  HGB 13.2  HCT 39.8  MCV 93.5  PLT 184.0   Past Procedures: 09/11/2010  Total Score: 27/30; Failed clock Drawing--given by Buena Irish, CMA, failed intersecting pentagons.  05/07/11 MMSE 28/30; Failed clock drawing, given by Conception Chancy, CMA  Assessment/Plan Elevated blood pressure BP was at the upper limit of normal given her great functionality at this point.  I am concerned that lowering her bp will results in falls and fractures because of her osteoporosis.  I know she also drinks a glass of wine with dinner and sometimes a mixed drink if she goes out.  I will follow this at this point.    Abnormal chest CT Was unchanged in 1/13 and is followed by Dr. Myrle Sheng.    History of breast cancer in female She remains in remission after surgery and XRT.  Dr. Truett Perna has her down for b/l mammograms next month and f/u with him after that.  She has refused arimidex therapy due to  potential side effects.  She prefers to take the risk of recurrence considering her age and good functional status.    Senile osteoporosis She has refused prolia, bisphosphonate therapy due to risks with her ongoing need for dental procedures.  She takes calcium with Vit D with a recent normal range vitamin D level.  Her last bone density study was 09/23/10 consistent with osteoporosis.  I see no benefit to repeating this if she is not going to take the medications at this time.  Vitamin D deficiency Last level was 37 which is reasonable.  Continue calcium with Vitamin D and plenty in her diet plus weight bearing exercise.    Dementia in conditions classified elsewhere without behavioral disturbance(294.10) She has at least mild cognitive impairment.  We need to reevaluate her MMSE at her next appointment.  She has some evidence of memory loss when discussing her medications and keeping track of appointments.  She is considering selling her home and moving in with her son which I encouraged for safety and socialization purposes.  Insomnia, unspecified I have previously discussed with her that her evening glass of wine or mixed drink may be causing her early morning wakening, but she would like to continue this for pleasure which is reasonable at 79 yo.  I advised she take her melatonin 3 hrs prior to bedtime for better effect rather than right at the time she wants to be asleep (it works better that way).     Labs/tests ordered:  None--had cbc, bmp just done at cancer center

## 2012-09-02 NOTE — Patient Instructions (Signed)
Take your melatonin 1 pill at 9pm (3 hours before your scheduled bedtime).

## 2012-09-04 DIAGNOSIS — F0150 Vascular dementia without behavioral disturbance: Secondary | ICD-10-CM | POA: Insufficient documentation

## 2012-09-04 DIAGNOSIS — E559 Vitamin D deficiency, unspecified: Secondary | ICD-10-CM | POA: Insufficient documentation

## 2012-09-04 DIAGNOSIS — G4700 Insomnia, unspecified: Secondary | ICD-10-CM | POA: Insufficient documentation

## 2012-09-04 DIAGNOSIS — M810 Age-related osteoporosis without current pathological fracture: Secondary | ICD-10-CM | POA: Insufficient documentation

## 2012-09-04 NOTE — Assessment & Plan Note (Signed)
Was unchanged in 1/13 and is followed by Dr. Myrle Sheng.

## 2012-09-04 NOTE — Assessment & Plan Note (Signed)
She has refused prolia, bisphosphonate therapy due to risks with her ongoing need for dental procedures.  She takes calcium with Vit D with a recent normal range vitamin D level.  Her last bone density study was 09/23/10 consistent with osteoporosis.  I see no benefit to repeating this if she is not going to take the medications at this time.

## 2012-09-04 NOTE — Assessment & Plan Note (Signed)
BP was at the upper limit of normal given her great functionality at this point.  I am concerned that lowering her bp will results in falls and fractures because of her osteoporosis.  I know she also drinks a glass of wine with dinner and sometimes a mixed drink if she goes out.  I will follow this at this point.

## 2012-09-04 NOTE — Assessment & Plan Note (Signed)
She has at least mild cognitive impairment.  We need to reevaluate her MMSE at her next appointment.  She has some evidence of memory loss when discussing her medications and keeping track of appointments.  She is considering selling her home and moving in with her son which I encouraged for safety and socialization purposes.

## 2012-09-04 NOTE — Assessment & Plan Note (Signed)
Last level was 37 which is reasonable.  Continue calcium with Vitamin D and plenty in her diet plus weight bearing exercise.

## 2012-09-04 NOTE — Assessment & Plan Note (Signed)
I have previously discussed with her that her evening glass of wine or mixed drink may be causing her early morning wakening, but she would like to continue this for pleasure which is reasonable at 79 yo.  I advised she take her melatonin 3 hrs prior to bedtime for better effect rather than right at the time she wants to be asleep (it works better that way).

## 2012-09-04 NOTE — Assessment & Plan Note (Signed)
She remains in remission after surgery and XRT.  Dr. Truett Perna has her down for b/l mammograms next month and f/u with him after that.  She has refused arimidex therapy due to potential side effects.  She prefers to take the risk of recurrence considering her age and good functional status.

## 2012-09-27 DIAGNOSIS — Z853 Personal history of malignant neoplasm of breast: Secondary | ICD-10-CM | POA: Diagnosis not present

## 2012-10-10 ENCOUNTER — Encounter: Payer: Self-pay | Admitting: Internal Medicine

## 2012-10-12 ENCOUNTER — Encounter (INDEPENDENT_AMBULATORY_CARE_PROVIDER_SITE_OTHER): Payer: Self-pay | Admitting: General Surgery

## 2012-10-28 ENCOUNTER — Encounter: Payer: Self-pay | Admitting: *Deleted

## 2012-10-31 ENCOUNTER — Ambulatory Visit: Payer: Medicare Other | Admitting: Internal Medicine

## 2012-11-07 ENCOUNTER — Ambulatory Visit (INDEPENDENT_AMBULATORY_CARE_PROVIDER_SITE_OTHER): Payer: Medicare Other | Admitting: Internal Medicine

## 2012-11-07 ENCOUNTER — Encounter: Payer: Self-pay | Admitting: Internal Medicine

## 2012-11-07 VITALS — BP 130/68 | HR 83 | Temp 97.4°F | Resp 18 | Ht 68.0 in | Wt 108.6 lb

## 2012-11-07 DIAGNOSIS — Z853 Personal history of malignant neoplasm of breast: Secondary | ICD-10-CM | POA: Diagnosis not present

## 2012-11-07 DIAGNOSIS — I10 Essential (primary) hypertension: Secondary | ICD-10-CM

## 2012-11-07 DIAGNOSIS — F0280 Dementia in other diseases classified elsewhere without behavioral disturbance: Secondary | ICD-10-CM | POA: Diagnosis not present

## 2012-11-07 DIAGNOSIS — M810 Age-related osteoporosis without current pathological fracture: Secondary | ICD-10-CM

## 2012-11-07 NOTE — Progress Notes (Signed)
Patient ID: Rachael Perez, female   DOB: [birth year], 79 y.o.   MRN: [id number] Location:  [facility] / [place] [facility]  Code Status: has living will, sons are powers of attorney--Dr. Mickie Kay   Allergies  Allergen Reactions  . Codeine Shortness Of Breath  . Lodine (Etodolac)     Chief Complaint  Patient presents with  . Medical Managment of Chronic Issues    discuss moving from her private home, check memory    HPI: Patient is a 79 y.o. white female seen in the office today for discussion about memory and ability to manage on her own.  Her son, Dr. Mickie Kay, called me two weeks ago and plans to help her move to [place] where she will have more supervision.  I agreed with that idea.  She also has been having 2 drinks at times, and I've recommended only one max.  Had a mouse in the house--turned out to be two mice and she was really beside herself about it.  He notes she is having more difficulty taking care of things around the house.      She is trying to sign up at [place].  [place].  She is ok with this idea.  Agrees her memory is not always up to snuff.  Scored 29/30 today and failed her clock drawing.  Is drinking 2 glasses of wine.  Sometimes will make a drink with a chocolate flavor.  Says she is extremely cautious .  I recommended cutting back.  Says she feels ok about moving.  She will hate leaving her home and neighbors and trees.  Wants a place with trees and a pretty view.    Review of Systems:  Review of Systems  Constitutional: Negative for fever, chills, weight loss and malaise/fatigue.  HENT: Negative for congestion.   Eyes: Negative for blurred vision.  Respiratory: Negative for cough and shortness of breath.   Cardiovascular: Negative for chest pain and leg swelling.  Gastrointestinal: Negative for heartburn, abdominal pain and constipation.  Genitourinary: Negative for dysuria, urgency and frequency.   Musculoskeletal: Negative for myalgias, back pain, joint pain and falls.  Skin: Negative for rash.  Neurological: Negative for dizziness, loss of consciousness, weakness and headaches.  Endo/Heme/Allergies: Bruises/bleeds easily.  Psychiatric/Behavioral: Positive for memory loss. Negative for depression. The patient is nervous/anxious.     Past Medical History  Diagnosis Date  . Hypertension   . Palpitations   . SOB (shortness of breath) on exertion   . History of medication noncompliance   . Varicose veins   . Wears hearing aid   . Abnormal chest CT     Lesion is unchanged; Followed by Dr. Truett Perna  . Senile osteoporosis   . Anxiety   . Breast cancer 08/1994    Remote lumpectomy on L  . Breast cancer 05/2011    R breast with lumpectomy and XRT therapy  . Arthritis   . Vitamin D deficiency   . Other B-complex deficiencies   . Dementia in conditions classified elsewhere without behavioral disturbance(294.10)   . Malignant neoplasm of breast (female), unspecified site   . Chronic pain   . Varicose veins of lower extremities with inflammation   . Hallucinations   . Insomnia, unspecified     Past Surgical History  Procedure Laterality Date  . Tonsillectomy    . Tubal ligation    . Breast lumpectomy  09/01/2011  . Cataract extraction, bilateral    . US  echocardiography  12/17/2009    EF 55-60%  . Breast lumpectomy  1996    left breast    Social History:   reports that she quit smoking about 60 years ago. Her smoking use included Cigarettes. She smoked 0.00 packs per day. She has never used smokeless tobacco. She reports that  drinks alcohol. She reports that she does not use illicit drugs.  Family History  Problem Relation Age of Onset  . Heart disease Mother   . Heart disease Father   . Heart disease Sister   . Heart disease Brother   . Heart disease Sister   . Heart disease Sister   . Cancer Neg Hx     Medications: Patient's Medications  New Prescriptions    No medications on file  Previous Medications   ASPIRIN 81 MG TABLET    Take 81 mg by mouth daily.   CALCIUM CARBONATE-VITAMIN D (CALCIUM + D PO)    Take 600 mg by mouth daily.    HYDROCHLOROTHIAZIDE (HYDRODIURIL) 25 MG TABLET    Take 25 mg by mouth daily.     MELATONIN PO    Take 2 tablets by mouth Nightly. Does not know dosage  Modified Medications   No medications on file  Discontinued Medications   No medications on file   Physical Exam: Filed Vitals:   11/07/12 1309  BP: 130/68  Pulse: 83  Temp: 97.4 F (36.3 C)  TempSrc: Oral  Resp: 18  Height: 5\' 8"  (1.727 m)  Weight: 108 lb 9.6 oz (49.261 kg)  SpO2: 95%   Physical Exam  Constitutional: She is oriented to person, place, and time.  Thin, tall, white female ([place])  HENT:  Head: Normocephalic and atraumatic.  Eyes: EOM are normal. Pupils are equal, round, and reactive to light.  Neck: Normal range of motion.  Cardiovascular: Normal rate, regular rhythm, normal heart sounds and intact distal pulses.   Pulmonary/Chest: Effort normal and breath sounds normal.  Abdominal: Soft. Bowel sounds are normal. She exhibits no distension. There is no tenderness.  Musculoskeletal: Normal range of motion.  Neurological: She is alert and oriented to person, place, and time. No cranial nerve deficit.  Skin: Skin is warm and dry. There is pallor.  Psychiatric: She has a normal mood and affect.  Always anxious and fidgety, very pleasant lady    Labs reviewed: Basic Metabolic Panel:  Recent Labs  16/10/96 1451  NA 137  K 4.2  CL 98  CO2 33*  GLUCOSE 107*  BUN 27*  CREATININE 0.7  CALCIUM 9.6  TSH 0.56  CBC:  Recent Labs  05/30/12 1451  WBC 7.4  NEUTROABS 5.2  HGB 13.2  HCT 39.8  MCV 93.5  PLT 184.0   Assessment/Plan No problem-specific assessment & plan notes found for this encounter.    Labs/tests ordered:  None today Next appt:  3 mos

## 2012-11-09 DIAGNOSIS — I10 Essential (primary) hypertension: Secondary | ICD-10-CM | POA: Insufficient documentation

## 2012-11-09 NOTE — Assessment & Plan Note (Signed)
S/p right lumpectomy 1996, left lumpectomy and XRT 2013.  She is doing very well now.

## 2012-11-09 NOTE — Assessment & Plan Note (Signed)
Very mild, but she is having more trouble keeping track of her day to day routine-using a calendar which is incredibly busy.  I agree with her son that moving to [place] is a great idea.   I am concerned she will decline and it will be difficult to move her later.  We also talked about limiting her drinks to one, but she wasn't going for it.

## 2012-11-09 NOTE — Assessment & Plan Note (Signed)
BP at goal today with low dose diuretic.

## 2012-11-09 NOTE — Assessment & Plan Note (Signed)
On calcium with D.  Would like to add additional D3.  She has severe dental problems so my bisphosphonate recommendation has not been carried out previously.  Prolia has the same risk.

## 2012-12-01 ENCOUNTER — Ambulatory Visit: Payer: Medicare Other | Admitting: Internal Medicine

## 2012-12-05 ENCOUNTER — Ambulatory Visit (INDEPENDENT_AMBULATORY_CARE_PROVIDER_SITE_OTHER): Payer: Medicare Other | Admitting: Internal Medicine

## 2012-12-05 ENCOUNTER — Encounter: Payer: Self-pay | Admitting: Internal Medicine

## 2012-12-05 VITALS — BP 140/62 | HR 62 | Temp 97.9°F | Resp 14 | Ht 68.0 in | Wt 109.0 lb

## 2012-12-05 DIAGNOSIS — M810 Age-related osteoporosis without current pathological fracture: Secondary | ICD-10-CM | POA: Diagnosis not present

## 2012-12-05 DIAGNOSIS — F0280 Dementia in other diseases classified elsewhere without behavioral disturbance: Secondary | ICD-10-CM | POA: Diagnosis not present

## 2012-12-05 DIAGNOSIS — H905 Unspecified sensorineural hearing loss: Secondary | ICD-10-CM

## 2012-12-05 DIAGNOSIS — I10 Essential (primary) hypertension: Secondary | ICD-10-CM | POA: Diagnosis not present

## 2012-12-05 DIAGNOSIS — Z853 Personal history of malignant neoplasm of breast: Secondary | ICD-10-CM | POA: Diagnosis not present

## 2012-12-05 NOTE — Progress Notes (Signed)
Patient ID: TYNEKA SCAFIDI, female   DOB: January 21, 1927, 79 y.o.   MRN: [id number] Location:  [facility]  Code Status: DNR   Allergies  Allergen Reactions  . Codeine Shortness Of Breath  . Lodine [Etodolac]     Chief Complaint  Patient presents with  . Medical Managment of Chronic Issues    HPI: Patient is a 79 y.o. white female seen in the office today for f/u of chronic conditions including mild dementia, breast cancer s/p right and left lumpectomy with XRT, osteoporosis and vitamin D deficiency was seen for med mgt of chronic diseases.  She is doing well.  Last visit, we discussed that moving to [place] was a good idea so she had less to manage on her own.    She needs to sell her house to go to [place].  She is on the waitlist.  She continues to drink 1-2 glasses of wine at dinner.  I have recommended one or less due to her increasing frailty.  Review of Systems:  Review of Systems  Constitutional: Negative for fever, weight loss and malaise/fatigue.  HENT: Negative for congestion and neck pain.        Dry mouth, bad gums--to f/u with her dentist  Eyes: Negative for blurred vision.  Respiratory: Negative for shortness of breath.   Cardiovascular: Negative for chest pain, palpitations and leg swelling.  Gastrointestinal: Negative for heartburn, abdominal pain and constipation.  Genitourinary: Negative for dysuria.  Musculoskeletal: Positive for back pain and falls. Negative for myalgias and joint pain.  Skin: Negative for rash.  Neurological: Negative for dizziness, focal weakness, weakness and headaches.  Endo/Heme/Allergies: Does not bruise/bleed easily.  Psychiatric/Behavioral: Positive for memory loss. Negative for depression. The patient is nervous/anxious. The patient does not have insomnia.      Past Medical History  Diagnosis Date  . Hypertension   . Palpitations   . SOB (shortness of breath) on exertion   .  History of medication noncompliance   . Varicose veins   . Wears hearing aid   . Abnormal chest CT     Lesion is unchanged; Followed by Dr. Truett Perna  . Senile osteoporosis   . Anxiety   . Breast cancer 08/1994    Remote lumpectomy on L  . Breast cancer 05/2011    R breast with lumpectomy and XRT therapy  . Arthritis   . Vitamin D deficiency   . Other B-complex deficiencies   . Dementia in conditions classified elsewhere without behavioral disturbance(294.10)   . Malignant neoplasm of breast (female), unspecified site   . Chronic pain   . Varicose veins of lower extremities with inflammation   . Hallucinations   . Insomnia, unspecified     Past Surgical History  Procedure Laterality Date  . Tonsillectomy    . Tubal ligation    . Breast lumpectomy  09/01/2011  . Cataract extraction, bilateral    . US echocardiography  12/17/2009    EF 55-60%  . Breast lumpectomy  1996    left breast    Social History:   reports that she quit smoking about 60 years ago. Her smoking use included Cigarettes. She smoked 0.00 packs per day. She has never used smokeless tobacco. She reports that  drinks alcohol. She reports that she does not use illicit drugs.  Family History  Problem Relation Age of Onset  . Heart disease Mother   . Heart disease Father   . Heart disease Sister   .  Heart disease Brother   . Heart disease Sister   . Heart disease Sister   . Cancer Neg Hx     Medications: Patient's Medications  New Prescriptions   No medications on file  Previous Medications   ASPIRIN 81 MG TABLET    Take 81 mg by mouth daily.   CALCIUM CARBONATE-VITAMIN D (CALCIUM + D PO)    Take 600 mg by mouth daily.    HYDROCHLOROTHIAZIDE (HYDRODIURIL) 25 MG TABLET    Take 25 mg by mouth daily.     MELATONIN PO    Take 2 tablets by mouth Nightly. Does not know dosage  Modified Medications   No medications on file  Discontinued Medications   No medications on file     Physical Exam: Filed  Vitals:   12/05/12 1552  BP: 140/62  Pulse: 62  Temp: 97.9 F (36.6 C)  TempSrc: Oral  Resp: 14  Height: 5\' 8"  (1.727 m)  Weight: 109 lb (49.442 kg)  Physical Exam  Constitutional: She is oriented to person, place, and time. No distress.  Tall thin, increasingly frail white female  HENT:  Head: Normocephalic and atraumatic.  Eyes: EOM are normal. Pupils are equal, round, and reactive to light.  Cardiovascular: Normal rate, regular rhythm, normal heart sounds and intact distal pulses.   Pulmonary/Chest: Effort normal and breath sounds normal. No respiratory distress.  Abdominal: Soft. Bowel sounds are normal. She exhibits no distension. There is no tenderness.  Musculoskeletal: Normal range of motion. She exhibits no edema and no tenderness.  Of her back, gait slightly unsteady on standing, improves as she walks  Neurological: She is alert and oriented to person, place, and time. No cranial nerve deficit.  Skin: Skin is warm and dry.     Labs reviewed: Basic Metabolic Panel:  Recent Labs  98/11/91 1451  NA 137  K 4.2  CL 98  CO2 33*  GLUCOSE 107*  BUN 27*  CREATININE 0.7  CALCIUM 9.6  TSH 0.56  CBC:  Recent Labs  05/30/12 1451  WBC 7.4  NEUTROABS 5.2  HGB 13.2  HCT 39.8  MCV 93.5  PLT 184.0  Assessment/Plan 1. Dementia in conditions classified elsewhere without behavioral disturbance(294.10) -has plans to move to [place] independent living where she will have more supervision -her son, Dr. Mickie Kay, is helping her make these arrangements and she has been getting her house ready for sale for the past month -her dementia is mild, but she is getting overwhelmed with tasks at home and develops anxiety--avoid benzos in her due to fall risk and baseline cognitive impairment  2. History of breast cancer in female -follows with Dr. Myrle Sheng for this -prior lumpectomies and radiation therapy -weight has stabilized (had lost during treatment) -appetite remains  good  3. Essential hypertension, benign -at goal with hctz only  4. Senile osteoporosis -on calcium and vitamin D supplement -family has refused bisphosphonates for her due to her dentition and jaw problems and prolia carries the same risks -does do weightbearing exercise with walking, dancing, and is very active overall (retired model)  5. Sensorineural hearing loss, unilateral -recommended she see an audiologist and get hearing testing and hearing aides, but she has not followed up with this in the midst of preparing to move  Labs/tests ordered:  Will do labs at her next regular appt Next appt:  3 mos

## 2012-12-28 ENCOUNTER — Telehealth: Payer: Self-pay

## 2012-12-28 ENCOUNTER — Telehealth: Payer: Self-pay | Admitting: *Deleted

## 2012-12-28 NOTE — Telephone Encounter (Signed)
Left detailed message on voicemail for patient with Dr.Reed's response. Patient to return call is she has questions or concerns

## 2012-12-28 NOTE — Telephone Encounter (Signed)
Patient walked into the office:  Patient is currently taking Sleep Max PM OTC (Chamomile, Valerain Root, and Melatonin). Patient takes medication, wakes up a couple of hours later and has to take another pill. Patient is also having very vivid dreams while taking this OTC medication (Dreams that she is working in a fashion show and when she wakes up she is exhausted as if she was really working). Patient would like to know if Dr.Reed has any other OTC recommendations or something low dose with few side effects.   Dr.Reed please advise

## 2012-12-28 NOTE — Telephone Encounter (Signed)
She could take plain melatonin, but I believe she has tried that before.  She also should not drink alcohol when taking this.  That might be why she is having the dreams.  I have not other suggestions--sleeping pills are really not good for someone her age.

## 2012-12-28 NOTE — Telephone Encounter (Signed)
Pt. Came in to make an appointment with Norma Fredrickson, pt states a few days ago she experience left breat pain and  in the middle of her back and under her left arm pint; at the time pt was walking she had to sit down and the few minutes the pain went away, this has happened another time before. Pt is doing fine now. Pt has an appointment with Lawson Fiscal gerhardt on 01/16/13 at 10:30 Pt is aware to call the office if she has another episode od pain. Pt verbalized understanding.

## 2013-01-16 ENCOUNTER — Encounter: Payer: Self-pay | Admitting: Nurse Practitioner

## 2013-01-16 ENCOUNTER — Ambulatory Visit (INDEPENDENT_AMBULATORY_CARE_PROVIDER_SITE_OTHER): Payer: Medicare Other | Admitting: Nurse Practitioner

## 2013-01-16 VITALS — BP 140/72 | HR 78 | Ht 67.5 in | Wt 107.4 lb

## 2013-01-16 DIAGNOSIS — R0789 Other chest pain: Secondary | ICD-10-CM

## 2013-01-16 DIAGNOSIS — R9389 Abnormal findings on diagnostic imaging of other specified body structures: Secondary | ICD-10-CM | POA: Diagnosis not present

## 2013-01-16 MED ORDER — PROPRANOLOL HCL 10 MG PO TABS: 10.0000 mg | ORAL_TABLET | Freq: Three times a day (TID) | ORAL | Status: DC

## 2013-01-16 NOTE — Patient Instructions (Addendum)
I am adding some Inderal 10 mg to take "ONLY IF NEEDED" up to 3 times a day for your palpitations. This is at the drug store.  Stay on your other medicines  Cut back on your tea, your chocolate and your alcohol  Call the [facility] office at [phone] if you have any questions, problems or concerns.

## 2013-01-16 NOTE — Progress Notes (Signed)
Rachael Perez Date of Birth: 07/18/1926 Medical Record #[id number]  History of Present Illness: Rachael Perez is seen back today for a work in visit. She is seen for Dr. Elease Hashimoto. She is a former patient of Dr. Ronnald Nian. She has HTN. No known CAD. Has had breast cancer in 1996 with a recurrence in 2013. Has had an abnormal CT of the chest - she is followed by Dr. Truett Perna. Has a normal EF per echo back in 2011.   Last seen by me in February of 2014 - seemed to be doing ok - chronically anxious with flight of ideas - has a degree of dementia as well.  Comes back today. Here alone. Says her palpitations are getting worse - very anxious - says she "has so much on her plate". Not sleeping well. Feels her heart skip, flip and beat hard. No chest pain. Not short of breath. Probably drinking too much caffeine and does have daily alcohol use.   Current Outpatient Prescriptions  Medication Sig Dispense Refill  . aspirin 81 MG tablet Take 81 mg by mouth daily.      . Calcium Carbonate-Vitamin D (CALCIUM + D PO) Take 600 mg by mouth daily.       . hydrochlorothiazide (HYDRODIURIL) 25 MG tablet Take 25 mg by mouth daily.        Marland Kitchen MELATONIN PO Take 2 tablets by mouth Nightly. Does not know dosage      . propranolol (INDERAL) 10 MG tablet Take 1 tablet (10 mg total) by mouth 3 (three) times daily.  30 tablet  6   No current facility-administered medications for this visit.    Allergies  Allergen Reactions  . Codeine Shortness Of Breath  . Lodine [Etodolac]     Past Medical History  Diagnosis Date  . Hypertension   . Palpitations   . SOB (shortness of breath) on exertion   . History of medication noncompliance   . Varicose veins   . Wears hearing aid   . Abnormal chest CT     Lesion is unchanged; Followed by Dr. Truett Perna  . Senile osteoporosis   . Anxiety   . Breast cancer 08/1994    Remote lumpectomy on L  . Breast cancer 05/2011    R breast with lumpectomy and XRT therapy  .  Arthritis   . Vitamin D deficiency   . Other B-complex deficiencies   . Dementia in conditions classified elsewhere without behavioral disturbance(294.10)   . Malignant neoplasm of breast (female), unspecified site   . Chronic pain   . Varicose veins of lower extremities with inflammation   . Hallucinations   . Insomnia, unspecified     Past Surgical History  Procedure Laterality Date  . Tonsillectomy    . Tubal ligation    . Breast lumpectomy  09/01/2011  . Cataract extraction, bilateral    . US echocardiography  12/17/2009    EF 55-60%  . Breast lumpectomy  1996    left breast    History  Smoking status  . Former Smoker  . Types: Cigarettes  . Quit date: 04/20/1952  Smokeless tobacco  . Never Used    History  Alcohol Use  . Yes    Comment: "I occasionally drink wine."    Family History  Problem Relation Age of Onset  . Heart disease Mother   . Heart disease Father   . Heart disease Sister   . Heart disease Brother   . Heart disease Sister   .  Heart disease Sister   . Cancer Neg Hx     Review of Systems: The review of systems is per the HPI.  All other systems were reviewed and are negative.  Physical Exam: BP 140/72  Pulse 78  Ht 5' 7.5" (1.715 m)  Wt 107 lb 6.4 oz (48.716 kg)  BMI 16.56 kg/m2 Patient is very pleasant and in no acute distress. Very thin. Weight down 3 more pounds. Flight of ideas noted with conversation. Skin is warm and dry. Color is normal.  HEENT is unremarkable. Normocephalic/atraumatic. PERRL. Sclera are nonicteric. Neck is supple. No masses. No JVD. Lungs are clear. Cardiac exam shows a regular rate and rhythm. Abdomen is soft. Extremities are without edema. Gait and ROM are intact. No gross neurologic deficits noted.  LABORATORY DATA:  Lab Results  Component Value Date   WBC 7.4 05/30/2012   HGB 13.2 05/30/2012   HCT 39.8 05/30/2012   PLT 184.0 05/30/2012   GLUCOSE 107* 05/30/2012   CHOL 198 12/08/2010   TRIG 104.0 12/08/2010    HDL 67.80 12/08/2010   LDLCALC 109* 12/08/2010   ALT 15 12/08/2010   AST 27 12/08/2010   NA 137 05/30/2012   K 4.2 05/30/2012   CL 98 05/30/2012   CREATININE 0.7 05/30/2012   BUN 27* 05/30/2012   CO2 33* 05/30/2012   TSH 0.56 05/30/2012     Assessment / Plan: 1. Palpitations - she has had this as a chronic complaint - has never been one to take medicines consistently - will try some PRN Inderal - but I think she would do better with cutting back her caffeine use and her alcohol use.   2. HTN - BP ok.   Will try prn Inderal. See back as needed.   Patient is agreeable to this plan and will call if any problems develop in the interim.   Rachael Macadamia, RN, ANP-C [facility] [address] [place], [place]  [address]

## 2013-01-31 ENCOUNTER — Telehealth: Payer: Self-pay | Admitting: Oncology

## 2013-01-31 ENCOUNTER — Ambulatory Visit (HOSPITAL_BASED_OUTPATIENT_CLINIC_OR_DEPARTMENT_OTHER): Payer: Medicare Other | Admitting: Oncology

## 2013-01-31 VITALS — BP 129/60 | HR 69 | Temp 97.2°F | Resp 20 | Ht 67.5 in | Wt 106.2 lb

## 2013-01-31 DIAGNOSIS — F411 Generalized anxiety disorder: Secondary | ICD-10-CM

## 2013-01-31 DIAGNOSIS — C50419 Malignant neoplasm of upper-outer quadrant of unspecified female breast: Secondary | ICD-10-CM

## 2013-01-31 DIAGNOSIS — Z853 Personal history of malignant neoplasm of breast: Secondary | ICD-10-CM

## 2013-01-31 NOTE — Telephone Encounter (Signed)
gv and printed appt sched and avs for pt for April 2015  °

## 2013-01-31 NOTE — Progress Notes (Signed)
   [facility]    OFFICE PROGRESS NOTE   INTERVAL HISTORY:   She returns as scheduled. A bilateral mammogram on 09/27/2012 was negative. No change over either breast. She reports anxiety surrounding the potential move into a retirement facility. No other complaint.  Objective:  Vital signs in last 24 hours:  Blood pressure 129/60, pulse 69, temperature 97.2 F (36.2 C), temperature source Oral, resp. rate 20, height 5' 7.5" (1.715 m), weight 106 lb 3.2 oz (48.172 kg).    HEENT: Neck without mass Lymphatics: No cervical, supraclavicular, or axillary nodes Resp: Lungs clear bilaterally Cardio: Regular rate and rhythm GI: No hepatomegaly Vascular: No leg edema Breast: Status post bilateral lumpectomy. No evidence for local tumor recurrence. No mass in either breast. Pea-sized mobile nodule in the inferior to the left axillary scar. There is also a 2-3 mm mobile subcutaneous nodular area at the lateral left chest wall adjacent to a radiation tattoo.    Medications: I have reviewed the patient's current medications.  Assessment/Plan: 1. Stage I left-sided breast cancer diagnosed in May 1996. Status post a left lumpectomy, radiation and 5 years of tamoxifen. She remains in clinical remission. 2. ? small lymph node inferior to the left axillary scar - stable 3. History of anxiety and anorexia/weight loss  4. T1 NX right-sided breast cancer, status post a right lumpectomy 09/01/2011,1.7 cm, grade 2, invasive lobular carcinoma with associated lobular carcinoma in situ, in situ carcinoma was present at the inferior, posterior, and medial margins. She completed adjuvant radiation on 10/29/2011. She declined adjuvant hormonal therapy. 5. Pea-sized cutaneous nodular lesion adjacent to a radiation tattoo at the left lateral breast/chest wall   Disposition:  Ms. Potenza remains in clinical remission from breast cancer. She will return for an office visit in 6  months.   Thornton Papas, MD  01/31/2013  11:54 AM

## 2013-02-01 DIAGNOSIS — Z23 Encounter for immunization: Secondary | ICD-10-CM | POA: Diagnosis not present

## 2013-03-23 ENCOUNTER — Ambulatory Visit (INDEPENDENT_AMBULATORY_CARE_PROVIDER_SITE_OTHER): Payer: Medicare Other | Admitting: Internal Medicine

## 2013-03-23 ENCOUNTER — Encounter: Payer: Self-pay | Admitting: Internal Medicine

## 2013-03-23 VITALS — BP 132/80 | HR 67 | Temp 97.6°F | Resp 12 | Ht 67.5 in | Wt 107.0 lb

## 2013-03-23 DIAGNOSIS — Z853 Personal history of malignant neoplasm of breast: Secondary | ICD-10-CM

## 2013-03-23 DIAGNOSIS — F0280 Dementia in other diseases classified elsewhere without behavioral disturbance: Secondary | ICD-10-CM

## 2013-03-23 DIAGNOSIS — E559 Vitamin D deficiency, unspecified: Secondary | ICD-10-CM

## 2013-03-23 DIAGNOSIS — M810 Age-related osteoporosis without current pathological fracture: Secondary | ICD-10-CM

## 2013-03-23 DIAGNOSIS — I10 Essential (primary) hypertension: Secondary | ICD-10-CM

## 2013-03-23 NOTE — Progress Notes (Signed)
Patient ID: Rachael Perez, female   DOB: [birth year], 79 y.o.   MRN: [id number]   Location:  [facility]  Code Status: DNR   Allergies  Allergen Reactions  . Codeine Shortness Of Breath  . Lodine [Etodolac]     Chief Complaint  Patient presents with  . Assisted Living Consideration    Patient here today to have paperwork completed to consider becoming a resident at [facility]    . Immunizations    Discuss the need for pneumonia vaccine     HPI: Patient is a 79 y.o. [place] female seen in the office today for f/u of chronic conditions.   Doing fine except all of her old age stuff as she puts it.   Is going to go to [place].  Needs paperwork completed for this.  Review of Systems:  Review of Systems  Constitutional: Negative for fever, chills, weight loss and malaise/fatigue.  HENT: Positive for hearing loss.   Eyes: Negative for blurred vision.  Respiratory: Negative for shortness of breath.   Cardiovascular: Negative for chest pain.  Gastrointestinal: Negative for abdominal pain, constipation, blood in stool and melena.  Genitourinary: Negative for dysuria.  Musculoskeletal: Negative for falls and joint pain.  Skin: Negative for rash.  Neurological: Negative for dizziness, loss of consciousness, weakness and headaches.  Endo/Heme/Allergies: Does not bruise/bleed easily.  Psychiatric/Behavioral: Positive for memory loss. Negative for depression. The patient is nervous/anxious and has insomnia.      Past Medical History  Diagnosis Date  . Hypertension   . Palpitations   . SOB (shortness of breath) on exertion   . History of medication noncompliance   . Varicose veins   . Wears hearing aid   . Abnormal chest CT     Lesion is unchanged; Followed by Dr. Truett Perna  . Senile osteoporosis   . Anxiety   . Breast cancer 08/1994    Remote lumpectomy on L  . Breast cancer 05/2011    R breast with lumpectomy and  XRT therapy  . Arthritis   . Vitamin D deficiency   . Other B-complex deficiencies   . Dementia in conditions classified elsewhere without behavioral disturbance(294.10)   . Malignant neoplasm of breast (female), unspecified site   . Chronic pain   . Varicose veins of lower extremities with inflammation   . Hallucinations   . Insomnia, unspecified     Past Surgical History  Procedure Laterality Date  . Tonsillectomy    . Tubal ligation    . Breast lumpectomy  09/01/2011  . Cataract extraction, bilateral    . US echocardiography  12/17/2009    EF 55-60%  . Breast lumpectomy  1996    left breast    Social History:   reports that she quit smoking about 60 years ago. Her smoking use included Cigarettes. She smoked 0.00 packs per day. She has never used smokeless tobacco. She reports that she drinks alcohol. She reports that she does not use illicit drugs.  Family History  Problem Relation Age of Onset  . Heart disease Mother   . Heart disease Father   . Heart disease Sister   . Heart disease Brother   . Heart disease Sister   . Heart disease Sister   . Cancer Neg Hx     Medications: Patient's Medications  New Prescriptions   No medications on file  Previous Medications   AMBULATORY NON FORMULARY MEDICATION    Medication Name:  Sleep Max (Chamomile 5 mg , valerian root 125 mg and melatonin 1.5 mg). 1 by mouth at bedtime, patient will take another one if she awake in the middle of the night   ASPIRIN 81 MG TABLET    Take 81 mg by mouth daily.   CALCIUM CARBONATE-VITAMIN D (CALCIUM + D PO)    Take 600 mg by mouth daily.    HYDROCHLOROTHIAZIDE (HYDRODIURIL) 25 MG TABLET    Take 25 mg by mouth daily.    Modified Medications   No medications on file  Discontinued Medications   MELATONIN PO    Take 2 tablets by mouth Nightly.    PROPRANOLOL (INDERAL) 10 MG TABLET    Take 1 tablet (10 mg total) by mouth 3 (three) times daily.     Physical Exam: Filed Vitals:   03/23/13 1040    Pulse: 67  Temp: 97.6 F (36.4 C)  TempSrc: Oral  Resp: 12  Height: 5' 7.5" (1.715 m)  Weight: 107 lb (48.535 kg)  SpO2: 95%  Physical Exam  Constitutional: She is oriented to person, place, and time. No distress.  Tall thin [place] female  HENT:  Head: Normocephalic and atraumatic.  Neck: Neck supple.  Cardiovascular: Normal rate, regular rhythm, normal heart sounds and intact distal pulses.   Pulmonary/Chest: Effort normal and breath sounds normal. No respiratory distress.  Abdominal: Soft. Bowel sounds are normal. She exhibits no distension. There is no tenderness.  Musculoskeletal: Normal range of motion. She exhibits no edema and no tenderness.  Neurological: She is alert and oriented to person, place, and time.  Skin: Skin is warm and dry.  Psychiatric: She has a normal mood and affect.  anxious    Labs reviewed: Basic Metabolic Panel:  Recent Labs  78/29/56 1451  NA 137  K 4.2  CL 98  CO2 33*  GLUCOSE 107*  BUN 27*  CREATININE 0.7  CALCIUM 9.6  TSH 0.56  CBC:  Recent Labs  05/30/12 1451  WBC 7.4  NEUTROABS 5.2  HGB 13.2  HCT 39.8  MCV 93.5  PLT 184.0   Assessment/Plan 1. History of breast cancer in female -s/p lumpectomies on both sides and XRT -has some residual tenderness left, but no other new symptoms, weight stable, doing well  2. Dementia in conditions classified elsewhere without behavioral disturbance(294.10) -very mild---is affected dramatically by her anxiety -plans to move into [place] where she can remain independent but will not have so much to look after (currently lives alone in a large home)  3. Essential hypertension, benign -bp at goal with hctz, no orthostasis, doing well, lytes have been satisfactory  4. Senile osteoporosis -has refused bisphosphonates due to dental work needs, cont ca with vitamin D and weightbearing exercises  5. Vitamin D deficiency -cont ca with D  Labs/tests ordered:  Will do labs at next routine  visit Next appt:  3 mos

## 2013-03-24 ENCOUNTER — Ambulatory Visit: Payer: Self-pay | Admitting: Internal Medicine

## 2013-04-10 ENCOUNTER — Other Ambulatory Visit: Payer: Self-pay | Admitting: *Deleted

## 2013-04-10 MED ORDER — HYDROCHLOROTHIAZIDE 25 MG PO TABS: 25.0000 mg | ORAL_TABLET | Freq: Every day | ORAL | Status: DC

## 2013-05-01 DIAGNOSIS — D3130 Benign neoplasm of unspecified choroid: Secondary | ICD-10-CM | POA: Diagnosis not present

## 2013-05-01 DIAGNOSIS — Z961 Presence of intraocular lens: Secondary | ICD-10-CM | POA: Diagnosis not present

## 2013-06-20 ENCOUNTER — Encounter: Payer: Self-pay | Admitting: *Deleted

## 2013-06-22 ENCOUNTER — Ambulatory Visit: Payer: Medicare Other | Admitting: Internal Medicine

## 2013-07-06 ENCOUNTER — Ambulatory Visit (INDEPENDENT_AMBULATORY_CARE_PROVIDER_SITE_OTHER): Payer: Medicare Other | Admitting: Internal Medicine

## 2013-07-06 ENCOUNTER — Encounter: Payer: Self-pay | Admitting: Internal Medicine

## 2013-07-06 VITALS — BP 132/70 | HR 78 | Temp 91.8°F | Wt 101.6 lb

## 2013-07-06 DIAGNOSIS — K117 Disturbances of salivary secretion: Secondary | ICD-10-CM

## 2013-07-06 DIAGNOSIS — R634 Abnormal weight loss: Secondary | ICD-10-CM | POA: Diagnosis not present

## 2013-07-06 DIAGNOSIS — F419 Anxiety disorder, unspecified: Secondary | ICD-10-CM

## 2013-07-06 DIAGNOSIS — F489 Nonpsychotic mental disorder, unspecified: Secondary | ICD-10-CM

## 2013-07-06 DIAGNOSIS — R682 Dry mouth, unspecified: Secondary | ICD-10-CM

## 2013-07-06 DIAGNOSIS — F411 Generalized anxiety disorder: Secondary | ICD-10-CM

## 2013-07-06 DIAGNOSIS — I10 Essential (primary) hypertension: Secondary | ICD-10-CM

## 2013-07-06 DIAGNOSIS — F5105 Insomnia due to other mental disorder: Secondary | ICD-10-CM

## 2013-07-06 MED ORDER — NEBIVOLOL HCL 2.5 MG PO TABS: 2.5000 mg | ORAL_TABLET | Freq: Every day | ORAL | Status: DC

## 2013-07-06 MED ORDER — MIRTAZAPINE 7.5 MG PO TABS: 7.5000 mg | ORAL_TABLET | Freq: Every day | ORAL | Status: DC

## 2013-07-06 NOTE — Patient Instructions (Signed)
Stop hydrochlorothiazide water pill due to dry mouth Start bystolic 2.5mg  daily for blood pressure in the morning  Start remeron 7.5mg  at bedtime for difficulty sleeping and appetite.  This should calm your nerves.

## 2013-07-06 NOTE — Progress Notes (Signed)
Patient ID: Rachael Perez, female   DOB: [birth year], 80 y.o.   MRN: [id number]   Location:  [facility]  Allergies  Allergen Reactions  . Codeine Shortness Of Breath  . Lodine [Etodolac]     Chief Complaint  Patient presents with  . Follow-up    palpatations & weight lose ?, & refills  . other    ?  RT ear trouble.    HPI: Patient is a 80 y.o. white female seen in the office today for acute visit due to weight loss and palpitations.  Her son, [name], had contacted me with concerns about her having increased anxiety and paranoid delusions since moving to [place].  Is trying to sell her home and move it out.  Is trying to learn to say no to things.  Has lost weight.  She weighs herself at 92 lbs and here she is 101.6 down 7 lbs since August.  Is trying to eat.  Cannot eat a lot.  Does put butter on her bread, eats eggs.  Watching her food to try to gain weight.  Has difficulty with dry mouth which bothers her teeth.  Discussed getting rid of water pill with change to another med.    Varicose veins are bothering her.  Will not wear compression hose except on a plane.  Wears regular knee highs.    Is waking up at 4am.  Is lying there thinking she has to check on the house--did anyone take anything?  Has to get car fixed.  Has to take driver's test, etc.  Boys call to check on her in the evening--cannot go to sleep peacefully.    Remains active at [employer]--is doing a lot of musical programs, but they are overdoing it.  Says she has trouble saying no to things.    Review of Systems:  Review of Systems  Constitutional: Positive for weight loss. Negative for fever, chills and malaise/fatigue.  HENT: Positive for hearing loss. Negative for congestion.        Dry mouth  Eyes: Negative for blurred vision.  Respiratory: Negative for cough and shortness of breath.   Cardiovascular: Negative for chest pain and leg swelling.       Varicose  veins  Gastrointestinal: Negative for heartburn, constipation, blood in stool and melena.  Genitourinary: Negative for dysuria, urgency and frequency.  Musculoskeletal: Negative for falls and myalgias.  Skin: Negative for rash.  Neurological: Negative for dizziness, loss of consciousness, weakness and headaches.  Endo/Heme/Allergies: Bruises/bleeds easily.  Psychiatric/Behavioral: Positive for memory loss. Negative for depression and hallucinations. The patient is nervous/anxious and has insomnia.     Past Medical History  Diagnosis Date  . Hypertension   . Palpitations   . SOB (shortness of breath) on exertion   . History of medication noncompliance   . Varicose veins   . Wears hearing aid   . Abnormal chest CT     Lesion is unchanged; Followed by Dr. Benay Spice  . Senile osteoporosis   . Anxiety   . Breast cancer 08/1994    Remote lumpectomy on L  . Breast cancer 05/2011    R breast with lumpectomy and XRT therapy  . Arthritis   . Vitamin D deficiency   . Other B-complex deficiencies   . Dementia in conditions classified elsewhere without behavioral disturbance   . Malignant neoplasm of breast (female), unspecified site   . Chronic pain   . Varicose veins of lower extremities with  inflammation   . Hallucinations   . Insomnia, unspecified     Past Surgical History  Procedure Laterality Date  . Tonsillectomy    . Tubal ligation  1980  . Breast lumpectomy  09/01/2011  . Cataract extraction, bilateral    . US echocardiography  12/17/2009    EF 55-60%  . Breast lumpectomy  1996    left breast  . Varicose vein removal  1970    Social History:   reports that she quit smoking about 61 years ago. Her smoking use included Cigarettes. She smoked 0.00 packs per day. She has never used smokeless tobacco. She reports that she drinks alcohol. She reports that she does not use illicit drugs.  Family History  Problem Relation Age of Onset  . Heart disease Mother   . Heart disease  Father   . Heart disease Sister   . Heart disease Brother   . Arthritis Brother   . Heart disease Sister   . Alzheimer's disease Sister   . Cancer Neg Hx     Medications: Patient's Medications  New Prescriptions   No medications on file  Previous Medications   AMBULATORY NON FORMULARY MEDICATION    Medication Name: Sleep Max (Chamomile 5 mg , valerian root 125 mg and melatonin 1.5 mg). 1 by mouth at bedtime, patient will take another one if she awake in the middle of the night   ASPIRIN 81 MG TABLET    Take 81 mg by mouth daily.   CALCIUM CARBONATE-VITAMIN D (CALCIUM + D PO)    Take 600 mg by mouth daily.    HYDROCHLOROTHIAZIDE (HYDRODIURIL) 25 MG TABLET    Take 1 tablet (25 mg total) by mouth daily.  Modified Medications   No medications on file  Discontinued Medications   No medications on file     Physical Exam: Filed Vitals:   07/06/13 1024  BP: 132/70  Pulse: 78  Temp: 91.8 F (33.2 C)  TempSrc: Oral  Weight: 101 lb 9.6 oz (46.085 kg)  SpO2: 95%  Physical Exam  Constitutional:  Frail white female, nad  HENT:  Head: Normocephalic and atraumatic.  Wearing hearing aides  Neck: Normal range of motion. Neck supple.  Cardiovascular: Normal rate, regular rhythm, normal heart sounds and intact distal pulses.   Varicose veins of posterior lower legs  Pulmonary/Chest: Effort normal and breath sounds normal. No respiratory distress.  Abdominal: Soft. Bowel sounds are normal. She exhibits no distension and no mass. There is no tenderness.  Musculoskeletal: Normal range of motion. She exhibits no edema and no tenderness.  Neurological: She is alert.  Skin: Skin is warm and dry.  Psychiatric:  Very anxious and fidgety today   Assessment/Plan 1. Dry mouth -stop hctz which might be worsening this -if it does not improve, will recommend biotene mouthwash  2. Loss of weight -significant loss since last visit--seems she is not eating much due to her anxiety and adjustment  to [employer] -will start remeron for sleep, appetite, anxiety -is already drinking ensure and trying to increase protein and fat in her diet - mirtazapine (REMERON) 7.5 MG tablet; Take 1 tablet (7.5 mg total) by mouth at bedtime. For insomnia  Dispense: 30 tablet; Refill: 3  3. Insomnia secondary to anxiety -will try remeron for this due to triple benefit - mirtazapine (REMERON) 7.5 MG tablet; Take 1 tablet (7.5 mg total) by mouth at bedtime. For insomnia  Dispense: 30 tablet; Refill: 3 -if not successful, will try new agent for  sleep that has been tested in geriatric pts  4. Essential hypertension, benign - stop hctz due to dehydration -start nebivolol (BYSTOLIC) 2.5 MG tablet; Take 1 tablet (2.5 mg total) by mouth daily. For high blood pressure  Dispense: 30 tablet; Refill: 3  Next appt:  6 wks

## 2013-07-27 ENCOUNTER — Ambulatory Visit (INDEPENDENT_AMBULATORY_CARE_PROVIDER_SITE_OTHER): Payer: Medicare Other | Admitting: Internal Medicine

## 2013-07-27 ENCOUNTER — Encounter: Payer: Self-pay | Admitting: Internal Medicine

## 2013-07-27 VITALS — BP 122/70 | HR 84 | Temp 97.4°F | Resp 14 | Wt 103.0 lb

## 2013-07-27 DIAGNOSIS — F419 Anxiety disorder, unspecified: Secondary | ICD-10-CM | POA: Insufficient documentation

## 2013-07-27 DIAGNOSIS — F411 Generalized anxiety disorder: Secondary | ICD-10-CM

## 2013-07-27 DIAGNOSIS — F0280 Dementia in other diseases classified elsewhere without behavioral disturbance: Secondary | ICD-10-CM

## 2013-07-27 DIAGNOSIS — R682 Dry mouth, unspecified: Secondary | ICD-10-CM

## 2013-07-27 DIAGNOSIS — F5105 Insomnia due to other mental disorder: Secondary | ICD-10-CM | POA: Insufficient documentation

## 2013-07-27 DIAGNOSIS — R634 Abnormal weight loss: Secondary | ICD-10-CM | POA: Insufficient documentation

## 2013-07-27 DIAGNOSIS — F489 Nonpsychotic mental disorder, unspecified: Secondary | ICD-10-CM

## 2013-07-27 DIAGNOSIS — K117 Disturbances of salivary secretion: Secondary | ICD-10-CM | POA: Diagnosis not present

## 2013-07-27 NOTE — Progress Notes (Signed)
Patient ID: Rachael Perez, female   DOB: April 16, 1927, 80 y.o.   MRN: [id number]   Location:  [facility]   Allergies  Allergen Reactions  . Codeine Shortness Of Breath  . Lodine [Etodolac]     Chief Complaint  Patient presents with  . Medical Managment of Chronic Issues    3 month f/u with no recent labs  . other    not taking some medications other than Aspirin and sleep aid.    HPI: Patient is a 80 y.o. white female seen in the office today for f/u of chronic medical conditions.     Is very anxious.  Still has not take mirtazapine b/c she is afraid of side effects.  Discussed stopping the [facility] sleep aid which is benadryl--this may be worsening her dry mouth and confusion.  She does not recognize her confusion and gets frustrated that her son is reminding her of things.    Review of Systems:  Review of Systems  Constitutional: Negative for fever, chills, weight loss and malaise/fatigue.  HENT: Negative for congestion.        Dry mouth  Eyes: Negative for blurred vision.  Respiratory: Negative for shortness of breath.   Cardiovascular: Positive for leg swelling. Negative for chest pain.       Varicose veins with pain in feet right greater than left  Gastrointestinal: Negative for abdominal pain.  Genitourinary: Negative for dysuria, urgency and frequency.  Musculoskeletal: Negative for falls and myalgias.  Skin: Negative for rash.  Neurological: Negative for dizziness, loss of consciousness, weakness and headaches.  Endo/Heme/Allergies: Bruises/bleeds easily.  Psychiatric/Behavioral: Positive for depression and memory loss. The patient is nervous/anxious and has insomnia.      Past Medical History  Diagnosis Date  . Hypertension   . Palpitations   . SOB (shortness of breath) on exertion   . History of medication noncompliance   . Varicose veins   . Wears hearing aid   . Abnormal chest CT     Lesion is unchanged;  Followed by Dr. Benay Spice  . Senile osteoporosis   . Anxiety   . Breast cancer 08/1994    Remote lumpectomy on L  . Breast cancer 05/2011    R breast with lumpectomy and XRT therapy  . Arthritis   . Vitamin D deficiency   . Other B-complex deficiencies   . Dementia in conditions classified elsewhere without behavioral disturbance   . Malignant neoplasm of breast (female), unspecified site   . Chronic pain   . Varicose veins of lower extremities with inflammation   . Hallucinations   . Insomnia, unspecified     Past Surgical History  Procedure Laterality Date  . Tonsillectomy    . Tubal ligation  1980  . Breast lumpectomy  09/01/2011  . Cataract extraction, bilateral    . US echocardiography  12/17/2009    EF 55-60%  . Breast lumpectomy  1996    left breast  . Varicose vein removal  1970    Social History:   reports that she quit smoking about 61 years ago. Her smoking use included Cigarettes. She smoked 0.00 packs per day. She has never used smokeless tobacco. She reports that she drinks alcohol. She reports that she does not use illicit drugs.  Family History  Problem Relation Age of Onset  . Heart disease Mother   . Heart disease Father   . Heart disease Sister   . Heart disease Brother   .  Arthritis Brother   . Heart disease Sister   . Alzheimer's disease Sister   . Cancer Neg Hx     Medications: Patient's Medications  New Prescriptions   No medications on file  Previous Medications   AMBULATORY NON FORMULARY MEDICATION    Medication Name: Sleep Max (Chamomile 5 mg , valerian root 125 mg and melatonin 1.5 mg). 1 by mouth at bedtime, patient will take another one if she awake in the middle of the night   ASPIRIN 81 MG TABLET    Take 81 mg by mouth daily.   CALCIUM CARBONATE-VITAMIN D (CALCIUM + D PO)    Take 600 mg by mouth daily.    MIRTAZAPINE (REMERON) 7.5 MG TABLET    Take 1 tablet (7.5 mg total) by mouth at bedtime. For insomnia   NEBIVOLOL (BYSTOLIC) 2.5 MG  TABLET    Take 1 tablet (2.5 mg total) by mouth daily. For high blood pressure  Modified Medications   No medications on file  Discontinued Medications   No medications on file     Physical Exam: Filed Vitals:   07/27/13 1525  BP: 122/70  Pulse: 84  Temp: 97.4 F (36.3 C)  TempSrc: Oral  Resp: 14  Weight: 103 lb (46.72 kg)  SpO2: 96%  Physical Exam  Constitutional: She is oriented to person, place, and time. No distress.  Frail white female  Cardiovascular: Normal rate, regular rhythm, normal heart sounds and intact distal pulses.   Varicose veins severe in bilateral feet, very mild edema bilaterally in feet only  Pulmonary/Chest: Effort normal and breath sounds normal.  Abdominal: Soft. Bowel sounds are normal. She exhibits no distension and no mass. There is no tenderness.  Musculoskeletal: Normal range of motion. She exhibits no edema and no tenderness.  Neurological: She is alert and oriented to person, place, and time.  Confused today about which room she was in here, anxious and jittery  Skin: Skin is warm and dry.    Labs reviewed: None recently--ordered for today, but pt left w/o doing  Assessment/Plan 1. Dementia in conditions classified elsewhere without behavioral disturbance - has been taking generic benadryl to help with sleep and this seems to have adversely affected her cognition along with her anxiety -advised to stop this and take the mirtazapine for her mood, sleep and appetite as prescribed last time - CBC With differential/Platelet - Comprehensive metabolic panel  2. Insomnia secondary to anxiety - advised to use mirtazapine - avoid other sleep meds - Comprehensive metabolic panel  3. Dry mouth -stop benadryl - Comprehensive metabolic panel  4. Loss of weight - has gained a few lbs since last time - Comprehensive metabolic panel  Labs/tests ordered: Orders Placed This Encounter  Procedures  . CBC With differential/Platelet  .  Comprehensive metabolic panel   Next appt: 2 mos routine visit

## 2013-07-27 NOTE — Patient Instructions (Signed)
Please take the mirtazapine medicine--this will help your mood (keep you from crying), your appetite and your sleep.    Stop all of your other sleep aids.    We will monitor your blood pressure without hydrochlorothiazide and bystolic.

## 2013-08-04 ENCOUNTER — Telehealth: Payer: Self-pay | Admitting: *Deleted

## 2013-08-04 ENCOUNTER — Ambulatory Visit: Payer: Medicare Other | Admitting: Oncology

## 2013-08-04 NOTE — Telephone Encounter (Signed)
Left message on voicemail for pt to call office. (Would like pt to come in earlier today for 2PM appt.)

## 2013-08-16 ENCOUNTER — Encounter: Payer: Self-pay | Admitting: Internal Medicine

## 2013-09-28 ENCOUNTER — Ambulatory Visit (INDEPENDENT_AMBULATORY_CARE_PROVIDER_SITE_OTHER): Payer: Medicare Other | Admitting: Internal Medicine

## 2013-09-28 ENCOUNTER — Encounter: Payer: Self-pay | Admitting: Internal Medicine

## 2013-09-28 VITALS — BP 140/78 | HR 55 | Temp 97.5°F | Wt 99.8 lb

## 2013-09-28 DIAGNOSIS — K117 Disturbances of salivary secretion: Secondary | ICD-10-CM | POA: Diagnosis not present

## 2013-09-28 DIAGNOSIS — R682 Dry mouth, unspecified: Secondary | ICD-10-CM

## 2013-09-28 DIAGNOSIS — F411 Generalized anxiety disorder: Secondary | ICD-10-CM | POA: Diagnosis not present

## 2013-09-28 DIAGNOSIS — F5105 Insomnia due to other mental disorder: Secondary | ICD-10-CM

## 2013-09-28 DIAGNOSIS — F0280 Dementia in other diseases classified elsewhere without behavioral disturbance: Secondary | ICD-10-CM | POA: Diagnosis not present

## 2013-09-28 DIAGNOSIS — R634 Abnormal weight loss: Secondary | ICD-10-CM

## 2013-09-28 DIAGNOSIS — F489 Nonpsychotic mental disorder, unspecified: Secondary | ICD-10-CM

## 2013-09-28 DIAGNOSIS — Z853 Personal history of malignant neoplasm of breast: Secondary | ICD-10-CM

## 2013-09-28 DIAGNOSIS — F419 Anxiety disorder, unspecified: Secondary | ICD-10-CM

## 2013-09-28 DIAGNOSIS — M810 Age-related osteoporosis without current pathological fracture: Secondary | ICD-10-CM

## 2013-09-28 NOTE — Progress Notes (Signed)
Patient ID: Rachael Perez, female   DOB: 07-20-1926, 80 y.o.   MRN: [id number]   Location:  [facility]  Code Status: DNR; need to get advance directives out of [facility] [employer]   Allergies  Allergen Reactions  . Codeine Shortness Of Breath  . Lodine [Etodolac]     Chief Complaint  Patient presents with  . Medical Management of Chronic Issues    2 month follow-up     HPI: Patient is a 80 y.o. white female seen in the office today for medical mgt of chronic diseases.    Mouth feels dry.  Says her gums are flaky on the roof of her mouth and her teeth feel soft.    Is having cramps at night sometimes.    Gets dyspneic with great exertion--was able to keep up with me walking back from waiting room but was short of breath slightly when we got to the room.  Cannot easily raise her arms to shower.  Is going to get someone to come check on her at [employer] now.  Likes her apt by herself.      Has her breast cancer checkup and mammogram coming up so she worries about this.    Takes her nightly shot of bailey's irish cream.    Review of Systems:  Review of Systems  Constitutional: Positive for weight loss. Negative for fever, chills and malaise/fatigue.  HENT: Negative for congestion.        Dry mouth  Eyes: Negative for blurred vision.  Respiratory: Negative for cough and shortness of breath.   Cardiovascular: Negative for chest pain and leg swelling.       Varicose veins  Gastrointestinal: Negative for abdominal pain and constipation.  Genitourinary: Negative for dysuria, urgency and frequency.  Musculoskeletal: Negative for falls and myalgias.  Skin: Negative for rash.  Neurological: Negative for dizziness, loss of consciousness, weakness and headaches.  Endo/Heme/Allergies: Does not bruise/bleed easily.  Psychiatric/Behavioral: Positive for memory loss. Negative for depression. The patient is nervous/anxious and has insomnia.       Past Medical History  Diagnosis Date  . Hypertension   . Palpitations   . SOB (shortness of breath) on exertion   . History of medication noncompliance   . Varicose veins   . Wears hearing aid   . Abnormal chest CT     Lesion is unchanged; Followed by Dr. Benay Spice  . Senile osteoporosis   . Anxiety   . Breast cancer 08/1994    Remote lumpectomy on L  . Breast cancer 05/2011    R breast with lumpectomy and XRT therapy  . Arthritis   . Vitamin D deficiency   . Other B-complex deficiencies   . Dementia in conditions classified elsewhere without behavioral disturbance   . Malignant neoplasm of breast (female), unspecified site   . Chronic pain   . Varicose veins of lower extremities with inflammation   . Hallucinations   . Insomnia, unspecified     Past Surgical History  Procedure Laterality Date  . Tonsillectomy    . Tubal ligation  1980  . Breast lumpectomy  09/01/2011  . Cataract extraction, bilateral    . US echocardiography  12/17/2009    EF 55-60%  . Breast lumpectomy  1996    left breast  . Varicose vein removal  1970    Social History:   reports that she quit smoking about 61 years ago. Her smoking use included Cigarettes. She smoked  0.00 packs per day. She has never used smokeless tobacco. She reports that she drinks alcohol. She reports that she does not use illicit drugs.  Family History  Problem Relation Age of Onset  . Heart disease Mother   . Heart disease Father   . Heart disease Sister   . Heart disease Brother   . Arthritis Brother   . Heart disease Sister   . Alzheimer's disease Sister   . Cancer Neg Hx     Medications: Patient's Medications  New Prescriptions   No medications on file  Previous Medications   AMBULATORY NON FORMULARY MEDICATION    Medication Name: Sleep Max (Chamomile 5 mg , valerian root 125 mg and melatonin 1.5 mg). 1 by mouth at bedtime, patient will take another one if she awake in the middle of the night   ASPIRIN 81  MG TABLET    Take 81 mg by mouth daily.   CALCIUM CARBONATE-VITAMIN D (CALCIUM + D PO)    Take 600 mg by mouth daily.    MELATONIN PO    Take by mouth at bedtime.  Modified Medications   No medications on file  Discontinued Medications   MIRTAZAPINE (REMERON) 7.5 MG TABLET    Take 1 tablet (7.5 mg total) by mouth at bedtime. For insomnia     Physical Exam: Filed Vitals:   09/28/13 1327  BP: 140/78  Pulse: 55  Temp: 97.5 F (36.4 C)  TempSrc: Oral  Weight: 99 lb 12.8 oz (45.269 kg)  SpO2: 97%  Physical Exam  Constitutional: She is oriented to person, place, and time.  Tall, thin, increasingly frail white female  Cardiovascular: Normal rate, regular rhythm, normal heart sounds and intact distal pulses.   Varicose veins of feet and ankles  Pulmonary/Chest: Effort normal and breath sounds normal.  Small palpable nodule in outer lower quadrant of left breast tissue, tender  Abdominal: Soft. Bowel sounds are normal. She exhibits no distension and no mass. There is no tenderness.  Musculoskeletal: Normal range of motion. She exhibits no edema and no tenderness.  Neurological: She is alert and oriented to person, place, and time.  Skin: Skin is warm and dry.  Psychiatric: She has a normal mood and affect.  Anxious, talks quickly    Assessment/Plan 1. Dementia in conditions classified elsewhere without behavioral disturbance -had appt time wrong today -was taking a sleeping pill otc again--advised against 2. Insomnia secondary to anxiety -still waking up in middle of the night and eating cereal with milk and going back to sleep 3. Dry mouth -advised biotene use 4. Loss of weight -lost 4 more lbs -stopped remeron b/c she thought it would make her fall, but takes other meds I do not recommend that do increase her fall risk -has found that she can eat the fish  -talked about eating more 5. History of breast cancer in female -has small palpable nodule in left lower outer  quadrant -has her f/u mammogram and oncology appt scheduled 6. Senile osteoporosis -cont vitamin D and recheck level  Labs/tests ordered: Orders Placed This Encounter  Procedures  . CBC With differential/Platelet  . Comprehensive metabolic panel  . TSH  . Vitamin D, 25-hydroxy   Next appt:  3 mos

## 2013-10-02 ENCOUNTER — Ambulatory Visit (HOSPITAL_BASED_OUTPATIENT_CLINIC_OR_DEPARTMENT_OTHER): Payer: Medicare Other | Admitting: Oncology

## 2013-10-02 VITALS — BP 152/65 | HR 84 | Temp 98.3°F | Resp 18 | Ht 67.5 in | Wt 100.2 lb

## 2013-10-02 DIAGNOSIS — Z853 Personal history of malignant neoplasm of breast: Secondary | ICD-10-CM

## 2013-10-02 DIAGNOSIS — M810 Age-related osteoporosis without current pathological fracture: Secondary | ICD-10-CM | POA: Diagnosis not present

## 2013-10-02 NOTE — Progress Notes (Signed)
  [facility] OFFICE PROGRESS NOTE   Diagnosis: Breast cancer  INTERVAL HISTORY:   [name] returns as scheduled. She reports continued anxiety. She is working on her food intake. She has relocated to an assisted living facility and is happy with this. She reports the food is good and she enjoys the activities. She is not taking medications at present. She reports exertional dyspnea.  Objective:  Vital signs in last 24 hours:  Blood pressure 152/65, pulse 84, temperature 98.3 F (36.8 C), temperature source Oral, resp. rate 18, height 5' 7.5" (1.715 m), weight 100 lb 3.2 oz (45.45 kg), SpO2 98.00%.    HEENT: Neck without mass Lymphatics: No cervical or supraclavicular nodes. "Shotty "right axillary nodes and a pea-sized node in the left axilla near the axillary scar Resp: Clear bilaterally Cardio: Regular rate and rhythm GI: No hepatomegaly Vascular: No leg edema Breasts: Status post bilateral lumpectomy. No mass in either breast. Pea-sized mobile nodule lateral to the left lumpectomy scar.  Medications: I have reviewed the patient's current medications.  Assessment/Plan: 1. Stage I left-sided breast cancer diagnosed in May 1996. Status post a left lumpectomy, radiation and 5 years of tamoxifen. She remains in clinical remission. 2. ? small lymph node inferior to the left axillary scar - stable 3. History of anxiety and anorexia/weight loss  4. T1 NX right-sided breast cancer, status post a right lumpectomy 09/01/2011,1.7 cm, grade 2, invasive lobular carcinoma with associated lobular carcinoma in situ, in situ carcinoma was present at the inferior, posterior, and medial margins. She completed adjuvant radiation on 10/29/2011. She declined adjuvant hormonal therapy.  Disposition:  Rachael Perez remains in clinical remission from breast cancer. She is due for a mammogram this month. I encouraged her to resume calcium and vitamin D for treatment of osteoporosis. She  continues internal medicine care with Dr. Mariea Clonts. Rachael Perez will return for an office visit here in 6 months.  Betsy Coder, MD  10/02/2013  11:04 AM

## 2013-10-03 ENCOUNTER — Telehealth: Payer: Self-pay | Admitting: *Deleted

## 2013-10-03 NOTE — Telephone Encounter (Signed)
Message copied by Eilene Ghazi on Tue Oct 03, 2013 11:46 AM ------      Message from: Gayland Curry      Created: Tue Oct 03, 2013  8:04 AM       Please ask pt to come in for her labs--she was to get them before leaving her visit and I just realized she didn't do them--they are shown in labs.            ----- Message -----         From: SYSTEM         Sent: 10/03/2013  12:00 AM           To: Gayland Curry, DO                   ------

## 2013-10-03 NOTE — Telephone Encounter (Signed)
Spoke with patient regarding lab-work, and she will come

## 2013-10-04 ENCOUNTER — Telehealth: Payer: Self-pay | Admitting: Oncology

## 2013-10-04 NOTE — Telephone Encounter (Signed)
lvm for pt regarding to Dec appt...amield pt appt sched/avs adn letter

## 2013-12-28 ENCOUNTER — Encounter: Payer: Self-pay | Admitting: Internal Medicine

## 2013-12-28 ENCOUNTER — Ambulatory Visit (INDEPENDENT_AMBULATORY_CARE_PROVIDER_SITE_OTHER): Payer: Self-pay | Admitting: Internal Medicine

## 2013-12-28 VITALS — BP 148/70 | HR 80 | Temp 97.5°F | Resp 18 | Ht 67.5 in | Wt 104.2 lb

## 2013-12-28 DIAGNOSIS — F0280 Dementia in other diseases classified elsewhere without behavioral disturbance: Secondary | ICD-10-CM

## 2013-12-28 NOTE — Progress Notes (Signed)
Patient ID: Rachael Perez, female   DOB: June 05, 1926, 80 y.o.   MRN: [id number] Pt was in a hurry.  Has an appt in 3 hrs for event at her facility.  Was very anxious and insisted upon leaving today--I asked staff to reschedule her appt for next week.

## 2014-01-04 ENCOUNTER — Ambulatory Visit: Payer: Medicare Other | Admitting: Internal Medicine

## 2014-01-16 ENCOUNTER — Ambulatory Visit: Payer: Medicare Other | Admitting: Internal Medicine

## 2014-01-18 ENCOUNTER — Ambulatory Visit (INDEPENDENT_AMBULATORY_CARE_PROVIDER_SITE_OTHER): Payer: Medicare Other | Admitting: Internal Medicine

## 2014-01-18 ENCOUNTER — Other Ambulatory Visit: Payer: Self-pay | Admitting: Internal Medicine

## 2014-01-18 ENCOUNTER — Encounter: Payer: Self-pay | Admitting: Internal Medicine

## 2014-01-18 VITALS — BP 128/80 | HR 83 | Temp 97.5°F | Resp 10 | Wt 102.0 lb

## 2014-01-18 DIAGNOSIS — E039 Hypothyroidism, unspecified: Secondary | ICD-10-CM

## 2014-01-18 DIAGNOSIS — R5383 Other fatigue: Secondary | ICD-10-CM

## 2014-01-18 DIAGNOSIS — M810 Age-related osteoporosis without current pathological fracture: Secondary | ICD-10-CM

## 2014-01-18 DIAGNOSIS — F419 Anxiety disorder, unspecified: Secondary | ICD-10-CM

## 2014-01-18 DIAGNOSIS — R079 Chest pain, unspecified: Secondary | ICD-10-CM | POA: Diagnosis not present

## 2014-01-18 DIAGNOSIS — I868 Varicose veins of other specified sites: Secondary | ICD-10-CM | POA: Diagnosis not present

## 2014-01-18 DIAGNOSIS — F418 Other specified anxiety disorders: Secondary | ICD-10-CM | POA: Diagnosis not present

## 2014-01-18 DIAGNOSIS — I8390 Asymptomatic varicose veins of unspecified lower extremity: Secondary | ICD-10-CM

## 2014-01-18 DIAGNOSIS — F5105 Insomnia due to other mental disorder: Secondary | ICD-10-CM
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
## 2014-01-18 NOTE — Addendum Note (Signed)
Addended by: Jearld Adjutant on: 01/18/2014 02:20 PM   Modules accepted: Orders

## 2014-01-19 LAB — COMPREHENSIVE METABOLIC PANEL
ALT: 11 IU/L (ref 0–32)
AST: 23 IU/L (ref 0–40)
Albumin/Globulin Ratio: 1.9 (ref 1.1–2.5)
Albumin: 4.3 g/dL (ref 3.5–4.7)
Alkaline Phosphatase: 64 IU/L (ref 39–117)
BUN/Creatinine Ratio: 25 (ref 11–26)
BUN: 20 mg/dL (ref 8–27)
CO2: 26 mmol/L (ref 18–29)
Calcium: 9.6 mg/dL (ref 8.7–10.3)
Chloride: 98 mmol/L (ref 97–108)
Creatinine, Ser: 0.81 mg/dL (ref 0.57–1.00)
GFR calc Af Amer: 77 mL/min/{1.73_m2} (ref >59)
GFR calc non Af Amer: 66 mL/min/{1.73_m2} (ref >59)
Globulin, Total: 2.3 g/dL (ref 1.5–4.5)
Glucose: 96 mg/dL (ref 65–99)
Potassium: 4.3 mmol/L (ref 3.5–5.2)
Sodium: 138 mmol/L (ref 134–144)
Total Bilirubin: 0.6 mg/dL (ref 0.0–1.2)
Total Protein: 6.6 g/dL (ref 6.0–8.5)

## 2014-01-19 LAB — CBC WITH DIFFERENTIAL
Basophils Absolute: 0 10*3/uL (ref 0.0–0.2)
Basos: 1 %
Eos: 1 %
Eosinophils Absolute: 0.1 10*3/uL (ref 0.0–0.4)
HCT: 39.8 % (ref 34.0–46.6)
Hemoglobin: 13.3 g/dL (ref 11.1–15.9)
Immature Grans (Abs): 0 10*3/uL (ref 0.0–0.1)
Immature Granulocytes: 0 %
Lymphocytes Absolute: 1 10*3/uL (ref 0.7–3.1)
Lymphs: 17 %
MCH: 30.9 pg (ref 26.6–33.0)
MCHC: 33.4 g/dL (ref 31.5–35.7)
MCV: 93 fL (ref 79–97)
Monocytes Absolute: 0.6 10*3/uL (ref 0.1–0.9)
Monocytes: 10 %
Neutrophils Absolute: 4.4 10*3/uL (ref 1.4–7.0)
Neutrophils Relative %: 71 %
Platelets: 214 10*3/uL (ref 150–379)
RBC: 4.3 x10E6/uL (ref 3.77–5.28)
RDW: 13.6 % (ref 12.3–15.4)
WBC: 6.1 10*3/uL (ref 3.4–10.8)

## 2014-01-19 LAB — TSH: TSH: 0.842 u[IU]/mL (ref 0.450–4.500)

## 2014-01-19 LAB — VITAMIN D 25 HYDROXY (VIT D DEFICIENCY, FRACTURES): Vit D, 25-Hydroxy: 50.4 ng/mL (ref 30.0–100.0)

## 2014-01-30 ENCOUNTER — Emergency Department (HOSPITAL_COMMUNITY): Payer: Medicare Other

## 2014-01-30 ENCOUNTER — Inpatient Hospital Stay (HOSPITAL_COMMUNITY)
Admission: EM | Admit: 2014-01-30 | Discharge: 2014-02-02 | DRG: 071 | Disposition: A | Discharge status: Home / Self Care | Payer: Medicare Other | Attending: Internal Medicine | Admitting: Internal Medicine

## 2014-01-30 ENCOUNTER — Encounter (HOSPITAL_COMMUNITY): Payer: Self-pay | Admitting: Emergency Medicine

## 2014-01-30 DIAGNOSIS — N3000 Acute cystitis without hematuria: Secondary | ICD-10-CM

## 2014-01-30 DIAGNOSIS — F329 Major depressive disorder, single episode, unspecified: Secondary | ICD-10-CM | POA: Diagnosis present

## 2014-01-30 DIAGNOSIS — I672 Cerebral atherosclerosis: Secondary | ICD-10-CM | POA: Diagnosis not present

## 2014-01-30 DIAGNOSIS — R682 Dry mouth, unspecified: Secondary | ICD-10-CM | POA: Diagnosis not present

## 2014-01-30 DIAGNOSIS — F0280 Dementia in other diseases classified elsewhere without behavioral disturbance: Secondary | ICD-10-CM

## 2014-01-30 DIAGNOSIS — F0391 Unspecified dementia with behavioral disturbance: Secondary | ICD-10-CM | POA: Diagnosis present

## 2014-01-30 DIAGNOSIS — Z9841 Cataract extraction status, right eye: Secondary | ICD-10-CM

## 2014-01-30 DIAGNOSIS — J439 Emphysema, unspecified: Secondary | ICD-10-CM | POA: Diagnosis not present

## 2014-01-30 DIAGNOSIS — G4700 Insomnia, unspecified: Secondary | ICD-10-CM | POA: Diagnosis present

## 2014-01-30 DIAGNOSIS — F419 Anxiety disorder, unspecified: Secondary | ICD-10-CM

## 2014-01-30 DIAGNOSIS — N390 Urinary tract infection, site not specified: Secondary | ICD-10-CM | POA: Diagnosis present

## 2014-01-30 DIAGNOSIS — M1990 Unspecified osteoarthritis, unspecified site: Secondary | ICD-10-CM | POA: Diagnosis present

## 2014-01-30 DIAGNOSIS — Z87891 Personal history of nicotine dependence: Secondary | ICD-10-CM | POA: Diagnosis not present

## 2014-01-30 DIAGNOSIS — R4182 Altered mental status, unspecified: Secondary | ICD-10-CM | POA: Diagnosis not present

## 2014-01-30 DIAGNOSIS — G9340 Encephalopathy, unspecified: Secondary | ICD-10-CM | POA: Diagnosis present

## 2014-01-30 DIAGNOSIS — F0390 Unspecified dementia without behavioral disturbance: Secondary | ICD-10-CM | POA: Diagnosis not present

## 2014-01-30 DIAGNOSIS — F5105 Insomnia due to other mental disorder: Secondary | ICD-10-CM

## 2014-01-30 DIAGNOSIS — R410 Disorientation, unspecified: Secondary | ICD-10-CM

## 2014-01-30 DIAGNOSIS — Z7982 Long term (current) use of aspirin: Secondary | ICD-10-CM | POA: Diagnosis not present

## 2014-01-30 DIAGNOSIS — I639 Cerebral infarction, unspecified: Secondary | ICD-10-CM | POA: Diagnosis not present

## 2014-01-30 DIAGNOSIS — M810 Age-related osteoporosis without current pathological fracture: Secondary | ICD-10-CM | POA: Diagnosis present

## 2014-01-30 DIAGNOSIS — J984 Other disorders of lung: Secondary | ICD-10-CM | POA: Diagnosis not present

## 2014-01-30 DIAGNOSIS — Z9842 Cataract extraction status, left eye: Secondary | ICD-10-CM

## 2014-01-30 DIAGNOSIS — I379 Nonrheumatic pulmonary valve disorder, unspecified: Secondary | ICD-10-CM | POA: Diagnosis not present

## 2014-01-30 DIAGNOSIS — R404 Transient alteration of awareness: Secondary | ICD-10-CM | POA: Diagnosis not present

## 2014-01-30 DIAGNOSIS — I10 Essential (primary) hypertension: Secondary | ICD-10-CM | POA: Diagnosis present

## 2014-01-30 DIAGNOSIS — E860 Dehydration: Secondary | ICD-10-CM | POA: Diagnosis present

## 2014-01-30 DIAGNOSIS — Z79899 Other long term (current) drug therapy: Secondary | ICD-10-CM | POA: Diagnosis not present

## 2014-01-30 DIAGNOSIS — R531 Weakness: Secondary | ICD-10-CM | POA: Diagnosis not present

## 2014-01-30 DIAGNOSIS — I6623 Occlusion and stenosis of bilateral posterior cerebral arteries: Secondary | ICD-10-CM | POA: Diagnosis not present

## 2014-01-30 DIAGNOSIS — I160 Hypertensive urgency: Secondary | ICD-10-CM

## 2014-01-30 DIAGNOSIS — F03918 Unspecified dementia, unspecified severity, with other behavioral disturbance: Secondary | ICD-10-CM

## 2014-01-30 LAB — COMPREHENSIVE METABOLIC PANEL
ALBUMIN: 3.9 g/dL (ref 3.5–5.2)
ALT: 15 U/L (ref 0–35)
AST: 27 U/L (ref 0–37)
Alkaline Phosphatase: 56 U/L (ref 39–117)
Anion gap: 14 (ref 5–15)
BUN: 21 mg/dL (ref 6–23)
CALCIUM: 9.4 mg/dL (ref 8.4–10.5)
CO2: 27 mEq/L (ref 19–32)
Chloride: 94 mEq/L — ABNORMAL LOW (ref 96–112)
Creatinine, Ser: 0.79 mg/dL (ref 0.50–1.10)
GFR calc non Af Amer: 74 mL/min — ABNORMAL LOW (ref >90)
GFR, EST AFRICAN AMERICAN: 86 mL/min — AB (ref >90)
GLUCOSE: 95 mg/dL (ref 70–99)
Potassium: 4 mEq/L (ref 3.7–5.3)
Sodium: 135 mEq/L — ABNORMAL LOW (ref 137–147)
TOTAL PROTEIN: 7.2 g/dL (ref 6.0–8.3)
Total Bilirubin: 1 mg/dL (ref 0.3–1.2)

## 2014-01-30 LAB — URINALYSIS, ROUTINE W REFLEX MICROSCOPIC
BILIRUBIN URINE: NEGATIVE
Glucose, UA: NEGATIVE mg/dL
Ketones, ur: 15 mg/dL — AB
NITRITE: NEGATIVE
PROTEIN: NEGATIVE mg/dL
Specific Gravity, Urine: 1.012 (ref 1.005–1.030)
Urobilinogen, UA: 1 mg/dL (ref 0.0–1.0)
pH: 6 (ref 5.0–8.0)

## 2014-01-30 LAB — CBC WITH DIFFERENTIAL/PLATELET
Basophils Absolute: 0 10*3/uL (ref 0.0–0.1)
Basophils Relative: 0 % (ref 0–1)
EOS ABS: 0 10*3/uL (ref 0.0–0.7)
Eosinophils Relative: 1 % (ref 0–5)
HCT: 41.3 % (ref 36.0–46.0)
HEMOGLOBIN: 14.3 g/dL (ref 12.0–15.0)
Lymphocytes Relative: 15 % (ref 12–46)
Lymphs Abs: 1 10*3/uL (ref 0.7–4.0)
MCH: 31.4 pg (ref 26.0–34.0)
MCHC: 34.6 g/dL (ref 30.0–36.0)
MCV: 90.6 fL (ref 78.0–100.0)
MONO ABS: 0.7 10*3/uL (ref 0.1–1.0)
MONOS PCT: 10 % (ref 3–12)
NEUTROS ABS: 5.1 10*3/uL (ref 1.7–7.7)
NEUTROS PCT: 74 % (ref 43–77)
Platelets: 247 10*3/uL (ref 150–400)
RBC: 4.56 MIL/uL (ref 3.87–5.11)
RDW: 12.8 % (ref 11.5–15.5)
WBC: 6.9 10*3/uL (ref 4.0–10.5)

## 2014-01-30 LAB — ETHANOL: Alcohol, Ethyl (B): <11 mg/dL (ref 0–11)

## 2014-01-30 LAB — URINE MICROSCOPIC-ADD ON

## 2014-01-30 LAB — LIPASE, BLOOD: LIPASE: 66 U/L — AB (ref 11–59)

## 2014-01-30 MED ORDER — ASPIRIN 81 MG PO CHEW
324.0000 mg | CHEWABLE_TABLET | Freq: Once | ORAL | Status: AC
  Administered 2014-01-30: 324 mg via ORAL
  Filled 2014-01-30: qty 4

## 2014-01-30 MED ORDER — SODIUM CHLORIDE 0.9 % IV SOLN
1000.0000 mL | Freq: Once | INTRAVENOUS | Status: AC
  Administered 2014-01-30: 1000 mL via INTRAVENOUS

## 2014-01-30 MED ORDER — SODIUM CHLORIDE 0.9 % IV SOLN
1000.0000 mL | INTRAVENOUS | Status: DC
  Administered 2014-01-30 – 2014-01-31 (×2): 1000 mL via INTRAVENOUS

## 2014-01-30 MED ORDER — HYDRALAZINE HCL 20 MG/ML IJ SOLN
INTRAMUSCULAR | Status: AC
  Filled 2014-01-30: qty 1

## 2014-01-30 MED ORDER — ASPIRIN 325 MG PO TABS
325.0000 mg | ORAL_TABLET | Freq: Every day | ORAL | Status: DC
  Administered 2014-01-31 – 2014-02-02 (×3): 325 mg via ORAL
  Filled 2014-01-30 (×3): qty 1

## 2014-01-30 MED ORDER — HYDRALAZINE HCL 20 MG/ML IJ SOLN
10.0000 mg | Freq: Four times a day (QID) | INTRAMUSCULAR | Status: DC | PRN
  Administered 2014-01-30: 10 mg via INTRAVENOUS

## 2014-01-30 MED ORDER — STROKE: EARLY STAGES OF RECOVERY BOOK
Freq: Once | Status: DC
  Filled 2014-01-30: qty 1

## 2014-01-30 MED ORDER — ENOXAPARIN SODIUM 40 MG/0.4ML ~~LOC~~ SOLN
40.0000 mg | SUBCUTANEOUS | Status: DC
  Administered 2014-01-30 – 2014-02-01 (×3): 40 mg via SUBCUTANEOUS
  Filled 2014-01-30 (×3): qty 0.4

## 2014-01-30 MED ORDER — SENNOSIDES-DOCUSATE SODIUM 8.6-50 MG PO TABS: 1.0000 | ORAL_TABLET | Freq: Every evening | ORAL | Status: DC | PRN

## 2014-01-30 NOTE — Progress Notes (Signed)
Per report from ER, vitals reported with systolic greater than 891.  Receiving nurse expressing concern regarding safe acceptance of patient, called rapid response to evaluate patient. Will go to check on patient.

## 2014-01-30 NOTE — ED Notes (Signed)
Patient transported to CT 

## 2014-01-30 NOTE — ED Notes (Signed)
Pt presents to department via [facility] from [place] for evaluation of dizziness, confusion and generalized weakness. Speech clear, strong bilateral grip strengths, but patient having difficulty answering questions correctly, has trouble forming sentences. CBG 123.

## 2014-01-30 NOTE — ED Provider Notes (Addendum)
CSN: [id number]     Arrival date & time 01/30/14  1210 History   First MD Initiated Contact with Patient 01/30/14 1211     Chief Complaint  Patient presents with  . Dizziness     Level V caveat: Dementia  HPI Patient reports that today she "felt funny".  She was sent to the emergency department from [place] for increased confusion, generalized weakness and dizziness.  His reported that the patient was having difficulty answering some questions and forming sentences appropriately.  Unclear as to when the patient was last seen normal.  No family is available for consultation at this time.  I was not present when EMS got the patient off.  Patient denies chest pain or shortness of breath.  No prior history of stroke.  Denies headache.  Denies abdominal pain.  No urinary complaints.   Past Medical History  Diagnosis Date  . Hypertension   . Palpitations   . SOB (shortness of breath) on exertion   . History of medication noncompliance   . Varicose veins   . Wears hearing aid   . Abnormal chest CT     Lesion is unchanged; Followed by Dr. Benay Spice  . Senile osteoporosis   . Anxiety   . Breast cancer 08/1994    Remote lumpectomy on L  . Breast cancer 05/2011    R breast with lumpectomy and XRT therapy  . Arthritis   . Vitamin D deficiency   . Other B-complex deficiencies   . Dementia in conditions classified elsewhere without behavioral disturbance   . Malignant neoplasm of breast (female), unspecified site   . Chronic pain   . Varicose veins of lower extremities with inflammation   . Hallucinations   . Insomnia, unspecified    Past Surgical History  Procedure Laterality Date  . Tonsillectomy    . Tubal ligation  1980  . Breast lumpectomy  09/01/2011  . Cataract extraction, bilateral    . US echocardiography  12/17/2009    EF 55-60%  . Breast lumpectomy  1996    left breast  . Varicose vein removal  1970   Family History  Problem Relation Age of  Onset  . Heart disease Mother   . Heart disease Father   . Heart disease Sister   . Heart disease Brother   . Arthritis Brother   . Heart disease Sister   . Alzheimer's disease Sister   . Cancer Neg Hx    History  Substance Use Topics  . Smoking status: Former Smoker    Types: Cigarettes    Quit date: 04/20/1952  . Smokeless tobacco: Never Used  . Alcohol Use: Yes     Comment: "I occasionally drink wine."   OB History   Grav Para Term Preterm Abortions TAB SAB Ect Mult Living                 Review of Systems  Unable to perform ROS: Dementia      Allergies  Codeine and Lodine  Home Medications   Prior to Admission medications   Medication Sig Start Date End Date Taking? Authorizing Provider  aspirin 81 MG tablet Take 81 mg by mouth daily.   Yes Historical Provider, MD  MELATONIN PO Take by mouth at bedtime.   Yes Historical Provider, MD   BP 195/74  Pulse 69  Resp 20  SpO2 97% Physical Exam  Nursing note and vitals reviewed. Constitutional: She appears well-developed and well-nourished. No distress.  HENT:  Head: Normocephalic and atraumatic.  Eyes: EOM are normal.  Neck: Normal range of motion.  Cardiovascular: Normal rate, regular rhythm and normal heart sounds.   Pulmonary/Chest: Effort normal and breath sounds normal.  Abdominal: Soft. She exhibits no distension. There is no tenderness.  Musculoskeletal: Normal range of motion.  Neurological: She is alert.  Oriented x2.  5 out of 5 strength in bilateral upper and lower extremity major muscle groups  Skin: Skin is warm and dry.  Psychiatric: She has a normal mood and affect. Judgment normal.    ED Course  Procedures (including critical care time) Labs Review Labs Reviewed  URINALYSIS, ROUTINE W REFLEX MICROSCOPIC - Abnormal; Notable for the following:    Hgb urine dipstick TRACE (*)    Ketones, ur 15 (*)    Leukocytes, UA SMALL (*)    All other components within normal limits  COMPREHENSIVE  METABOLIC PANEL - Abnormal; Notable for the following:    Sodium 135 (*)    Chloride 94 (*)    GFR calc non Af Amer 74 (*)    GFR calc Af Amer 86 (*)    All other components within normal limits  LIPASE, BLOOD - Abnormal; Notable for the following:    Lipase 66 (*)    All other components within normal limits  URINE MICROSCOPIC-ADD ON - Abnormal; Notable for the following:    Bacteria, UA FEW (*)    Casts HYALINE CASTS (*)    All other components within normal limits  CBC WITH DIFFERENTIAL  ETHANOL    Imaging Review Dg Chest 2 View  01/30/2014   CLINICAL DATA:  Altered mental status and weakness. History of breast carcinoma  EXAM: CHEST  2 VIEW  COMPARISON:  Chest radiograph December 10, 2009 and chest CT June 01, 2011  FINDINGS: There is underlying emphysematous change. There are multiple areas of scarring and fibrotic change in both upper lobes, essentially stable. There is no frank edema or consolidation. Heart size is normal. Pulmonary vascularity is consistent with the underlying emphysematous change. There are multiple surgical clips on the left, stable. There is no adenopathy. There is atherosclerotic change in the aorta. There are no appreciable bone lesions.  IMPRESSION: Underlying emphysema. Scarring with areas of fibrotic type change in both upper lobes. No frank edema or consolidation. No change in cardiac silhouette.   Electronically Signed   By: Lowella Grip M.D.   On: 01/30/2014 14:13   Ct Head Wo Contrast  01/30/2014   CLINICAL DATA:  Altered mental status/confusion, progressed recently  EXAM: CT HEAD WITHOUT CONTRAST  TECHNIQUE: Contiguous axial images were obtained from the base of the skull through the vertex without intravenous contrast.  COMPARISON:  None.  FINDINGS: There is mild diffuse atrophy. There is no appreciable mass, hemorrhage, extra-axial fluid collection, or midline shift. There is patchy small vessel disease in the centra semiovale bilaterally. There  is a small focus of decreased attenuation in the posterior limb of the left extreme capsule which may represent a small acute infarct. This finding is seen on slice 13 series 093. No other findings concerning for potential acute infarct present. Bony calvarium appears intact. Mastoid air cells are clear. There is opacification of a mid left ethmoid air cell. There are vertebral artery calcifications bilaterally.  IMPRESSION: Atrophy with periventricular small vessel disease. Suspect small acute infarct in the posterior limb of the left extreme capsule. No hemorrhage or mass effect. Left ethmoid sinus disease. Bilateral vertebral artery calcification.  Electronically Signed   By: Lowella Grip M.D.   On: 01/30/2014 13:57  I personally reviewed the imaging tests through PACS system I reviewed available ER/hospitalization records through the EMR   ECG interpretation   Date: 01/30/2014  Rate: 73  Rhythm: normal sinus rhythm  QRS Axis: normal  Intervals: normal  ST/T Wave abnormalities: normal  Conduction Disutrbances: none  Narrative Interpretation:   Old EKG Reviewed: No significant changes noted     MDM   Final diagnoses:  None    Appears to be acute stroke in the left brain.  Patient will be admitted the hospital for additional workup.  She'll need an MRI.  Neuro hospitalist consultation.  Hospitalist to admit    Hoy Morn, MD 01/30/14 [place], MD 01/30/14 [phone]

## 2014-01-30 NOTE — ED Notes (Signed)
Pt assisted to use bed pan.

## 2014-01-30 NOTE — ED Notes (Signed)
Lab work hemolyzed, to be re-drawn, Therapist, sports notified.

## 2014-01-30 NOTE — H&P (Signed)
[facility] History and Physical  Rachael Perez WPY:[id number] DOB: Dec 09, 1926 DOA: 01/30/2014  Referring physician:  PCP: Hollace Kinnier, DO  Specialists:   Chief Complaint: Confusion  HPI: Rachael Perez is a 80 y.o. female  With a history of hypertension, breast cancer, dementia that presented to the emergency department from assisted living community.  Patient was sent to emergency department for confusion. Per patient's son, who is at bedside, he stated that he saw her yesterday evening and she was in her normal and usual state of health. Patient has not had any recent complaints of abdominal pain diarrhea or constipation, chest pain, shortness of breath. He states that she is currently at her baseline. Patient does have an [place] accent, and normally has pressured speech. At this time, patient states she "feels fine" and would like something to eat. She currently has no complaints.  In the emergency room, CT of the head was conducted showing possible small posterior limb infarct.  [facility] was called for admission.  Review of Systems:  Unable to obtain secondary to patient's dementia.  Past Medical History  Diagnosis Date  . Hypertension   . Palpitations   . SOB (shortness of breath) on exertion   . History of medication noncompliance   . Varicose veins   . Wears hearing aid   . Abnormal chest CT     Lesion is unchanged; Followed by Dr. Benay Spice  . Senile osteoporosis   . Anxiety   . Breast cancer 08/1994    Remote lumpectomy on L  . Breast cancer 05/2011    R breast with lumpectomy and XRT therapy  . Arthritis   . Vitamin D deficiency   . Other B-complex deficiencies   . Dementia in conditions classified elsewhere without behavioral disturbance   . Malignant neoplasm of breast (female), unspecified site   . Chronic pain   . Varicose veins of lower extremities with inflammation   . Hallucinations   . Insomnia, unspecified    Past Surgical History    Procedure Laterality Date  . Tonsillectomy    . Tubal ligation  1980  . Breast lumpectomy  09/01/2011  . Cataract extraction, bilateral    . US echocardiography  12/17/2009    EF 55-60%  . Breast lumpectomy  1996    left breast  . Varicose vein removal  1970   Social History:  reports that she quit smoking about 61 years ago. Her smoking use included Cigarettes. She smoked 0.00 packs per day. She has never used smokeless tobacco. She reports that she drinks alcohol. She reports that she does not use illicit drugs. Patient resides in an assisted-living community.  Allergies  Allergen Reactions  . Codeine Shortness Of Breath  . Lodine [Etodolac]     Family History  Problem Relation Age of Onset  . Heart disease Mother   . Heart disease Father   . Heart disease Sister   . Heart disease Brother   . Arthritis Brother   . Heart disease Sister   . Alzheimer's disease Sister   . Cancer Neg Hx      Prior to Admission medications   Medication Sig Start Date End Date Taking? Authorizing Provider  aspirin 81 MG tablet Take 81 mg by mouth daily.   Yes Historical Provider, MD  MELATONIN PO Take by mouth at bedtime.   Yes Historical Provider, MD   Physical Exam: Filed Vitals:   01/30/14 1500  BP: 195/74  Pulse: 69  Resp: 20  General: Well developed, well nourished, NAD, appears stated age  HEENT: NCAT, PERRLA, EOMI, Anicteic Sclera, mucous membranes moist.   Neck: Supple, no JVD, no masses  Cardiovascular: S1 S2 auscultated, no rubs, murmurs or gallops. Regular rate and rhythm.  Respiratory: Clear to auscultation bilaterally with equal chest rise  Abdomen: Soft, nontender, nondistended, + bowel sounds  Extremities: warm dry without cyanosis clubbing or edema  Neuro: AAOx2, cranial nerves grossly intact. Strength equal and bilateral in the upper/lower ext  Skin: Without rashes exudates or nodules  Psych: Confused  Labs on Admission:  Basic Metabolic  Panel:  Recent Labs Lab 01/30/14 1420  NA 135*  K 4.0  CL 94*  CO2 27  GLUCOSE 95  BUN 21  CREATININE 0.79  CALCIUM 9.4   Liver Function Tests:  Recent Labs Lab 01/30/14 1420  AST 27  ALT 15  ALKPHOS 56  BILITOT 1.0  PROT 7.2  ALBUMIN 3.9    Recent Labs Lab 01/30/14 1420  LIPASE 66*   No results found for this basename: AMMONIA,  in the last 168 hours CBC:  Recent Labs Lab 01/30/14 1225  WBC 6.9  NEUTROABS 5.1  HGB 14.3  HCT 41.3  MCV 90.6  PLT 247   Cardiac Enzymes: No results found for this basename: CKTOTAL, CKMB, CKMBINDEX, TROPONINI,  in the last 168 hours  BNP (last 3 results) No results found for this basename: PROBNP,  in the last 8760 hours CBG: No results found for this basename: GLUCAP,  in the last 168 hours  Radiological Exams on Admission: Dg Chest 2 View  01/30/2014   CLINICAL DATA:  Altered mental status and weakness. History of breast carcinoma  EXAM: CHEST  2 VIEW  COMPARISON:  Chest radiograph December 10, 2009 and chest CT June 01, 2011  FINDINGS: There is underlying emphysematous change. There are multiple areas of scarring and fibrotic change in both upper lobes, essentially stable. There is no frank edema or consolidation. Heart size is normal. Pulmonary vascularity is consistent with the underlying emphysematous change. There are multiple surgical clips on the left, stable. There is no adenopathy. There is atherosclerotic change in the aorta. There are no appreciable bone lesions.  IMPRESSION: Underlying emphysema. Scarring with areas of fibrotic type change in both upper lobes. No frank edema or consolidation. No change in cardiac silhouette.   Electronically Signed   By: Lowella Grip M.D.   On: 01/30/2014 14:13   Ct Head Wo Contrast  01/30/2014   CLINICAL DATA:  Altered mental status/confusion, progressed recently  EXAM: CT HEAD WITHOUT CONTRAST  TECHNIQUE: Contiguous axial images were obtained from the base of the skull  through the vertex without intravenous contrast.  COMPARISON:  None.  FINDINGS: There is mild diffuse atrophy. There is no appreciable mass, hemorrhage, extra-axial fluid collection, or midline shift. There is patchy small vessel disease in the centra semiovale bilaterally. There is a small focus of decreased attenuation in the posterior limb of the left extreme capsule which may represent a small acute infarct. This finding is seen on slice 13 series 657. No other findings concerning for potential acute infarct present. Bony calvarium appears intact. Mastoid air cells are clear. There is opacification of a mid left ethmoid air cell. There are vertebral artery calcifications bilaterally.  IMPRESSION: Atrophy with periventricular small vessel disease. Suspect small acute infarct in the posterior limb of the left extreme capsule. No hemorrhage or mass effect. Left ethmoid sinus disease. Bilateral vertebral artery calcification.   Electronically  Signed   By: Lowella Grip M.D.   On: 01/30/2014 13:57    EKG: None  Assessment/Plan  Acute CVA -Patient will be admitted to telemetry -CT head: Suspect small acute infarct in the posterior limb of the left extreme capsule -Will obtain MRI of the brain and MRA of the head, echocardiogram, carotid Doppler, hemoglobin A1c, lipid panel -Will consult neurology -Will consult PT, OT, speech -Will order aspirin  Hypertension -Will permissive hypertension at this time -Will add on medications if needed  Dementia with depression -Patient was recently started on Brintellix, however her son does not know whether she is actually taking this or not. -Patient did not need medications for her dementia.  Insomnia -Will hold melatonin at this time.  DVT prophylaxis: Lovenox  Code Status: Full  Condition: guarded  Family Communication: Son at bedside.  Admission, patients condition and plan of care including tests being ordered have been discussed with the  patient's son who indicates understanding and agrees with the plan and Code Status.  Disposition Plan: Admitted  Time spent: 60 minutes  Alcee Sipos D.O. [facility] Pager [phone]  If 7PM-7AM, please contact night-coverage [email or web address] Password [email or web address] 01/30/2014, 3:47 PM

## 2014-01-30 NOTE — Consult Note (Signed)
Referring Physician: Dr. Ree Kida    Chief Complaint: AMS  HPI: Rachael Perez is an 80 y.o. female pmh as listed below notable for HTN, dementia, anxiety, and breast cancer s/p lumpectomy, radiation, and tamoxifen in remission since 2003 p/w AMS as found by [facility] at [facility]. Pt was not accompanied by any family members but son, [name] whom is HCPOA was called and stated that staff at [facility] living informed him that she had been acting abnormal and was being sent to ED. He states that he has noticed more memory decline and increased uncontrolled anxiety in the patient. He states that he believes she has been taking a new anxiolytic for the past week but the patient has continued to independently manage her meds and she is not on anything for dementia to his knowledge. He has also noticed some changes in her behavior. He reports her baseline as having problems with orientation in regards to time and people and trouble following flow of conversation. Nurse at [facility] was called to receive collateral information. She stated that some of the fellow residents called for help when they noticed the patient opening her door and "acting weird." Upon the scene pt was confused couldn't recall her son's name and was complaining of HA, eye pain, and blurry vision. The nurse didn't notice any facial droop or unilateral smile, the patient was using all her limbs independently and she didn't notice any weakness or abnormal gait. Thinking this was 2/2 possible dehydration as the nurse noticed the patient to have decreased po intake over couple of days was given some tea but she didn't improve and was then sent by EMS to the hospital.   Time last known well: 1100 tPA Given: No: outside window  Past Medical History  Diagnosis Date  . Hypertension   . Palpitations   . SOB (shortness of breath) on exertion   . History of medication noncompliance   . Varicose veins   . Wears  hearing aid   . Abnormal chest CT     Lesion is unchanged; Followed by Dr. Benay Spice  . Senile osteoporosis   . Anxiety   . Breast cancer 08/1994    Remote lumpectomy on L  . Breast cancer 05/2011    R breast with lumpectomy and XRT therapy  . Arthritis   . Vitamin D deficiency   . Other B-complex deficiencies   . Dementia in conditions classified elsewhere without behavioral disturbance   . Malignant neoplasm of breast (female), unspecified site   . Chronic pain   . Varicose veins of lower extremities with inflammation   . Hallucinations   . Insomnia, unspecified     Past Surgical History  Procedure Laterality Date  . Tonsillectomy    . Tubal ligation  1980  . Breast lumpectomy  09/01/2011  . Cataract extraction, bilateral    . US echocardiography  12/17/2009    EF 55-60%  . Breast lumpectomy  1996    left breast  . Varicose vein removal  1970    Family History  Problem Relation Age of Onset  . Heart disease Mother   . Heart disease Father   . Heart disease Sister   . Heart disease Brother   . Arthritis Brother   . Heart disease Sister   . Alzheimer's disease Sister   . Cancer Neg Hx    Social History:  reports that she quit smoking about 61 years ago. Her smoking use included Cigarettes. She smoked  0.00 packs per day. She has never used smokeless tobacco. She reports that she drinks alcohol. She reports that she does not use illicit drugs.  Allergies:  Allergies  Allergen Reactions  . Codeine Shortness Of Breath  . Lodine [Etodolac]     Medications: unknown and nurse at facility didn't have access  ROS: complete unless listed in HPI  Physical Examination: Blood pressure 202/72, pulse 69, resp. rate 19, SpO2 96.00%. General: Mental Status: Alert, oriented to self and place but not time, thought content appropriate.  Speech is stressed and pt exhibits word substitution error. Able to follow 3 step commands without difficulty.   Cranial Nerves: II: Discs flat  bilaterally; Visual fields grossly normal, pupils equal, round, reactive to light and accommodation III,IV, VI: ptosis not present, extra-ocular motions intact bilaterally V,VII: smile symmetric, facial light touch sensation normal bilaterally VIII: hearing normal bilaterally IX,X: gag reflex present XI: bilateral shoulder shrug XII: midline tongue extension without atrophy or fasciculations  Motor: Right : Upper extremity   5/5    Left:     Upper extremity   5/5  Lower extremity   5/5     Lower extremity   5/5 Tone and bulk:normal tone throughout; no atrophy noted Sensory: Pinprick and light touch intact throughout, bilaterally Deep Tendon Reflexes:  Right: Upper Extremity   Left: Upper extremity   biceps (C-5 to C-6) 2/4   biceps (C-5 to C-6) 2/4 tricep (C7) 2/4    triceps (C7) 2/4 Brachioradialis (C6) 2/4  Brachioradialis (C6) 2/4  Lower Extremity Lower Extremity  quadriceps (L-2 to L-4) 2/4   quadriceps (L-2 to L-4) 2/4 Achilles (S1) 2/4   Achilles (S1) 2/4  Plantars: Right: downgoing   Left: downgoing Cerebellar: normal finger-to-nose,  normal heel-to-shin test  CV: pulses palpable throughout, RRR, no carotid bruits noted Resp: CTAB, no crackles, wheezes or rhonchi Abd: soft, nt, nd, no organomegaly  Laboratory Studies:  Basic Metabolic Panel:  Recent Labs Lab 01/30/14 1420  NA 135*  K 4.0  CL 94*  CO2 27  GLUCOSE 95  BUN 21  CREATININE 0.79  CALCIUM 9.4    Liver Function Tests:  Recent Labs Lab 01/30/14 1420  AST 27  ALT 15  ALKPHOS 56  BILITOT 1.0  PROT 7.2  ALBUMIN 3.9    Recent Labs Lab 01/30/14 1420  LIPASE 66*   CBC:  Recent Labs Lab 01/30/14 1225  WBC 6.9  NEUTROABS 5.1  HGB 14.3  HCT 41.3  MCV 90.6  PLT 247   Urinalysis:  Recent Labs Lab 01/30/14 1413  COLORURINE YELLOW  LABSPEC 1.012  PHURINE 6.0  GLUCOSEU NEGATIVE  HGBUR TRACE*  BILIRUBINUR NEGATIVE  KETONESUR 15*  PROTEINUR NEGATIVE  UROBILINOGEN 1.0  NITRITE  NEGATIVE  LEUKOCYTESUR SMALL*   Lipid Panel:    Component Value Date/Time   CHOL 198 12/08/2010 1621   TRIG 104.0 12/08/2010 1621   HDL 67.80 12/08/2010 1621   CHOLHDL 3 12/08/2010 1621   VLDL 20.8 12/08/2010 1621   LDLCALC 109* 12/08/2010 1621    Alcohol Level:  Recent Labs Lab 01/30/14 1348  ETH <11    Other results: EKG: normal EKG, normal sinus rhythm, unchanged from previous tracings.  Imaging: Dg Chest 2 View  01/30/2014   CLINICAL DATA:  Altered mental status and weakness. History of breast carcinoma  EXAM: CHEST  2 VIEW  COMPARISON:  Chest radiograph December 10, 2009 and chest CT June 01, 2011  FINDINGS: There is underlying emphysematous change. There are  multiple areas of scarring and fibrotic change in both upper lobes, essentially stable. There is no frank edema or consolidation. Heart size is normal. Pulmonary vascularity is consistent with the underlying emphysematous change. There are multiple surgical clips on the left, stable. There is no adenopathy. There is atherosclerotic change in the aorta. There are no appreciable bone lesions.  IMPRESSION: Underlying emphysema. Scarring with areas of fibrotic type change in both upper lobes. No frank edema or consolidation. No change in cardiac silhouette.   Electronically Signed   By: Lowella Grip M.D.   On: 01/30/2014 14:13   Ct Head Wo Contrast  01/30/2014   CLINICAL DATA:  Altered mental status/confusion, progressed recently  EXAM: CT HEAD WITHOUT CONTRAST  TECHNIQUE: Contiguous axial images were obtained from the base of the skull through the vertex without intravenous contrast.  COMPARISON:  None.  FINDINGS: There is mild diffuse atrophy. There is no appreciable mass, hemorrhage, extra-axial fluid collection, or midline shift. There is patchy small vessel disease in the centra semiovale bilaterally. There is a small focus of decreased attenuation in the posterior limb of the left extreme capsule which may represent a  small acute infarct. This finding is seen on slice 13 series 503. No other findings concerning for potential acute infarct present. Bony calvarium appears intact. Mastoid air cells are clear. There is opacification of a mid left ethmoid air cell. There are vertebral artery calcifications bilaterally.  IMPRESSION: Atrophy with periventricular small vessel disease. Suspect small acute infarct in the posterior limb of the left extreme capsule. No hemorrhage or mass effect. Left ethmoid sinus disease. Bilateral vertebral artery calcification.   Electronically Signed   By: Lowella Grip M.D.   On: 01/30/2014 13:57    Assessment: 80 y.o. woman pmh most significant for HTN, dementia, and anxiety p/w AMS as witnessed by her fellow residents and staff with possibility of acute stroke.   Stroke Risk Factors - family history, hyperlipidemia and hypertension  Plan: 1. HgbA1c, fasting lipid panel 2. MRI, MRA  of the brain without contrast 3. PT consult, OT consult, Speech consult 4. Echocardiogram 5. Carotid dopplers 6. Telemetry monitoring 7. Frequent neuro checks 8. Recommended to confirm and stress with Burna Forts that pt should NOT be managing any of her medications   Clinton Gallant, MD  IM PGY-3 Pgr: [phone] 01/30/2014, 4:57 PM  I have seen and evaluated the patient. I have reviewed the above note and made appropriate changes.   80 yo F with dementia and increased confusion. She does have some word substitution errors which could represent an aphasia and the fibers seen on CT could be involved in language association.   She lives in an independent living facility.   She is unable to repeat and makes frequent word substitution errors. She has intact cranial nerves II - XII and no focal weakness or numbness. Intention tremor on FNF.   A/p: 80 yo with increased confusion. Possible etiologies include worsening of underlying dementia, infection, new stroke with language deficits. Will get stroke  workup and MRI brain.   Roland Rack, MD [facility] [phone]  If 7pm- 7am, please page neurology on call as listed in [place].

## 2014-01-31 ENCOUNTER — Inpatient Hospital Stay (HOSPITAL_COMMUNITY): Payer: Medicare Other

## 2014-01-31 DIAGNOSIS — N390 Urinary tract infection, site not specified: Secondary | ICD-10-CM | POA: Diagnosis present

## 2014-01-31 DIAGNOSIS — F419 Anxiety disorder, unspecified: Secondary | ICD-10-CM

## 2014-01-31 DIAGNOSIS — F0391 Unspecified dementia with behavioral disturbance: Secondary | ICD-10-CM

## 2014-01-31 DIAGNOSIS — N3000 Acute cystitis without hematuria: Secondary | ICD-10-CM

## 2014-01-31 DIAGNOSIS — G9340 Encephalopathy, unspecified: Secondary | ICD-10-CM | POA: Diagnosis present

## 2014-01-31 DIAGNOSIS — F5105 Insomnia due to other mental disorder: Secondary | ICD-10-CM

## 2014-01-31 DIAGNOSIS — I160 Hypertensive urgency: Secondary | ICD-10-CM | POA: Diagnosis present

## 2014-01-31 DIAGNOSIS — I379 Nonrheumatic pulmonary valve disorder, unspecified: Secondary | ICD-10-CM

## 2014-01-31 DIAGNOSIS — F03918 Unspecified dementia, unspecified severity, with other behavioral disturbance: Secondary | ICD-10-CM | POA: Diagnosis present

## 2014-01-31 LAB — LIPID PANEL
CHOL/HDL RATIO: 2.6 ratio
Cholesterol: 182 mg/dL (ref 0–200)
HDL: 71 mg/dL (ref >39)
LDL Cholesterol: 97 mg/dL (ref 0–99)
TRIGLYCERIDES: 70 mg/dL (ref <150)
VLDL: 14 mg/dL (ref 0–40)

## 2014-01-31 LAB — HEMOGLOBIN A1C
Hgb A1c MFr Bld: 5.8 % — ABNORMAL HIGH (ref <5.7)
Mean Plasma Glucose: 120 mg/dL — ABNORMAL HIGH (ref <117)

## 2014-01-31 MED ORDER — AMLODIPINE BESYLATE 5 MG PO TABS
5.0000 mg | ORAL_TABLET | Freq: Every day | ORAL | Status: DC
  Administered 2014-01-31 – 2014-02-01 (×2): 5 mg via ORAL
  Filled 2014-01-31 (×2): qty 1

## 2014-01-31 MED ORDER — CEFUROXIME AXETIL 500 MG PO TABS
250.0000 mg | ORAL_TABLET | Freq: Two times a day (BID) | ORAL | Status: DC
  Administered 2014-01-31 – 2014-02-02 (×4): 250 mg via ORAL
  Filled 2014-01-31 (×4): qty 0.5

## 2014-01-31 MED ORDER — CIPROFLOXACIN HCL 500 MG PO TABS: 500.0000 mg | ORAL_TABLET | Freq: Two times a day (BID) | ORAL | Status: DC

## 2014-01-31 NOTE — Progress Notes (Signed)
Report received from ED RN- Ebony Hail regarding pt. Expressed concern regarding Pt's SBP trending up and >200 SBP at the moment. ED nurse denied giving pt any PRN antihypertensive med. Notified charge Therapist, sports.

## 2014-01-31 NOTE — Progress Notes (Signed)
Patient ID: Rachael Perez, female   DOB: 1926/06/20, 80 y.o.   MRN: [id number] Pt was started on brintellix due to severe anxiety at this visit (samples provided with specific instructions written on her AVS).  She takes only calcium, melatonin and asa 81 mg regularly and has not had difficulty managing these few otc agents up to this point.

## 2014-01-31 NOTE — Progress Notes (Signed)
[facility] PROGRESS NOTE  Rachael Perez NOM:[id number] DOB: February 15, 1927 DOA: 01/30/2014 PCP: Hollace Kinnier, DO  Assessment/Plan: 1. Acute encephalopathy -Possibilities include TIA, hypertensive encephalopathy, underlying infectious process. -MRI of the brain did not show evidence of acute CVA -Chest x-ray did not show evidence of pneumonia however urinalysis did show the presence of leukocytes and a few bacteria. Will treat empirically for possible underlying urinary tract infection  2.  Possible urinary tract infection -Patient with history of dementia presenting with acute encephalopathy -Urinalysis showing the presence of a few bacteria and leukocytes. -Given recent decline will treat empirically with Ceftin  3.  Hypertension. -Patient's blood pressures improving, with last blood pressure reading of 151/60 -Initially had systolic blood pressure of 214 -Starting Norvasc 5 mg by mouth daily  4. Dementia -Spoke with patient's son, patient having progression of dementia over the past year. Had a steep functional decline after relocating to a new area. Will need assistance with medication management  -Currently resides at assisted living facility   Code Status: Full Code Family Communication: I spoke with her son over telephone  Disposition Plan: Anticipate discharge in the next 24 hours   Consultants:  Neurology  Procedures:    Antibiotics:  Ceftin  HPI/Subjective: Patient is a pleasant 80 year old female with a past medical history of hypertension, cognitive impairment, who was admitted to the medicine service on 01/30/2014. She was found to have mental status changes at her assisted-living facility for which she was transferred to the emergency department at [facility] for further evaluation. Patient was seen and evaluated by neurology, as she underwent stroke workup. An MRI of the brain did not reveal acute stroke.   Objective: Filed Vitals:    01/31/14 1433  BP: 151/68  Pulse: 101  Temp: 98.4 F (36.9 C)  Resp: 18    Intake/Output Summary (Last 24 hours) at 01/31/14 1634 Last data filed at 01/30/14 1718  Gross per 24 hour  Intake      0 ml  Output    250 ml  Net   -250 ml   Filed Weights   01/30/14 1839  Weight: 46.3 kg (102 lb 1.2 oz)    Exam:   General:  Patient is awake and alert, was oriented to person and place  Cardiovascular: regular rate and rhythm, normal S1S2  Respiratory: Clear to auscultation bilaterally  Abdomen: Soft, nontender nondistended  Musculoskeletal: no edema  Neurological: She had a nonfocal exam, 5/5 muscle strength to bilateral extremities.   Data Reviewed: Basic Metabolic Panel:  Recent Labs Lab 01/30/14 1420  NA 135*  K 4.0  CL 94*  CO2 27  GLUCOSE 95  BUN 21  CREATININE 0.79  CALCIUM 9.4   Liver Function Tests:  Recent Labs Lab 01/30/14 1420  AST 27  ALT 15  ALKPHOS 56  BILITOT 1.0  PROT 7.2  ALBUMIN 3.9    Recent Labs Lab 01/30/14 1420  LIPASE 66*   No results found for this basename: AMMONIA,  in the last 168 hours CBC:  Recent Labs Lab 01/30/14 1225  WBC 6.9  NEUTROABS 5.1  HGB 14.3  HCT 41.3  MCV 90.6  PLT 247   Cardiac Enzymes: No results found for this basename: CKTOTAL, CKMB, CKMBINDEX, TROPONINI,  in the last 168 hours BNP (last 3 results) No results found for this basename: PROBNP,  in the last 8760 hours CBG: No results found for this basename: GLUCAP,  in the last 168 hours  No results found for  this or any previous visit (from the past 240 hour(s)).   Studies: Dg Chest 2 View  01/31/2014   CLINICAL DATA:  Cardiac palpitations. No chest pain. History of breast cancer.  EXAM: CHEST  2 VIEW  COMPARISON:  01/30/2014.  FINDINGS: Emphysema is present. Chronic pulmonary parenchymal scarring. Surgical clips over the RIGHT breast. No airspace disease. The patient is cachectic. Aortic arch atherosclerosis. Chronic bilateral pleural  apical scarring appears similar. LEFT axillary dissection clips. Surgical clips over the LEFT breast also noted.  No pleural effusion.  IMPRESSION: Emphysema and pulmonary parenchymal scarring without acute cardiopulmonary disease.   Electronically Signed   By: Dereck Ligas M.D.   On: 01/31/2014 14:55   Dg Chest 2 View  01/30/2014   CLINICAL DATA:  Altered mental status and weakness. History of breast carcinoma  EXAM: CHEST  2 VIEW  COMPARISON:  Chest radiograph December 10, 2009 and chest CT June 01, 2011  FINDINGS: There is underlying emphysematous change. There are multiple areas of scarring and fibrotic change in both upper lobes, essentially stable. There is no frank edema or consolidation. Heart size is normal. Pulmonary vascularity is consistent with the underlying emphysematous change. There are multiple surgical clips on the left, stable. There is no adenopathy. There is atherosclerotic change in the aorta. There are no appreciable bone lesions.  IMPRESSION: Underlying emphysema. Scarring with areas of fibrotic type change in both upper lobes. No frank edema or consolidation. No change in cardiac silhouette.   Electronically Signed   By: Lowella Grip M.D.   On: 01/30/2014 14:13   Ct Head Wo Contrast  01/30/2014   CLINICAL DATA:  Altered mental status/confusion, progressed recently  EXAM: CT HEAD WITHOUT CONTRAST  TECHNIQUE: Contiguous axial images were obtained from the base of the skull through the vertex without intravenous contrast.  COMPARISON:  None.  FINDINGS: There is mild diffuse atrophy. There is no appreciable mass, hemorrhage, extra-axial fluid collection, or midline shift. There is patchy small vessel disease in the centra semiovale bilaterally. There is a small focus of decreased attenuation in the posterior limb of the left extreme capsule which may represent a small acute infarct. This finding is seen on slice 13 series 812. No other findings concerning for potential acute  infarct present. Bony calvarium appears intact. Mastoid air cells are clear. There is opacification of a mid left ethmoid air cell. There are vertebral artery calcifications bilaterally.  IMPRESSION: Atrophy with periventricular small vessel disease. Suspect small acute infarct in the posterior limb of the left extreme capsule. No hemorrhage or mass effect. Left ethmoid sinus disease. Bilateral vertebral artery calcification.   Electronically Signed   By: Lowella Grip M.D.   On: 01/30/2014 13:57   Mr Brain Wo Contrast  01/31/2014   CLINICAL DATA:  Altered mental status. Dementia. Word-finding difficulty. History of hypertension, breast cancer, and shortness of breath. Increased confusion in the setting of elevated blood pressure.  EXAM: MRI HEAD WITHOUT CONTRAST  MRA HEAD WITHOUT CONTRAST  TECHNIQUE: Multiplanar, multiecho pulse sequences of the brain and surrounding structures were obtained without intravenous contrast. Angiographic images of the head were obtained using MRA technique without contrast.  COMPARISON:  CT head 01/30/2014.  FINDINGS: MRI HEAD FINDINGS  No evidence for acute infarction, hemorrhage, mass lesion, hydrocephalus, or extra-axial fluid. Moderate cerebral and cerebellar atrophy. T2 and FLAIR hyperintensities throughout the periventricular and subcortical white matter also affecting the brainstem consistent with moderately advanced small vessel disease. No posterior circulation cortical edema to suggest  PRES. Flow voids are maintained. No areas of intracranial hemorrhage are evident. Unremarkable midline structures. No carotid or basilar stenosis. Extracranial soft tissues unremarkable. No osseous lesions.  Compared with prior CT, the area questioned of acute infarction represents a chronic lacune(s).  MRA HEAD FINDINGS  The internal carotid arteries and basilar artery are widely patent. Both vertebrals contribute to formation of the basilar. There is no flow reducing stenosis of the  anterior, or middle cerebral arteries. Moderately severe distal P1 RIGHT PCA stenosis estimated 75%. Similar 75% stenosis LEFT P2 ambient segment. No cerebellar branch occlusion.  IMPRESSION: Chronic changes as described. No acute stroke or evidence for hypertensive encephalopathy. No intracranial hemorrhage is evident.  BILATERAL PCA stenoses without visible chronic or acute posterior circulation infarct.   Electronically Signed   By: Rolla Flatten M.D.   On: 01/31/2014 13:13   Mr Jodene Nam Head/brain Wo Cm  01/31/2014   CLINICAL DATA:  Altered mental status. Dementia. Word-finding difficulty. History of hypertension, breast cancer, and shortness of breath. Increased confusion in the setting of elevated blood pressure.  EXAM: MRI HEAD WITHOUT CONTRAST  MRA HEAD WITHOUT CONTRAST  TECHNIQUE: Multiplanar, multiecho pulse sequences of the brain and surrounding structures were obtained without intravenous contrast. Angiographic images of the head were obtained using MRA technique without contrast.  COMPARISON:  CT head 01/30/2014.  FINDINGS: MRI HEAD FINDINGS  No evidence for acute infarction, hemorrhage, mass lesion, hydrocephalus, or extra-axial fluid. Moderate cerebral and cerebellar atrophy. T2 and FLAIR hyperintensities throughout the periventricular and subcortical white matter also affecting the brainstem consistent with moderately advanced small vessel disease. No posterior circulation cortical edema to suggest PRES. Flow voids are maintained. No areas of intracranial hemorrhage are evident. Unremarkable midline structures. No carotid or basilar stenosis. Extracranial soft tissues unremarkable. No osseous lesions.  Compared with prior CT, the area questioned of acute infarction represents a chronic lacune(s).  MRA HEAD FINDINGS  The internal carotid arteries and basilar artery are widely patent. Both vertebrals contribute to formation of the basilar. There is no flow reducing stenosis of the anterior, or middle  cerebral arteries. Moderately severe distal P1 RIGHT PCA stenosis estimated 75%. Similar 75% stenosis LEFT P2 ambient segment. No cerebellar branch occlusion.  IMPRESSION: Chronic changes as described. No acute stroke or evidence for hypertensive encephalopathy. No intracranial hemorrhage is evident.  BILATERAL PCA stenoses without visible chronic or acute posterior circulation infarct.   Electronically Signed   By: Rolla Flatten M.D.   On: 01/31/2014 13:13    Scheduled Meds: .  stroke: mapping our early stages of recovery book   Does not apply Once  . aspirin  325 mg Oral Daily  . enoxaparin (LOVENOX) injection  40 mg Subcutaneous Q24H   Continuous Infusions: . sodium chloride 1,000 mL (01/31/14 0339)    Active Problems:   Acute CVA (cerebrovascular accident)   Altered mental status    Time spent: 30 min    Kelvin Cellar  [facility] Pager [phone]. If 7PM-7AM, please contact night-coverage at [email or web address], password [facility] 01/31/2014, 4:34 PM  LOS: 1 day

## 2014-01-31 NOTE — Evaluation (Signed)
Speech Language Pathology Evaluation Patient Details Name: Rachael Perez MRN: [id number] DOB: 1927/03/20 Today's Date: 01/31/2014 Time:  -     Problem List:  Patient Active Problem List   Diagnosis Date Noted  . Acute CVA (cerebrovascular accident) 01/30/2014  . Altered mental status 01/30/2014  . Insomnia secondary to anxiety 07/27/2013  . Dry mouth 07/27/2013  . Loss of weight 07/27/2013  . Essential hypertension, benign 11/09/2012  . Senile osteoporosis   . Vitamin D deficiency   . Dementia in conditions classified elsewhere without behavioral disturbance(294.10)   . Insomnia, unspecified   . 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 History of breast cancer in female 07/31/2011  . Atypical chest pain 06/19/2011  . Elevated blood pressure 12/08/2010  . Abnormal chest CT 12/08/2010  . Chronic anxiety 12/08/2010   Past Medical History:  Past Medical History  Diagnosis Date  . Hypertension   . Palpitations   . SOB (shortness of breath) on exertion   . History of medication noncompliance   . Varicose veins   . Wears hearing aid   . Abnormal chest CT     Lesion is unchanged; Followed by Dr. Benay Spice  . Senile osteoporosis   . Anxiety   . Breast cancer 08/1994    Remote lumpectomy on L  . Breast cancer 05/2011    R breast with lumpectomy and XRT therapy  . Arthritis   . Vitamin D deficiency   . Other B-complex deficiencies   . Dementia in conditions classified elsewhere without behavioral disturbance   . Malignant neoplasm of breast (female), unspecified site   . Chronic pain   . Varicose veins of lower extremities with inflammation   . Hallucinations   . Insomnia, unspecified    Past Surgical History:  Past Surgical History  Procedure Laterality Date  . Tonsillectomy    . Tubal ligation  1980  . Breast lumpectomy  09/01/2011  . Cataract extraction, bilateral    . US echocardiography  12/17/2009    EF 55-60%  . Breast lumpectomy  1996     left breast  . Varicose vein removal  1970   HPI:   80 y.o. woman pmh most significant for HTN, dementia, and anxiety p/w AMS as witnessed by her fellow residents and staff with possibility of acute stroke   Assessment / Plan / Recommendation Clinical Impression  Pt demonstrates cognitive impairment with anxiety, decreased selective attention with frequent need for redirection with verbal and functional tasks. Recent memory is impaired with some confabulation noted with significantly impaired safety awareness. SLP will continue to follow for cognitive recovery.     SLP Assessment  Patient needs continued Speech Lanaguage Pathology Services    Follow Up Recommendations  24 hour supervision/assistance    Frequency and Duration min 2x/week  2 weeks   Pertinent Vitals/Pain Pain Assessment: No/denies pain   SLP Goals  Potential to Achieve Goals: Good  SLP Evaluation Prior Functioning  Cognitive/Linguistic Baseline: Information not available Type of Home: Assisted living Vocation: Retired   Associate Professor  Overall Cognitive Status: Impaired/Different from baseline Arousal/Alertness: Awake/alert Orientation Level: Oriented to person;Oriented to place;Disoriented to time;Disoriented to situation Attention: Focused;Sustained;Selective Focused Attention: Appears intact Sustained Attention: Impaired Sustained Attention Impairment: Verbal complex;Functional complex Selective Attention: Impaired Selective Attention Impairment: Verbal basic;Functional basic Memory: Impaired Memory Impairment: Decreased recall of new information;Decreased short term memory;Prospective memory Decreased Short Term Memory: Verbal basic;Functional basic Awareness: Impaired Awareness Impairment: Intellectual impairment;Emergent impairment Problem Solving: Impaired Problem Solving  Impairment: Verbal complex;Functional complex Executive Function: Self Monitoring;Self Correcting Self Monitoring:  Impaired Safety/Judgment: Impaired    Comprehension  Auditory Comprehension Overall Auditory Comprehension: Appears within functional limits for tasks assessed Interfering Components: Attention;Anxiety    Expression Verbal Expression Overall Verbal Expression: Appears within functional limits for tasks assessed   Oral / Motor Oral Motor/Sensory Function Overall Oral Motor/Sensory Function: Appears within functional limits for tasks assessed Motor Speech Overall Motor Speech: Appears within functional limits for tasks assessed   GO    [name], MA CCC-SLP [phone]  Lynann Beaver 01/31/2014, 11:02 AM

## 2014-01-31 NOTE — Progress Notes (Signed)
VASCULAR LAB PRELIMINARY  PRELIMINARY  PRELIMINARY  PRELIMINARY  Carotid duplex completed.    Preliminary report: Moderate plaque noted throughout.  No significant ICA stenosis.  Rachael Perez, RVT 01/31/2014, 2:54 PM

## 2014-01-31 NOTE — Progress Notes (Signed)
Rehab Admissions Coordinator Note:  Patient was screened by Rachael Perez for appropriateness for an Inpatient Acute Rehab Consult.  At this time, we are recommending Inpatient Rehab consult.  [name] 01/31/2014, 11:55 AM  I can be reached at [phone].
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
## 2014-01-31 NOTE — Evaluation (Signed)
Occupational Therapy Evaluation Patient Details Name: Rachael Perez MRN: [id number] DOB: 1926-06-02 Today's Date: 01/31/2014    History of Present Illness Rachael Perez is a 80 y.o. female with a history of hypertension, breast cancer, dementia that presented to the emergency department from assisted living community for confusion.  Patient has an [place] accent, and normally has pressured speech. In ED, patient states she "feels fine" and would like something to eat though was hypertensive with Rapid Response called. In the emergency room, CT of the head was conducted showing possible small posterior limb infarct.   Clinical Impression   PT admitted with workup underway to r/o CVA. Pt currently with functional limitiations due to the deficits listed below (see OT problem list). Pt at baseline living at ALF with assistance for food prep and room cleaning. Pt ambulating and dressing without (A).  Pt will benefit from skilled OT to increase their independence and safety with adls and balance to allow discharge CIR. ot to follow acutely for adl retraining and dynamic balance.     Follow Up Recommendations  CIR;Supervision/Assistance - 24 hour    Equipment Recommendations  Other (comment) (defer to SNF)    Recommendations for Other Services Rehab consult     Precautions / Restrictions Precautions Precautions: Fall      Mobility Bed Mobility Overal bed mobility:  (in chair on arrival)                Transfers Overall transfer level: Needs assistance Equipment used: 1 person hand held assist Transfers: Sit to/from Stand Sit to Stand: Min assist         General transfer comment: Pt needed cues for hand placement. Pt with chair alarm sounding and scooting off the end of chair. pt neeeded cues to scoot back to chair base to allow for elevated leg rest to be closed.     Balance Overall balance assessment: Needs assistance Sitting-balance support: No upper  extremity supported;Feet supported Sitting balance-Leahy Scale: Fair   Postural control: Posterior lean Standing balance support: During functional activity;Single extremity supported Standing balance-Leahy Scale: Poor Standing balance comment: posterior lean with adls, Pt with posterior lean with sit<>Stand at eob static standing             High level balance activites: Direction changes;Head turns High Level Balance Comments: requires incr (A) and LOB            ADL Overall ADL's : Needs assistance/impaired     Grooming: Wash/dry face;Oral care;Brushing hair;Moderate assistance;Standing Grooming Details (indicate cue type and reason): posterior lean, cues for sequencing and attention to task                 Toilet Transfer: Minimal assistance;Ambulation           Functional mobility during ADLs: Moderate assistance General ADL Comments: pt with LOB with head turns. pt very easily distracted. pt very impulsive and attempting to exit the chair and alarm sounding. Pt answering call bell due to chair alarm sounding. pt with no awareness to chair alarm vs phone ringing. Pt needed max cues to sequence ADLS     Vision                     Perception Perception Perception Tested?: No   Praxis Praxis Praxis tested?: Deficits Deficits: Perseveration    Pertinent Vitals/Pain Pain Assessment: Faces Faces Pain Scale: Hurts whole lot Pain Location: L ankle pain Pain Descriptors / Indicators:  (weight bearing  and with tactile input) Pain Intervention(s): Repositioned     Hand Dominance Right   Extremity/Trunk Assessment Upper Extremity Assessment Upper Extremity Assessment: Overall WFL for tasks assessed   Lower Extremity Assessment Lower Extremity Assessment: Overall WFL for tasks assessed   Cervical / Trunk Assessment Cervical / Trunk Assessment: Normal   Communication Communication Communication: No difficulties   Cognition Arousal/Alertness:  Awake/alert Behavior During Therapy: Impulsive Overall Cognitive Status: Impaired/Different from baseline Area of Impairment: Orientation;Attention;Memory;Following commands;Safety/judgement;Awareness;Problem solving Orientation Level: Disoriented to;Situation;Time (reports "hospital") Current Attention Level: Sustained Memory: Decreased recall of precautions;Decreased short-term memory Following Commands: Follows multi-step commands inconsistently;Follows multi-step commands with increased time Safety/Judgement: Decreased awareness of safety;Decreased awareness of deficits Awareness: Intellectual Problem Solving: Slow processing;Decreased initiation;Difficulty sequencing General Comments: Pt demonstrates decr awareness to balance deficits and unable to report reason for admission. Pt reports "I will always remember this place it has no food" Pt perseverating on need for food and combing hair. Pt unable to follow 2 step commands and delayed responses to all questions   General Comments       Exercises       Shoulder Instructions      Home Living Family/patient expects to be discharged to:: Assisted living     Type of Home: Assisted living                           Additional Comments: From [facility]. loves participating in all the activities offered. 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 is a Veterinary surgeon in [place]      Prior Functioning/Environment Level of Independence: Independent        Comments: Pt reports "being with the girls" at ALF    OT Diagnosis: Generalized weakness;Cognitive deficits   OT Problem List: Decreased strength;Decreased activity tolerance;Impaired balance (sitting and/or standing);Decreased cognition;Decreased safety awareness;Decreased knowledge of use of DME or AE;Pain   OT Treatment/Interventions: Self-care/ADL training;Therapeutic activities;Cognitive  remediation/compensation;Patient/family education;Balance training;Therapeutic exercise    OT Goals(Current goals can be found in the care plan section) Acute Rehab OT Goals Patient Stated Goal: "to get some food" OT Goal Formulation: Patient unable to participate in goal setting Time For Goal Achievement: 02/14/14 Potential to Achieve Goals: Good  OT Frequency: Min 2X/week   Barriers to D/C:    question ALF (A) level for d/c planning       Co-evaluation              End of Session Equipment Utilized During Treatment: Gait belt Nurse Communication: Mobility status;Precautions  Activity Tolerance: Patient tolerated treatment well Patient left: in chair;with call bell/phone within reach;with chair alarm set;Other (comment) (SLP Horris Latino present)   Time: 5366-4403 OT Time Calculation (min): 27 min Charges:  OT General Charges $OT Visit: 1 Procedure OT Evaluation $Initial OT Evaluation Tier I: 1 Procedure OT Treatments $Self Care/Home Management : 23-37 mins (additional time in the room due to chair alarm) G-Codes:    Parke Poisson B 02-04-2014, 11:46 AM Pager: [phone]

## 2014-01-31 NOTE — Progress Notes (Signed)
Subjective: Patient has no complaints at this time.  She is up with OT washing her face and very intense on brushing her hair.   Objective: Current vital signs: BP 164/70  Pulse 96  Temp(Src) 98.7 F (37.1 C) (Oral)  Resp 29  Ht 5' 7.32" (1.71 m)  Wt 46.3 kg (102 lb 1.2 oz)  BMI 15.83 kg/m2  SpO2 96% Vital signs in last 24 hours: Temp:  [97.5 F (36.4 C)-98.7 F (37.1 C)] 98.7 F (37.1 C) (10/14 0913) Pulse Rate:  [65-108] 96 (10/14 0913) Resp:  [14-29] 29 (10/14 0913) BP: (126-233)/(57-100) 164/70 mmHg (10/14 0913) SpO2:  [95 %-99 %] 96 % (10/14 0913) Weight:  [46.3 kg (102 lb 1.2 oz)] 46.3 kg (102 lb 1.2 oz) (10/13 1839)  Intake/Output from previous day: 10/13 0701 - 10/14 0700 In: -  Out: 250 [Urine:250] Intake/Output this shift:   Nutritional status: NPO  Neurologic Exam: General: NAD Mental Status: Alert, oriented, thought content appropriate.  Speech fluent without evidence of aphasia.  Able to follow 3 step commands without difficulty. Cranial Nerves: II: Visual fields grossly normal, pupils equal, round, reactive to light and accommodation III,IV, VI: ptosis not present, extra-ocular motions intact bilaterally V,VII: smile symmetric, facial light touch sensation normal bilaterally VIII: hearing normal bilaterally IX,X: gag reflex present XI: bilateral shoulder shrug XII: midline tongue extension without atrophy or fasciculations  Motor: Right : Upper extremity   5/5    Left:     Upper extremity   5/5  Lower extremity   5/5     Lower extremity   5/5 Tone and bulk:normal tone throughout; no atrophy noted Sensory: Pinprick and light touch intact throughout, bilaterally Deep Tendon Reflexes:  Right: Upper Extremity   Left: Upper extremity   biceps (C-5 to C-6) 2/4   biceps (C-5 to C-6) 2/4 tricep (C7) 2/4    triceps (C7) 2/4 Brachioradialis (C6) 2/4  Brachioradialis (C6) 2/4  Lower Extremity Lower Extremity  quadriceps (L-2 to L-4) 2/4   quadriceps (L-2  to L-4) 2/4 Achilles (S1) 2/4   Achilles (S1) 2/4  Plantars: Right: downgoing   Left: downgoing Cerebellar: normal finger-to-nose,  normal heel-to-shin test Gait: needs one person assist due to being off balance.  CV: pulses palpable throughout    Filed Vitals:   01/31/14 0130 01/31/14 0330 01/31/14 0500 01/31/14 0913  BP: 147/70 164/68 147/70 164/70  Pulse: 102 91 102 96  Temp: 98.1 F (36.7 C) 97.9 F (36.6 C) 98.1 F (36.7 C) 98.7 F (37.1 C)  TempSrc: Oral Oral Oral Oral  Resp: 20 18 18 29   Height:      Weight:      SpO2: 96% 95% 96% 96%   Lab Results: Basic Metabolic Panel:  Recent Labs Lab 01/30/14 1420  NA 135*  K 4.0  CL 94*  CO2 27  GLUCOSE 95  BUN 21  CREATININE 0.79  CALCIUM 9.4    Liver Function Tests:  Recent Labs Lab 01/30/14 1420  AST 27  ALT 15  ALKPHOS 56  BILITOT 1.0  PROT 7.2  ALBUMIN 3.9    Recent Labs Lab 01/30/14 1420  LIPASE 66*   No results found for this basename: AMMONIA,  in the last 168 hours  CBC:  Recent Labs Lab 01/30/14 1225  WBC 6.9  NEUTROABS 5.1  HGB 14.3  HCT 41.3  MCV 90.6  PLT 247    Cardiac Enzymes: No results found for this basename: CKTOTAL, CKMB, CKMBINDEX, TROPONINI,  in the  last 168 hours  Lipid Panel:  Recent Labs Lab 01/31/14 0605  CHOL 182  TRIG 70  HDL 71  CHOLHDL 2.6  VLDL 14  LDLCALC 97    CBG: No results found for this basename: GLUCAP,  in the last 168 hours  Microbiology: No results found for this or any previous visit.  Coagulation Studies: No results found for this basename: LABPROT, INR,  in the last 72 hours  Imaging: Dg Chest 2 View  01/30/2014   CLINICAL DATA:  Altered mental status and weakness. History of breast carcinoma  EXAM: CHEST  2 VIEW  COMPARISON:  Chest radiograph December 10, 2009 and chest CT June 01, 2011  FINDINGS: There is underlying emphysematous change. There are multiple areas of scarring and fibrotic change in both upper lobes,  essentially stable. There is no frank edema or consolidation. Heart size is normal. Pulmonary vascularity is consistent with the underlying emphysematous change. There are multiple surgical clips on the left, stable. There is no adenopathy. There is atherosclerotic change in the aorta. There are no appreciable bone lesions.  IMPRESSION: Underlying emphysema. Scarring with areas of fibrotic type change in both upper lobes. No frank edema or consolidation. No change in cardiac silhouette.   Electronically Signed   By: Lowella Grip M.D.   On: 01/30/2014 14:13   Ct Head Wo Contrast  01/30/2014   CLINICAL DATA:  Altered mental status/confusion, progressed recently  EXAM: CT HEAD WITHOUT CONTRAST  TECHNIQUE: Contiguous axial images were obtained from the base of the skull through the vertex without intravenous contrast.  COMPARISON:  None.  FINDINGS: There is mild diffuse atrophy. There is no appreciable mass, hemorrhage, extra-axial fluid collection, or midline shift. There is patchy small vessel disease in the centra semiovale bilaterally. There is a small focus of decreased attenuation in the posterior limb of the left extreme capsule which may represent a small acute infarct. This finding is seen on slice 13 series 294. No other findings concerning for potential acute infarct present. Bony calvarium appears intact. Mastoid air cells are clear. There is opacification of a mid left ethmoid air cell. There are vertebral artery calcifications bilaterally.  IMPRESSION: Atrophy with periventricular small vessel disease. Suspect small acute infarct in the posterior limb of the left extreme capsule. No hemorrhage or mass effect. Left ethmoid sinus disease. Bilateral vertebral artery calcification.   Electronically Signed   By: Lowella Grip M.D.   On: 01/30/2014 13:57    Medications:  Scheduled: .  stroke: mapping our early stages of recovery book   Does not apply Once  . aspirin  325 mg Oral Daily  .  enoxaparin (LOVENOX) injection  40 mg Subcutaneous Q24H    Assessment/Plan:  80 yo with increased confusion in setting of elevated BP.  Recent BP continues to be elevated but more controlled 140-160/70. Currently having no word substitution.  Stroke work up pending.    Etta Quill PA-C [facility] [phone]  01/31/2014, 10:12 AM

## 2014-01-31 NOTE — Progress Notes (Signed)
Received pt from ED per stretcher. A and Oriented to self only. Very anxious and BP elevated (as recorded). No orders for antihypertensive med. Called and notified Dr Ree Kida and new order placed. Family not available at the moment.

## 2014-01-31 NOTE — Progress Notes (Signed)
  Echocardiogram 2D Echocardiogram has been performed.  Rachael Perez 01/31/2014, 2:07 PM

## 2014-02-01 MED ORDER — SENNOSIDES-DOCUSATE SODIUM 8.6-50 MG PO TABS: 1.0000 | ORAL_TABLET | Freq: Every evening | ORAL | Status: DC | PRN

## 2014-02-01 MED ORDER — DIPHENHYDRAMINE HCL 12.5 MG/5ML PO ELIX
12.5000 mg | ORAL_SOLUTION | Freq: Once | ORAL | Status: AC
  Administered 2014-02-01: 12.5 mg via ORAL
  Filled 2014-02-01: qty 10

## 2014-02-01 MED ORDER — AMLODIPINE BESYLATE 10 MG PO TABS
10.0000 mg | ORAL_TABLET | Freq: Every day | ORAL | Status: DC
  Administered 2014-02-02: 10 mg via ORAL
  Filled 2014-02-01: qty 1

## 2014-02-01 MED ORDER — ASPIRIN 325 MG PO TABS: 325.0000 mg | ORAL_TABLET | Freq: Every day | ORAL | Status: AC

## 2014-02-01 MED ORDER — CEFUROXIME AXETIL 250 MG PO TABS: 250.0000 mg | ORAL_TABLET | Freq: Two times a day (BID) | ORAL | Status: DC

## 2014-02-01 MED ORDER — AMLODIPINE BESYLATE 10 MG PO TABS: 10.0000 mg | ORAL_TABLET | Freq: Every day | ORAL | Status: DC

## 2014-02-01 NOTE — Progress Notes (Signed)
UR complete.  Kady Toothaker RN, MSN 

## 2014-02-01 NOTE — Progress Notes (Signed)
Subjective: Feels at her baseline.   Exam: Filed Vitals:   02/01/14 1030  BP: 149/53  Pulse: 78  Temp: 97.3 F (36.3 C)  Resp: 20   Gen: In bed, NAD MS: Sheis able to tell me the place, year today, though recently oriented by nursing.  CN: PERRL, EOMI, VFF Motor: 5/5 thorughout Sensory:intact to LT  Impression: 80 yo F with AMS in the setting of dementia. Possible UTI on admission vs manifestation of dementia.   Recommendations: 1) would strongly counsel family to supervise medications/finances.  2) may benefitt from neurology follow up as outpatient for consideration of acetylcholinesterase inhibitors.  3) No further recommendations at this time. Please call with any further questions or concerns.   Roland Rack, MD [facility] [phone]  If 7pm- 7am, please page neurology on call as listed in [place].

## 2014-02-01 NOTE — Progress Notes (Signed)
[facility] PROGRESS NOTE  Rachael Perez WIO:[id number] DOB: December 23, 1926 DOA: 01/30/2014 PCP: Hollace Kinnier, DO  Assessment/Plan: 1. Acute encephalopathy -Possibilities include TIA, hypertensive encephalopathy, underlying infectious process. -MRI of the brain did not show evidence of acute CVA -Chest x-ray did not show evidence of pneumonia however urinalysis did show the presence of leukocytes and a few bacteria.  -Started on  Antimicrobial therapy with ceftin  2.  Possible urinary tract infection -Patient with history of dementia presenting with acute encephalopathy -Urinalysis showing the presence of a few bacteria and leukocytes. -Given recent decline will treat empirically with Ceftin  3.  Hypertension. -Patient's blood pressures improving, with last blood pressure reading of 151/60 -Initially had systolic blood pressure of 214 -Started Norvasc, will increase to 10 mg PO q daily  4. Dementia -Spoke with patient's son, patient having progression of dementia over the past year. Had a steep functional decline after relocating to a new area. Will need assistance with medication management  -Currently resides at assisted living facility   Code Status: Full Code Family Communication: I spoke with her son over telephone  Disposition Plan: SW consulted for discharge to ALF, transition will likely take place in AM    Consultants:  Neurology   Antibiotics:  Ceftin  HPI/Subjective: Patient is a pleasant 80 year old female with a past medical history of hypertension, cognitive impairment, who was admitted to the medicine service on 01/30/2014. She was found to have mental status changes at her assisted-living facility for which she was transferred to the emergency department at [facility] for further evaluation. Patient was seen and evaluated by neurology, as she underwent stroke workup. An MRI of the brain did not reveal acute stroke.   Objective: Filed  Vitals:   02/01/14 1030  BP: 149/53  Pulse: 78  Temp: 97.3 F (36.3 C)  Resp: 20    Intake/Output Summary (Last 24 hours) at 02/01/14 1743 Last data filed at 02/01/14 1151  Gross per 24 hour  Intake    360 ml  Output      0 ml  Net    360 ml   Filed Weights   01/30/14 1839  Weight: 46.3 kg (102 lb 1.2 oz)    Exam:   General:  Patient is awake and alert, was oriented to person and place  Cardiovascular: regular rate and rhythm, normal S1S2  Respiratory: Clear to auscultation bilaterally  Abdomen: Soft, nontender nondistended  Musculoskeletal: no edema  Neurological: She had a nonfocal exam, 5/5 muscle strength to bilateral extremities.   Data Reviewed: Basic Metabolic Panel:  Recent Labs Lab 01/30/14 1420  NA 135*  K 4.0  CL 94*  CO2 27  GLUCOSE 95  BUN 21  CREATININE 0.79  CALCIUM 9.4   Liver Function Tests:  Recent Labs Lab 01/30/14 1420  AST 27  ALT 15  ALKPHOS 56  BILITOT 1.0  PROT 7.2  ALBUMIN 3.9    Recent Labs Lab 01/30/14 1420  LIPASE 66*   No results found for this basename: AMMONIA,  in the last 168 hours CBC:  Recent Labs Lab 01/30/14 1225  WBC 6.9  NEUTROABS 5.1  HGB 14.3  HCT 41.3  MCV 90.6  PLT 247   Cardiac Enzymes: No results found for this basename: CKTOTAL, CKMB, CKMBINDEX, TROPONINI,  in the last 168 hours BNP (last 3 results) No results found for this basename: PROBNP,  in the last 8760 hours CBG: No results found for this basename: GLUCAP,  in the last  168 hours  No results found for this or any previous visit (from the past 240 hour(s)).   Studies: Dg Chest 2 View  01/31/2014   CLINICAL DATA:  Cardiac palpitations. No chest pain. History of breast cancer.  EXAM: CHEST  2 VIEW  COMPARISON:  01/30/2014.  FINDINGS: Emphysema is present. Chronic pulmonary parenchymal scarring. Surgical clips over the RIGHT breast. No airspace disease. The patient is cachectic. Aortic arch atherosclerosis. Chronic bilateral  pleural apical scarring appears similar. LEFT axillary dissection clips. Surgical clips over the LEFT breast also noted.  No pleural effusion.  IMPRESSION: Emphysema and pulmonary parenchymal scarring without acute cardiopulmonary disease.   Electronically Signed   By: Dereck Ligas M.D.   On: 01/31/2014 14:55   Mr Brain Wo Contrast  01/31/2014   CLINICAL DATA:  Altered mental status. Dementia. Word-finding difficulty. History of hypertension, breast cancer, and shortness of breath. Increased confusion in the setting of elevated blood pressure.  EXAM: MRI HEAD WITHOUT CONTRAST  MRA HEAD WITHOUT CONTRAST  TECHNIQUE: Multiplanar, multiecho pulse sequences of the brain and surrounding structures were obtained without intravenous contrast. Angiographic images of the head were obtained using MRA technique without contrast.  COMPARISON:  CT head 01/30/2014.  FINDINGS: MRI HEAD FINDINGS  No evidence for acute infarction, hemorrhage, mass lesion, hydrocephalus, or extra-axial fluid. Moderate cerebral and cerebellar atrophy. T2 and FLAIR hyperintensities throughout the periventricular and subcortical white matter also affecting the brainstem consistent with moderately advanced small vessel disease. No posterior circulation cortical edema to suggest PRES. Flow voids are maintained. No areas of intracranial hemorrhage are evident. Unremarkable midline structures. No carotid or basilar stenosis. Extracranial soft tissues unremarkable. No osseous lesions.  Compared with prior CT, the area questioned of acute infarction represents a chronic lacune(s).  MRA HEAD FINDINGS  The internal carotid arteries and basilar artery are widely patent. Both vertebrals contribute to formation of the basilar. There is no flow reducing stenosis of the anterior, or middle cerebral arteries. Moderately severe distal P1 RIGHT PCA stenosis estimated 75%. Similar 75% stenosis LEFT P2 ambient segment. No cerebellar branch occlusion.  IMPRESSION:  Chronic changes as described. No acute stroke or evidence for hypertensive encephalopathy. No intracranial hemorrhage is evident.  BILATERAL PCA stenoses without visible chronic or acute posterior circulation infarct.   Electronically Signed   By: Rolla Flatten M.D.   On: 01/31/2014 13:13   Mr Jodene Nam Head/brain Wo Cm  01/31/2014   CLINICAL DATA:  Altered mental status. Dementia. Word-finding difficulty. History of hypertension, breast cancer, and shortness of breath. Increased confusion in the setting of elevated blood pressure.  EXAM: MRI HEAD WITHOUT CONTRAST  MRA HEAD WITHOUT CONTRAST  TECHNIQUE: Multiplanar, multiecho pulse sequences of the brain and surrounding structures were obtained without intravenous contrast. Angiographic images of the head were obtained using MRA technique without contrast.  COMPARISON:  CT head 01/30/2014.  FINDINGS: MRI HEAD FINDINGS  No evidence for acute infarction, hemorrhage, mass lesion, hydrocephalus, or extra-axial fluid. Moderate cerebral and cerebellar atrophy. T2 and FLAIR hyperintensities throughout the periventricular and subcortical white matter also affecting the brainstem consistent with moderately advanced small vessel disease. No posterior circulation cortical edema to suggest PRES. Flow voids are maintained. No areas of intracranial hemorrhage are evident. Unremarkable midline structures. No carotid or basilar stenosis. Extracranial soft tissues unremarkable. No osseous lesions.  Compared with prior CT, the area questioned of acute infarction represents a chronic lacune(s).  MRA HEAD FINDINGS  The internal carotid arteries and basilar artery are widely patent.  Both vertebrals contribute to formation of the basilar. There is no flow reducing stenosis of the anterior, or middle cerebral arteries. Moderately severe distal P1 RIGHT PCA stenosis estimated 75%. Similar 75% stenosis LEFT P2 ambient segment. No cerebellar branch occlusion.  IMPRESSION: Chronic changes as  described. No acute stroke or evidence for hypertensive encephalopathy. No intracranial hemorrhage is evident.  BILATERAL PCA stenoses without visible chronic or acute posterior circulation infarct.   Electronically Signed   By: Rolla Flatten M.D.   On: 01/31/2014 13:13    Scheduled Meds: .  stroke: mapping our early stages of recovery book   Does not apply Once  . [START ON 02/02/2014] amLODipine  10 mg Oral Daily  . aspirin  325 mg Oral Daily  . cefUROXime  250 mg Oral BID WC  . enoxaparin (LOVENOX) injection  40 mg Subcutaneous Q24H   Continuous Infusions:    Principal Problem:   Acute encephalopathy Active Problems:   Hypertensive urgency   UTI (urinary tract infection)   Dementia with behavioral disturbance    Time spent: 30 min    Kelvin Cellar  [facility] Pager [phone]. If 7PM-7AM, please contact night-coverage at [email or web address], password [facility] 02/01/2014, 5:43 PM  LOS: 2 days

## 2014-02-01 NOTE — Discharge Summary (Signed)
Physician Discharge Summary  Rachael Perez BHA:[id number] DOB: 1926/11/03 DOA: 01/30/2014  PCP: Hollace Kinnier, DO  Admit date: 01/30/2014 Discharge date: 02/01/2014  Time spent: 35 minutes  Recommendations for Outpatient Follow-up:  1. Please follow up on her blood pressures, she was discharged on Norvasc 10 mg PO q daily  2. Ceftin anticipated stop date: 02/05/2014 3. SW consulted to discharge her to ALF, given the need for higher level of care.   Discharge Diagnoses:  Principal Problem:   Acute encephalopathy Active Problems:   Hypertensive urgency   UTI (urinary tract infection)   Dementia with behavioral disturbance   Discharge Condition: Stable  Diet recommendation: Heart Healthy  Filed Weights   01/30/14 1839  Weight: 46.3 kg (102 lb 1.2 oz)    History of present illness:  Rachael Perez is a 80 y.o. female  With a history of hypertension, breast cancer, dementia that presented to the emergency department from assisted living community. Patient was sent to emergency department for confusion. Per patient's son, who is at bedside, he stated that he saw her yesterday evening and she was in her normal and usual state of health. Patient has not had any recent complaints of abdominal pain diarrhea or constipation, chest pain, shortness of breath. He states that she is currently at her baseline. Patient does have an [place] accent, and normally has pressured speech. At this time, patient states she "feels fine" and would like something to eat. She currently has no complaints.  In the emergency room, CT of the head was conducted showing possible small posterior limb infarct. [facility] was called for admission.   Hospital Course:  Patient is a pleasant 80 year old female with a past medical history of hypertension, cognitive impairment, who was admitted to the medicine service on 01/30/2014. She was found to have mental status changes at her independent-living facility for  which she was transferred to the emergency department at [facility] for further evaluation. Patient was seen and evaluated by neurology, as she underwent stroke workup. An MRI of the brain did not reveal acute stroke.  1. Acute encephalopathy -Possibilities include TIA, hypertensive encephalopathy, underlying infectious process.  -MRI of the brain did not show evidence of acute CVA  -Chest x-ray did not show evidence of pneumonia however urinalysis did show the presence of leukocytes and a few bacteria.  -She was started on emperic antimicrobial therapy with ceftin to treat possible UTI -Patient functioning at her baseline by 02/01/2014 -Spoke we her son, she has had progressive decline in cognitive status over the past year and recent move may have caused further functional decline. SW consulted to transition to ALF 2. Possible urinary tract infection  -Patient with history of dementia presenting with acute encephalopathy  -Urinalysis showing the presence of a few bacteria and leukocytes.  -Given recent decline she was started on empiric treatment with Ceftin  3. Hypertension.  -Patient's blood pressures improving, with last blood pressure reading of 151/60  -Initially had systolic blood pressure of 214  -Will discharge on Norvasc 10 mg PO q daily  Consultations:  Physical Therapy  Occupational Therapy  Speech  Neurology  Social Services  Discharge Exam: Filed Vitals:   02/01/14 1030  BP: 149/53  Pulse: 78  Temp: 97.3 F (36.3 C)  Resp: 20   General: Patient is awake and alert, was oriented to person and place  Cardiovascular: regular rate and rhythm, normal S1S2  Respiratory: Clear to auscultation bilaterally  Abdomen: Soft, nontender nondistended  Musculoskeletal:  no edema Neurological: She had a nonfocal exam, 5/5 muscle strength to bilateral extremities.     Discharge Instructions You were cared for by a hospitalist during your hospital stay. If you have  any questions about your discharge medications or the care you received while you were in the hospital after you are discharged, you can call the unit and asked to speak with the hospitalist on call if the hospitalist that took care of you is not available. Once you are discharged, your primary care physician will handle any further medical issues. Please note that NO REFILLS for any discharge medications will be authorized once you are discharged, as it is imperative that you return to your primary care physician (or establish a relationship with a primary care physician if you do not have one) for your aftercare needs so that they can reassess your need for medications and monitor your lab values.  Discharge Instructions   Call MD for:  difficulty breathing, headache or visual disturbances    Complete by:  As directed      Call MD for:  extreme fatigue    Complete by:  As directed      Call MD for:  hives    Complete by:  As directed      Call MD for:  persistant dizziness or light-headedness    Complete by:  As directed      Call MD for:  persistant nausea and vomiting    Complete by:  As directed      Call MD for:  redness, tenderness, or signs of infection (pain, swelling, redness, odor or green/yellow discharge around incision site)    Complete by:  As directed      Call MD for:  severe uncontrolled pain    Complete by:  As directed      Call MD for:  temperature >100.4    Complete by:  As directed      Diet - low sodium heart healthy    Complete by:  As directed      Increase activity slowly    Complete by:  As directed           Current Discharge Medication List    START taking these medications   Details  amLODipine (NORVASC) 10 MG tablet Take 1 tablet (10 mg total) by mouth daily. Qty: 30 tablet, Refills: 1    cefUROXime (CEFTIN) 250 MG tablet Take 1 tablet (250 mg total) by mouth 2 (two) times daily with a meal. Qty: 8 tablet, Refills: 0    senna-docusate (SENOKOT-S)  8.6-50 MG per tablet Take 1 tablet by mouth at bedtime as needed for mild constipation or moderate constipation. Qty: 30 tablet, Refills: 1      CONTINUE these medications which have CHANGED   Details  aspirin 325 MG tablet Take 1 tablet (325 mg total) by mouth daily. Qty: 30 tablet, Refills: 1      CONTINUE these medications which have NOT CHANGED   Details  MELATONIN PO Take by mouth at bedtime.       Allergies  Allergen Reactions  . Codeine Shortness Of Breath  . Lodine [Etodolac]    Follow-up Information   Follow up with REED, TIFFANY, DO In 1 week.   Specialty:  Geriatric Medicine   Contact information:   [address] [place] [address] [phone]        The results of significant diagnostics from this hospitalization (including imaging, microbiology, ancillary and laboratory)  are listed below for reference.    Significant Diagnostic Studies: Dg Chest 2 View  01/31/2014   CLINICAL DATA:  Cardiac palpitations. No chest pain. History of breast cancer.  EXAM: CHEST  2 VIEW  COMPARISON:  01/30/2014.  FINDINGS: Emphysema is present. Chronic pulmonary parenchymal scarring. Surgical clips over the RIGHT breast. No airspace disease. The patient is cachectic. Aortic arch atherosclerosis. Chronic bilateral pleural apical scarring appears similar. LEFT axillary dissection clips. Surgical clips over the LEFT breast also noted.  No pleural effusion.  IMPRESSION: Emphysema and pulmonary parenchymal scarring without acute cardiopulmonary disease.   Electronically Signed   By: Dereck Ligas M.D.   On: 01/31/2014 14:55   Dg Chest 2 View  01/30/2014   CLINICAL DATA:  Altered mental status and weakness. History of breast carcinoma  EXAM: CHEST  2 VIEW  COMPARISON:  Chest radiograph December 10, 2009 and chest CT June 01, 2011  FINDINGS: There is underlying emphysematous change. There are multiple areas of scarring and fibrotic change in both upper lobes, essentially stable. There  is no frank edema or consolidation. Heart size is normal. Pulmonary vascularity is consistent with the underlying emphysematous change. There are multiple surgical clips on the left, stable. There is no adenopathy. There is atherosclerotic change in the aorta. There are no appreciable bone lesions.  IMPRESSION: Underlying emphysema. Scarring with areas of fibrotic type change in both upper lobes. No frank edema or consolidation. No change in cardiac silhouette.   Electronically Signed   By: Lowella Grip M.D.   On: 01/30/2014 14:13   Ct Head Wo Contrast  01/30/2014   CLINICAL DATA:  Altered mental status/confusion, progressed recently  EXAM: CT HEAD WITHOUT CONTRAST  TECHNIQUE: Contiguous axial images were obtained from the base of the skull through the vertex without intravenous contrast.  COMPARISON:  None.  FINDINGS: There is mild diffuse atrophy. There is no appreciable mass, hemorrhage, extra-axial fluid collection, or midline shift. There is patchy small vessel disease in the centra semiovale bilaterally. There is a small focus of decreased attenuation in the posterior limb of the left extreme capsule which may represent a small acute infarct. This finding is seen on slice 13 series 694. No other findings concerning for potential acute infarct present. Bony calvarium appears intact. Mastoid air cells are clear. There is opacification of a mid left ethmoid air cell. There are vertebral artery calcifications bilaterally.  IMPRESSION: Atrophy with periventricular small vessel disease. Suspect small acute infarct in the posterior limb of the left extreme capsule. No hemorrhage or mass effect. Left ethmoid sinus disease. Bilateral vertebral artery calcification.   Electronically Signed   By: Lowella Grip M.D.   On: 01/30/2014 13:57   Mr Brain Wo Contrast  01/31/2014   CLINICAL DATA:  Altered mental status. Dementia. Word-finding difficulty. History of hypertension, breast cancer, and shortness of  breath. Increased confusion in the setting of elevated blood pressure.  EXAM: MRI HEAD WITHOUT CONTRAST  MRA HEAD WITHOUT CONTRAST  TECHNIQUE: Multiplanar, multiecho pulse sequences of the brain and surrounding structures were obtained without intravenous contrast. Angiographic images of the head were obtained using MRA technique without contrast.  COMPARISON:  CT head 01/30/2014.  FINDINGS: MRI HEAD FINDINGS  No evidence for acute infarction, hemorrhage, mass lesion, hydrocephalus, or extra-axial fluid. Moderate cerebral and cerebellar atrophy. T2 and FLAIR hyperintensities throughout the periventricular and subcortical white matter also affecting the brainstem consistent with moderately advanced small vessel disease. No posterior circulation cortical edema to suggest PRES. Flow  voids are maintained. No areas of intracranial hemorrhage are evident. Unremarkable midline structures. No carotid or basilar stenosis. Extracranial soft tissues unremarkable. No osseous lesions.  Compared with prior CT, the area questioned of acute infarction represents a chronic lacune(s).  MRA HEAD FINDINGS  The internal carotid arteries and basilar artery are widely patent. Both vertebrals contribute to formation of the basilar. There is no flow reducing stenosis of the anterior, or middle cerebral arteries. Moderately severe distal P1 RIGHT PCA stenosis estimated 75%. Similar 75% stenosis LEFT P2 ambient segment. No cerebellar branch occlusion.  IMPRESSION: Chronic changes as described. No acute stroke or evidence for hypertensive encephalopathy. No intracranial hemorrhage is evident.  BILATERAL PCA stenoses without visible chronic or acute posterior circulation infarct.   Electronically Signed   By: Rolla Flatten M.D.   On: 01/31/2014 13:13   Mr Jodene Nam Head/brain Wo Cm  01/31/2014   CLINICAL DATA:  Altered mental status. Dementia. Word-finding difficulty. History of hypertension, breast cancer, and shortness of breath. Increased  confusion in the setting of elevated blood pressure.  EXAM: MRI HEAD WITHOUT CONTRAST  MRA HEAD WITHOUT CONTRAST  TECHNIQUE: Multiplanar, multiecho pulse sequences of the brain and surrounding structures were obtained without intravenous contrast. Angiographic images of the head were obtained using MRA technique without contrast.  COMPARISON:  CT head 01/30/2014.  FINDINGS: MRI HEAD FINDINGS  No evidence for acute infarction, hemorrhage, mass lesion, hydrocephalus, or extra-axial fluid. Moderate cerebral and cerebellar atrophy. T2 and FLAIR hyperintensities throughout the periventricular and subcortical white matter also affecting the brainstem consistent with moderately advanced small vessel disease. No posterior circulation cortical edema to suggest PRES. Flow voids are maintained. No areas of intracranial hemorrhage are evident. Unremarkable midline structures. No carotid or basilar stenosis. Extracranial soft tissues unremarkable. No osseous lesions.  Compared with prior CT, the area questioned of acute infarction represents a chronic lacune(s).  MRA HEAD FINDINGS  The internal carotid arteries and basilar artery are widely patent. Both vertebrals contribute to formation of the basilar. There is no flow reducing stenosis of the anterior, or middle cerebral arteries. Moderately severe distal P1 RIGHT PCA stenosis estimated 75%. Similar 75% stenosis LEFT P2 ambient segment. No cerebellar branch occlusion.  IMPRESSION: Chronic changes as described. No acute stroke or evidence for hypertensive encephalopathy. No intracranial hemorrhage is evident.  BILATERAL PCA stenoses without visible chronic or acute posterior circulation infarct.   Electronically Signed   By: Rolla Flatten M.D.   On: 01/31/2014 13:13    Microbiology: No results found for this or any previous visit (from the past 240 hour(s)).   Labs: Basic Metabolic Panel:  Recent Labs Lab 01/30/14 1420  NA 135*  K 4.0  CL 94*  CO2 27  GLUCOSE 95   BUN 21  CREATININE 0.79  CALCIUM 9.4   Liver Function Tests:  Recent Labs Lab 01/30/14 1420  AST 27  ALT 15  ALKPHOS 56  BILITOT 1.0  PROT 7.2  ALBUMIN 3.9    Recent Labs Lab 01/30/14 1420  LIPASE 66*   No results found for this basename: AMMONIA,  in the last 168 hours CBC:  Recent Labs Lab 01/30/14 1225  WBC 6.9  NEUTROABS 5.1  HGB 14.3  HCT 41.3  MCV 90.6  PLT 247   Cardiac Enzymes: No results found for this basename: CKTOTAL, CKMB, CKMBINDEX, TROPONINI,  in the last 168 hours BNP: BNP (last 3 results) No results found for this basename: PROBNP,  in the last 8760 hours CBG: No  results found for this basename: GLUCAP,  in the last 168 hours     Signed:  Kelvin Cellar  [facility] 02/01/2014, 12:53 PM

## 2014-02-01 NOTE — Clinical Social Work Psychosocial (Signed)
Clinical Social Work Department BRIEF PSYCHOSOCIAL ASSESSMENT 02/01/2014  Patient:  Rachael Perez, Rachael Perez     Account Number:  [id number]     Admit date:  01/30/2014  Clinical Social Worker:  Glendon Axe, CLINICAL SOCIAL WORKER  Date/Time:  02/01/2014 05:06 PM  Referred by:  Physician  Date Referred:  02/01/2014 Referred for  ALF Placement  SNF Placement   Other Referral:   Interview type:  Other - See comment Other interview type:   CSW spoke with pt's son Floris Neuhaus) via telephone.    PSYCHOSOCIAL DATA Living Status:  FACILITY Admitted from facility:  [facility] Level of care:  Independent Living Primary support name:  Anureet Bruington [phone] Primary support relationship to patient:  CHILD, ADULT Degree of support available:   Strong    CURRENT CONCERNS Current Concerns  Post-Acute Placement   Other Concerns:    SOCIAL WORK ASSESSMENT / PLAN Clinical Social Worker spoke with pt's son at length in reference to post-acute placement. CSW expressed to pt's son the need for pt to be placed in an assisted living community vs independent living. Pt was admitted from ALF, [facility] (independent living). CSW spoke with admissions coordinator at [facility] in reference to pt transitioning to assisted living. [facility] reported no beds available in assisted living and offered pt a "garden room" which is a bridge to assisted living but not totally assisted. Pt's son and family not agreeable with bridge to assisted living and would like pt placed in an ALF. CSW faxed out pt clincials to both ALF's and SNF's for backup. FL-2 placed on chart for MD signature. CSW will update pt's son of bed offers tomorrow morning. CSW to follow pt and provide continued support and facilitate discharge needs.   Assessment/plan status:  Psychosocial Support/Ongoing Assessment of Needs Other assessment/ plan:   Information/referral to community resources:   ALF and SNF information provided to  pt's son.    PATIENT'S/FAMILY'S RESPONSE TO PLAN OF CARE: Pt up walking around unit throughout the day with limited assistance. Pt alert, happy and smiling. Pt's son and family agreeable to bed search and plans to make a decision in the morning. CSW remains available as needed.     Glendon Axe, MSW, LCSWA [phone] 02/01/2014 5:22 PM

## 2014-02-01 NOTE — Progress Notes (Signed)
Pt for discharge to SNF when bed is available. Pt was calm all shift after sitter was discontinue but became very confused and agitated, and did not want to stay in her room . Will need a sitter. Charge nurse made aware. Pt's son is with pt at the moment.

## 2014-02-01 NOTE — Progress Notes (Signed)
Physical Therapy Treatment Patient Details Name: EVALEEN SANT MRN: [id number] DOB: Apr 30, 1926 Today's Date: 02/01/2014    History of Present Illness NAILAH LUEPKE is a 80 y.o. female with a history of hypertension, breast cancer, dementia that presented to the emergency department from assisted living community for confusion.  CT of the head was conducted showing possible small posterior limb infarct. MRI showed chronic changes but no acute findings.    PT Comments    Patient doing much better today. She is eager to return home to her "own things." Pt had some difficulty with path-finding, however this is likely her baseline cognition (her grandson feels so). Pt safe to return to ALF with no further PT needs identified.    Follow Up Recommendations  Supervision/Assistance - 24 hour;No PT follow up (due to decr cognition)     Equipment Recommendations  None recommended by PT    Recommendations for Other Services       Precautions / Restrictions Precautions Precautions: Fall Restrictions Weight Bearing Restrictions: No    Mobility  Bed Mobility               General bed mobility comments: in chair  Transfers Overall transfer level: Needs assistance Equipment used: None Transfers: Sit to/from Stand Sit to Stand: Supervision         General transfer comment: no loss of balance or unsteadiness; supervision only due to impulsive behavior/cognition  Ambulation/Gait Ambulation/Gait assistance: Supervision Ambulation Distance (Feet): 400 Feet Assistive device: None Gait Pattern/deviations: WFL(Within Functional Limits)   Gait velocity interpretation: at or above normal speed for age/gender General Gait Details: maintained straight course even during head turning   Stairs            Wheelchair Mobility    Modified Rankin (Stroke Patients Only) Modified Rankin (Stroke Patients Only) Pre-Morbid Rankin Score: Moderate disability Modified  Rankin: Moderately severe disability (supervision for safety (in familiar environment, may not nee)     Balance             Standing balance-Leahy Scale: Good               High level balance activites: Backward walking;Direction changes;Turns;Sudden stops;Head turns High Level Balance Comments: no limitations noted    Cognition Arousal/Alertness: Awake/alert Behavior During Therapy: Impulsive Overall Cognitive Status: History of cognitive impairments - at baseline Area of Impairment: Memory;Safety/judgement     Memory: Decreased short-term memory Following Commands: Follows multi-step commands inconsistently Safety/Judgement: Decreased awareness of safety Awareness: Emergent Problem Solving: Slow processing General Comments: Pt moves quickly and at times without fully listening to instructions before she begins. As she began to "show off" and jog backwards, she became slightly unsteady and stopped stating she did not want to fall.     Exercises      General Comments General comments (skin integrity, edema, etc.): Grandson present and reports her ambulation appears baseline to him. Noted he has never seen her jog!      Pertinent Vitals/Pain Pain Assessment: No/denies pain    Home Living                      Prior Function            PT Goals (current goals can now be found in the care plan section) Acute Rehab PT Goals Patient Stated Goal: "to get some food" Progress towards PT goals: Progressing toward goals    Frequency  Min 3X/week    PT Plan  Discharge plan needs to be updated    Co-evaluation             End of Session Equipment Utilized During Treatment: Gait belt Activity Tolerance: Patient tolerated treatment well Patient left: in chair;with call bell/phone within reach;with family/visitor present     Time: 1420-1433 PT Time Calculation (min): 13 min  Charges:  $Gait Training: 8-22 mins                    G Codes:       Caleigha Zale 03-03-2014, 2:45 PM Pager [phone]

## 2014-02-01 NOTE — Clinical Social Work Placement (Addendum)
[place] WORK PLACEMENT NOTE 02/01/2014  Patient:  Rachael Perez, Rachael Perez  Account Number:  [id number] Admit date:  01/30/2014  Clinical Social Worker:  Glendon Axe, CLINICAL SOCIAL WORKER  Date/time:  02/01/2014 05:23 PM  Clinical Social Work is seeking post-discharge placement for this patient at the following level of care:   ASSISTED LIVING/REST HOME   (*CSW will update this form in Epic as items are completed)   02/01/2014  Patient/family provided with [facility] Department of Clinical Social Work's list of facilities offering this level of care within the geographic area requested by the patient (or if unable, by the patient's family).  02/01/2014  Patient/family informed of their freedom to choose among providers that offer the needed level of care, that participate in Medicare, Medicaid or managed care program needed by the patient, have an available bed and are willing to accept the patient.  02/01/2014  Patient/family informed of [facility]' ownership interest in [facility], as well as of the fact that they are under no obligation to receive care at this facility.  PASARR submitted to EDS on  PASARR number received on   FL2 transmitted to all facilities in geographic area requested by pt/family on  02/01/2014 FL2 transmitted to all facilities within larger geographic area on   Patient informed that his/her managed care company has contracts with or will negotiate with  certain facilities, including the following:  YES.   Patient/family informed of bed offers received:  02/02/2014  Patient chooses bed at  [place] [facility] as independent resident. Physician recommends and patient chooses bed at    Patient to be transferred to [place] [facility]  on  02/02/2014 Patient to be transferred to facility by [facility]  Patient and family notified of transfer on 02/02/2014 Name of family  member notified: Pt's son, Prajna Vanderpool and Aryanna Shaver.  The following physician request were entered in Epic: Yes.    Additional CommentsGlendon Axe, MSW, LCSWA [phone] 02/01/2014 5:24 PM

## 2014-02-02 NOTE — Progress Notes (Signed)
Discharge orders received.  Discharge instructions and packet given to the patient's son.  Transported out via wheelchair, family present.  VSS upon discharge.  IV removed and education complete. Cori Razor, RN 02/02/2014 1:34 PM
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
## 2014-02-02 NOTE — Progress Notes (Signed)
Clinical Social Worker spoke with pt's son, Dr. Elvin So (healthcare POA) in reference to post-acute placement for pt. Pt's son stated he would like pt to return to [facility] as an independent resident and that he was consulting with the facility for additional assisted care services. Pt's son [name]) also agreeable of pt's return at [facility] as a "bridge to assisted living" resident. CSW has notified facility of pt's return and admissions director reported arranging additional assisted care for pt. Pt and pt's family all agreeable to pt's return to [facility] assisted living as an independent resident with additional care needs. CSW notified nurse of pt's discharge and has arranged [facility] transportation. No further intervention needed. CSW signing off.   Glendon Axe, MSW, LCSWA [phone] 02/02/2014 12:40 PM

## 2014-02-02 NOTE — Discharge Summary (Signed)
Physician Discharge Summary  Rachael Perez:[id number] DOB: 1926/05/08 DOA: 01/30/2014  PCP: Hollace Kinnier, DO  Admit date: 01/30/2014 Discharge date: 02/02/2014  Time spent: 35 minutes  Recommendations for Outpatient Follow-up:  1. Please follow up on her blood pressures, she was discharged on Norvasc 10 mg PO q daily  2. Ceftin anticipated stop date: 02/05/2014 3. I spoke with her Son who is HCPOA Dr Baldo Daub, who requested she be transferred back to her independent living facility as he would set up a home aide to be with her over the weekend and assist with medication management.    Discharge Diagnoses:  Principal Problem:   Acute encephalopathy Active Problems:   Hypertensive urgency   UTI (urinary tract infection)   Dementia with behavioral disturbance   Discharge Condition: Stable  Diet recommendation: Heart Healthy  Filed Weights   01/30/14 1839  Weight: 46.3 kg (102 lb 1.2 oz)    History of present illness:  Rachael Perez is a 80 y.o. female  With a history of hypertension, breast cancer, dementia that presented to the emergency department from assisted living community. Patient was sent to emergency department for confusion. Per patient's son, who is at bedside, he stated that he saw her yesterday evening and she was in her normal and usual state of health. Patient has not had any recent complaints of abdominal pain diarrhea or constipation, chest pain, shortness of breath. He states that she is currently at her baseline. Patient does have an [place] accent, and normally has pressured speech. At this time, patient states she "feels fine" and would like something to eat. She currently has no complaints.  In the emergency room, CT of the head was conducted showing possible small posterior limb infarct. [facility] was called for admission.   Hospital Course:  Patient is a pleasant 80 year old female with a past medical history of hypertension, cognitive  impairment, who was admitted to the medicine service on 01/30/2014. She was found to have mental status changes at her independent-living facility for which she was transferred to the emergency department at [facility] for further evaluation. Patient was seen and evaluated by neurology, as she underwent stroke workup. An MRI of the brain did not reveal acute stroke.  1. Acute encephalopathy -Possibilities include TIA, hypertensive encephalopathy, underlying infectious process.  -MRI of the brain did not show evidence of acute CVA  -Chest x-ray did not show evidence of pneumonia however urinalysis did show the presence of leukocytes and a few bacteria.  -She was started on emperic antimicrobial therapy with ceftin to treat possible UTI -Patient functioning at her baseline by 02/01/2014 -Spoke we her son Dr Baldo Daub who is her HCPOA, requesting for her to be discharged back to her independently facility. He felt that she would likely do better in her familiar environment as opposed to going to an assisted living facility. He would set her up with home aide who would be with her through the weekend, assisting her with medication administration. 2. Possible urinary tract infection  -Patient with history of dementia presenting with acute encephalopathy  -Urinalysis showing the presence of a few bacteria and leukocytes.  -Given recent decline she was started on empiric treatment with Ceftin  3. Hypertension.  -Patient's blood pressures improving -Initially had systolic blood pressure of 214  -Will discharge on Norvasc 10 mg PO q daily  Consultations:  Physical Therapy  Occupational Therapy  Speech  Neurology  Social Services  Discharge Exam: Filed Vitals:   02/02/14  1037  BP: 144/47  Pulse: 92  Temp: 98.1 F (36.7 C)  Resp: 20   General: Patient is awake and alert, was oriented to person and place  Cardiovascular: regular rate and rhythm, normal S1S2  Respiratory: Clear to  auscultation bilaterally  Abdomen: Soft, nontender nondistended  Musculoskeletal: no edema Neurological: She had a nonfocal exam, 5/5 muscle strength to bilateral extremities.     Discharge Instructions You were cared for by a hospitalist during your hospital stay. If you have any questions about your discharge medications or the care you received while you were in the hospital after you are discharged, you can call the unit and asked to speak with the hospitalist on call if the hospitalist that took care of you is not available. Once you are discharged, your primary care physician will handle any further medical issues. Please note that NO REFILLS for any discharge medications will be authorized once you are discharged, as it is imperative that you return to your primary care physician (or establish a relationship with a primary care physician if you do not have one) for your aftercare needs so that they can reassess your need for medications and monitor your lab values.  Discharge Instructions   Call MD for:  difficulty breathing, headache or visual disturbances    Complete by:  As directed      Call MD for:  difficulty breathing, headache or visual disturbances    Complete by:  As directed      Call MD for:  extreme fatigue    Complete by:  As directed      Call MD for:  extreme fatigue    Complete by:  As directed      Call MD for:  hives    Complete by:  As directed      Call MD for:  hives    Complete by:  As directed      Call MD for:  persistant dizziness or light-headedness    Complete by:  As directed      Call MD for:  persistant dizziness or light-headedness    Complete by:  As directed      Call MD for:  persistant nausea and vomiting    Complete by:  As directed      Call MD for:  persistant nausea and vomiting    Complete by:  As directed      Call MD for:  redness, tenderness, or signs of infection (pain, swelling, redness, odor or green/yellow discharge around incision  site)    Complete by:  As directed      Call MD for:  redness, tenderness, or signs of infection (pain, swelling, redness, odor or green/yellow discharge around incision site)    Complete by:  As directed      Call MD for:  severe uncontrolled pain    Complete by:  As directed      Call MD for:  severe uncontrolled pain    Complete by:  As directed      Call MD for:  temperature >100.4    Complete by:  As directed      Call MD for:  temperature >100.4    Complete by:  As directed      Diet - low sodium heart healthy    Complete by:  As directed      Diet - low sodium heart healthy    Complete by:  As directed      Increase  activity slowly    Complete by:  As directed      Increase activity slowly    Complete by:  As directed           Current Discharge Medication List    START taking these medications   Details  amLODipine (NORVASC) 10 MG tablet Take 1 tablet (10 mg total) by mouth daily. Qty: 30 tablet, Refills: 1    cefUROXime (CEFTIN) 250 MG tablet Take 1 tablet (250 mg total) by mouth 2 (two) times daily with a meal. Qty: 8 tablet, Refills: 0    senna-docusate (SENOKOT-S) 8.6-50 MG per tablet Take 1 tablet by mouth at bedtime as needed for mild constipation or moderate constipation. Qty: 30 tablet, Refills: 1      CONTINUE these medications which have CHANGED   Details  aspirin 325 MG tablet Take 1 tablet (325 mg total) by mouth daily. Qty: 30 tablet, Refills: 1      CONTINUE these medications which have NOT CHANGED   Details  MELATONIN PO Take by mouth at bedtime.       Allergies  Allergen Reactions  . Codeine Shortness Of Breath  . Lodine [Etodolac]    Follow-up Information   Follow up with REED, TIFFANY, DO In 1 week.   Specialty:  Geriatric Medicine   Contact information:   [address] [place] [address] [phone]       Follow up with [facility] In 3 weeks.   Contact information:   [address] [place]  [address] [phone]      Follow up with REED, TIFFANY, DO In 2 weeks.   Specialty:  Geriatric Medicine   Contact information:   [place] [place] [address] [phone]        The results of significant diagnostics from this hospitalization (including imaging, microbiology, ancillary and laboratory) are listed below for reference.    Significant Diagnostic Studies: Dg Chest 2 View  01/31/2014   CLINICAL DATA:  Cardiac palpitations. No chest pain. History of breast cancer.  EXAM: CHEST  2 VIEW  COMPARISON:  01/30/2014.  FINDINGS: Emphysema is present. Chronic pulmonary parenchymal scarring. Surgical clips over the RIGHT breast. No airspace disease. The patient is cachectic. Aortic arch atherosclerosis. Chronic bilateral pleural apical scarring appears similar. LEFT axillary dissection clips. Surgical clips over the LEFT breast also noted.  No pleural effusion.  IMPRESSION: Emphysema and pulmonary parenchymal scarring without acute cardiopulmonary disease.   Electronically Signed   By: Dereck Ligas M.D.   On: 01/31/2014 14:55   Dg Chest 2 View  01/30/2014   CLINICAL DATA:  Altered mental status and weakness. History of breast carcinoma  EXAM: CHEST  2 VIEW  COMPARISON:  Chest radiograph December 10, 2009 and chest CT June 01, 2011  FINDINGS: There is underlying emphysematous change. There are multiple areas of scarring and fibrotic change in both upper lobes, essentially stable. There is no frank edema or consolidation. Heart size is normal. Pulmonary vascularity is consistent with the underlying emphysematous change. There are multiple surgical clips on the left, stable. There is no adenopathy. There is atherosclerotic change in the aorta. There are no appreciable bone lesions.  IMPRESSION: Underlying emphysema. Scarring with areas of fibrotic type change in both upper lobes. No frank edema or consolidation. No change in cardiac silhouette.   Electronically Signed   By: Lowella Grip M.D.   On: 01/30/2014 14:13   Ct Head Wo Contrast  01/30/2014   CLINICAL DATA:  Altered mental status/confusion, progressed recently  EXAM: CT HEAD WITHOUT CONTRAST  TECHNIQUE: Contiguous axial images were obtained from the base of the skull through the vertex without intravenous contrast.  COMPARISON:  None.  FINDINGS: There is mild diffuse atrophy. There is no appreciable mass, hemorrhage, extra-axial fluid collection, or midline shift. There is patchy small vessel disease in the centra semiovale bilaterally. There is a small focus of decreased attenuation in the posterior limb of the left extreme capsule which may represent a small acute infarct. This finding is seen on slice 13 series 621. No other findings concerning for potential acute infarct present. Bony calvarium appears intact. Mastoid air cells are clear. There is opacification of a mid left ethmoid air cell. There are vertebral artery calcifications bilaterally.  IMPRESSION: Atrophy with periventricular small vessel disease. Suspect small acute infarct in the posterior limb of the left extreme capsule. No hemorrhage or mass effect. Left ethmoid sinus disease. Bilateral vertebral artery calcification.   Electronically Signed   By: Lowella Grip M.D.   On: 01/30/2014 13:57   Mr Brain Wo Contrast  01/31/2014   CLINICAL DATA:  Altered mental status. Dementia. Word-finding difficulty. History of hypertension, breast cancer, and shortness of breath. Increased confusion in the setting of elevated blood pressure.  EXAM: MRI HEAD WITHOUT CONTRAST  MRA HEAD WITHOUT CONTRAST  TECHNIQUE: Multiplanar, multiecho pulse sequences of the brain and surrounding structures were obtained without intravenous contrast. Angiographic images of the head were obtained using MRA technique without contrast.  COMPARISON:  CT head 01/30/2014.  FINDINGS: MRI HEAD FINDINGS  No evidence for acute infarction, hemorrhage, mass lesion, hydrocephalus, or extra-axial  fluid. Moderate cerebral and cerebellar atrophy. T2 and FLAIR hyperintensities throughout the periventricular and subcortical white matter also affecting the brainstem consistent with moderately advanced small vessel disease. No posterior circulation cortical edema to suggest PRES. Flow voids are maintained. No areas of intracranial hemorrhage are evident. Unremarkable midline structures. No carotid or basilar stenosis. Extracranial soft tissues unremarkable. No osseous lesions.  Compared with prior CT, the area questioned of acute infarction represents a chronic lacune(s).  MRA HEAD FINDINGS  The internal carotid arteries and basilar artery are widely patent. Both vertebrals contribute to formation of the basilar. There is no flow reducing stenosis of the anterior, or middle cerebral arteries. Moderately severe distal P1 RIGHT PCA stenosis estimated 75%. Similar 75% stenosis LEFT P2 ambient segment. No cerebellar branch occlusion.  IMPRESSION: Chronic changes as described. No acute stroke or evidence for hypertensive encephalopathy. No intracranial hemorrhage is evident.  BILATERAL PCA stenoses without visible chronic or acute posterior circulation infarct.   Electronically Signed   By: Rolla Flatten M.D.   On: 01/31/2014 13:13   Mr Jodene Nam Head/brain Wo Cm  01/31/2014   CLINICAL DATA:  Altered mental status. Dementia. Word-finding difficulty. History of hypertension, breast cancer, and shortness of breath. Increased confusion in the setting of elevated blood pressure.  EXAM: MRI HEAD WITHOUT CONTRAST  MRA HEAD WITHOUT CONTRAST  TECHNIQUE: Multiplanar, multiecho pulse sequences of the brain and surrounding structures were obtained without intravenous contrast. Angiographic images of the head were obtained using MRA technique without contrast.  COMPARISON:  CT head 01/30/2014.  FINDINGS: MRI HEAD FINDINGS  No evidence for acute infarction, hemorrhage, mass lesion, hydrocephalus, or extra-axial fluid. Moderate  cerebral and cerebellar atrophy. T2 and FLAIR hyperintensities throughout the periventricular and subcortical white matter also affecting the brainstem consistent with moderately advanced small vessel disease. No posterior  circulation cortical edema to suggest PRES. Flow voids are maintained. No areas of intracranial hemorrhage are evident. Unremarkable midline structures. No carotid or basilar stenosis. Extracranial soft tissues unremarkable. No osseous lesions.  Compared with prior CT, the area questioned of acute infarction represents a chronic lacune(s).  MRA HEAD FINDINGS  The internal carotid arteries and basilar artery are widely patent. Both vertebrals contribute to formation of the basilar. There is no flow reducing stenosis of the anterior, or middle cerebral arteries. Moderately severe distal P1 RIGHT PCA stenosis estimated 75%. Similar 75% stenosis LEFT P2 ambient segment. No cerebellar branch occlusion.  IMPRESSION: Chronic changes as described. No acute stroke or evidence for hypertensive encephalopathy. No intracranial hemorrhage is evident.  BILATERAL PCA stenoses without visible chronic or acute posterior circulation infarct.   Electronically Signed   By: Rolla Flatten M.D.   On: 01/31/2014 13:13    Microbiology: No results found for this or any previous visit (from the past 240 hour(s)).   Labs: Basic Metabolic Panel:  Recent Labs Lab 01/30/14 1420  NA 135*  K 4.0  CL 94*  CO2 27  GLUCOSE 95  BUN 21  CREATININE 0.79  CALCIUM 9.4   Liver Function Tests:  Recent Labs Lab 01/30/14 1420  AST 27  ALT 15  ALKPHOS 56  BILITOT 1.0  PROT 7.2  ALBUMIN 3.9    Recent Labs Lab 01/30/14 1420  LIPASE 66*   No results found for this basename: AMMONIA,  in the last 168 hours CBC:  Recent Labs Lab 01/30/14 1225  WBC 6.9  NEUTROABS 5.1  HGB 14.3  HCT 41.3  MCV 90.6  PLT 247   Cardiac Enzymes: No results found for this basename: CKTOTAL, CKMB, CKMBINDEX, TROPONINI,   in the last 168 hours BNP: BNP (last 3 results) No results found for this basename: PROBNP,  in the last 8760 hours CBG: No results found for this basename: GLUCAP,  in the last 168 hours     Signed:  Kelvin Cellar  [facility] 02/02/2014, 1:23 PM

## 2014-02-02 NOTE — Progress Notes (Signed)
CARE MANAGEMENT NOTE 02/02/2014  Patient:  Rachael Perez, Rachael Perez   Account Number:  [id number]  Date Initiated:  02/01/2014  Documentation initiated by:  Lorne Skeens  Subjective/Objective Assessment:   Patient was admitted with TIA. Resides at [facility]     Action/Plan:   Will follow for discharge needs pending PT/OT evals and physician orders.   Anticipated DC Date:  02/02/2014   Anticipated DC Plan:  ASSISTED LIVING / REST HOME  In-house referral  Clinical Social Worker         Choice offered to / List presented to:             Status of service:   Medicare Important Message given?  YES (If response is "NO", the following Medicare IM given date fields will be blank) Date Medicare IM given:  02/02/2014 Medicare IM given by:  Lorne Skeens Date Additional Medicare IM given:   Additional Medicare IM given by:    Discharge Disposition:    Per UR Regulation:  Reviewed for med. necessity/level of care/duration of stay  If discussed at [place] of Stay Meetings, dates discussed:    Comments:  02/02/14 [place], MSN, CM- Medicare IM letter provided.

## 2014-02-06 ENCOUNTER — Other Ambulatory Visit: Payer: Self-pay | Admitting: *Deleted

## 2014-02-06 ENCOUNTER — Telehealth: Payer: Self-pay | Admitting: *Deleted

## 2014-02-06 NOTE — Telephone Encounter (Signed)
Dr. Elvin So, Son called and stated that he wants to speak with Dr. Mariea Clonts regarding patient. Please Call. # [phone]

## 2014-02-08 ENCOUNTER — Other Ambulatory Visit: Payer: Self-pay | Admitting: Internal Medicine

## 2014-02-08 DIAGNOSIS — F0390 Unspecified dementia without behavioral disturbance: Secondary | ICD-10-CM

## 2014-02-08 MED ORDER — MEMANTINE HCL ER 7 & 14 & 21 &28 MG PO CP24: ORAL_CAPSULE | ORAL | Status: DC

## 2014-02-08 MED ORDER — MEMANTINE HCL ER 28 MG PO CP24: 28.0000 mg | ORAL_CAPSULE | Freq: Every day | ORAL | Status: DC

## 2014-02-08 NOTE — Telephone Encounter (Signed)
The other son, [name] would like to speak with you also. He has some concerns regarding Memory Medication. Please Call # [phone]

## 2014-02-08 NOTE — Telephone Encounter (Signed)
Spoke with [name] who requested we start his mom on medication.  We decided on namenda XR for her.  Titration pack and 28mg  faxed into pharmacy.  He said he will notify his brother who will pick them up.

## 2014-02-08 NOTE — Telephone Encounter (Signed)
Left message with Rachael Perez today to return my call here at the office.

## 2014-02-22 ENCOUNTER — Ambulatory Visit: Payer: Medicare Other | Admitting: Internal Medicine

## 2014-02-22 DIAGNOSIS — Z0289 Encounter for other administrative examinations: Secondary | ICD-10-CM

## 2014-02-26 ENCOUNTER — Ambulatory Visit: Payer: Self-pay | Admitting: Internal Medicine

## 2014-03-02 ENCOUNTER — Other Ambulatory Visit: Payer: Self-pay | Admitting: *Deleted

## 2014-03-19 ENCOUNTER — Other Ambulatory Visit: Payer: Self-pay | Admitting: *Deleted

## 2014-03-19 MED ORDER — HYDROCHLOROTHIAZIDE 25 MG PO TABS: 25.0000 mg | ORAL_TABLET | Freq: Every day | ORAL | Status: DC

## 2014-03-29 ENCOUNTER — Encounter: Payer: Self-pay | Admitting: Internal Medicine

## 2014-03-29 ENCOUNTER — Ambulatory Visit (INDEPENDENT_AMBULATORY_CARE_PROVIDER_SITE_OTHER): Payer: Medicare Other | Admitting: Internal Medicine

## 2014-03-29 VITALS — BP 150/78 | HR 88 | Temp 97.7°F | Resp 18 | Wt 100.0 lb

## 2014-03-29 DIAGNOSIS — M810 Age-related osteoporosis without current pathological fracture: Secondary | ICD-10-CM

## 2014-03-29 DIAGNOSIS — I10 Essential (primary) hypertension: Secondary | ICD-10-CM | POA: Diagnosis not present

## 2014-03-29 DIAGNOSIS — Z5181 Encounter for therapeutic drug level monitoring: Secondary | ICD-10-CM

## 2014-03-29 DIAGNOSIS — F5105 Insomnia due to other mental disorder: Secondary | ICD-10-CM | POA: Diagnosis not present

## 2014-03-29 DIAGNOSIS — N3945 Continuous leakage: Secondary | ICD-10-CM

## 2014-03-29 DIAGNOSIS — F0150 Vascular dementia without behavioral disturbance: Secondary | ICD-10-CM

## 2014-03-29 DIAGNOSIS — I639 Cerebral infarction, unspecified: Secondary | ICD-10-CM | POA: Diagnosis not present

## 2014-03-29 DIAGNOSIS — F419 Anxiety disorder, unspecified: Secondary | ICD-10-CM | POA: Diagnosis not present

## 2014-03-29 NOTE — Progress Notes (Signed)
Patient ID: Rachael Perez, female   DOB: [birth year], 80 y.o.   MRN: [id number]   Location:  [facility]  Code Status: full code  Allergies  Allergen Reactions  . Codeine Shortness Of Breath  . Lodine [Etodolac]     Chief Complaint  Patient presents with  . Medical Management of Chronic Issues  . Acute Visit    HPI: Patient is a 80 y.o. seen in the office today for medical mgt of chronic diseases and acute visit due to decline in mentation, severe anxiety and not taking her medications as directed.  Does not have battery for right hearing aide.  Does not remember being in the hospital.  Eating a lot of chocolate, and her meals at [employer]--moved to assisted living level.   Admits to bladder leakage and using bad.  Varicosities of ankles and was bumped into by wheelchair and caused pain in legs.  Has not been driving since she's been in the hospital.  She says her driving record is fabulous, but her son says her driving is dangerous.  Uses pillbox for medications.  Brintellix did not work well for her--seems it led to her episode of confusion before her hospitalization.    Review of Systems:  Review of Systems  Constitutional: Negative for fever and chills.  HENT: Negative for congestion.   Eyes: Negative for blurred vision.  Respiratory: Negative for shortness of breath.   Cardiovascular: Negative for chest pain.  Gastrointestinal: Negative for abdominal pain.  Genitourinary: Negative for dysuria, urgency and frequency.       Leakage  Musculoskeletal: Negative for falls.  Skin: Negative for rash.  Neurological: Negative for dizziness.  Endo/Heme/Allergies: Bruises/bleeds easily.  Psychiatric/Behavioral: Positive for depression and memory loss. The patient is nervous/anxious and has insomnia.     Past Medical History  Diagnosis Date  . Hypertension   . Palpitations   . SOB (shortness of breath) on exertion   . History of  medication noncompliance   . Varicose veins   . Wears hearing aid   . Abnormal chest CT     Lesion is unchanged; Followed by Dr. Benay Spice  . Senile osteoporosis   . Anxiety   . Breast cancer 08/1994    Remote lumpectomy on L  . Breast cancer 05/2011    R breast with lumpectomy and XRT therapy  . Arthritis   . Vitamin D deficiency   . Other B-complex deficiencies   . Dementia in conditions classified elsewhere without behavioral disturbance   . Malignant neoplasm of breast (female), unspecified site   . Chronic pain   . Varicose veins of lower extremities with inflammation   . Hallucinations   . Insomnia, unspecified     Past Surgical History  Procedure Laterality Date  . Tonsillectomy    . Tubal ligation  1980  . Breast lumpectomy  09/01/2011  . Cataract extraction, bilateral    . US echocardiography  12/17/2009    EF 55-60%  . Breast lumpectomy  1996    left breast  . Varicose vein removal  1970    Social History:   reports that she quit smoking about 61 years ago. Her smoking use included Cigarettes. She smoked 0.00 packs per day. She has never used smokeless tobacco. She reports that she drinks alcohol. She reports that she does not use illicit drugs.  Family History  Problem Relation Age of Onset  . Heart disease Mother   . Heart disease Father   .  Heart disease Sister   . Heart disease Brother   . Arthritis Brother   . Heart disease Sister   . Alzheimer's disease Sister   . Cancer Neg Hx     Medications: Patient's Medications  New Prescriptions   No medications on file  Previous Medications   AMLODIPINE (NORVASC) 10 MG TABLET    Take 1 tablet (10 mg total) by mouth daily.   ASPIRIN 325 MG TABLET    Take 1 tablet (325 mg total) by mouth daily.   CEFUROXIME (CEFTIN) 250 MG TABLET    Take 1 tablet (250 mg total) by mouth 2 (two) times daily with a meal.   HYDROCHLOROTHIAZIDE (HYDRODIURIL) 25 MG TABLET    Take 1 tablet (25 mg total) by mouth daily.   MELATONIN  PO    Take by mouth at bedtime.   MEMANTINE HCL ER (NAMENDA XR TITRATION PACK) 7 & 14 & 21 &28 MG CP24    Take one tablet daily, when complete pack, begin 28mg  pill prescription   MEMANTINE HCL ER (NAMENDA XR) 28 MG CP24    Take 28 mg by mouth daily.   SENNA-DOCUSATE (SENOKOT-S) 8.6-50 MG PER TABLET    Take 1 tablet by mouth at bedtime as needed for mild constipation or moderate constipation.  Modified Medications   No medications on file  Discontinued Medications   No medications on file     Physical Exam: Filed Vitals:   03/29/14 0845  BP: 150/78  Pulse: 88  Temp: 97.7 F (36.5 C)  Resp: 18   Physical Exam  Constitutional: She is oriented to person, place, and time.  Thin white female  Cardiovascular: Normal rate, regular rhythm, normal heart sounds and intact distal pulses.   Varicose veins  Pulmonary/Chest: Effort normal and breath sounds normal. No respiratory distress.  Abdominal: Soft. Bowel sounds are normal. She exhibits no distension and no mass. There is no tenderness.  Musculoskeletal: Normal range of motion.  Neurological: She is alert and oriented to person, place, and time.  Skin: Skin is warm and dry.  Psychiatric:  Very anxious female, tearful today     Labs reviewed: Basic Metabolic Panel:  Recent Labs  01/18/14 1417 01/30/14 1420  NA 138 135*  K 4.3 4.0  CL 98 94*  CO2 26 27  GLUCOSE 96 95  BUN 20 21  CREATININE 0.81 0.79  CALCIUM 9.6 9.4  TSH 0.842  --    Liver Function Tests:  Recent Labs  01/18/14 1417 01/30/14 1420  AST 23 27  ALT 11 15  ALKPHOS 64 56  BILITOT 0.6 1.0  PROT 6.6 7.2  ALBUMIN  --  3.9    Recent Labs  01/30/14 1420  LIPASE 66*   No results for input(s): AMMONIA in the last 8760 hours. CBC:  Recent Labs  01/18/14 1417 01/30/14 1225  WBC 6.1 6.9  NEUTROABS 4.4 5.1  HGB 13.3 14.3  HCT 39.8 41.3  MCV 93 90.6  PLT 214 247   Lipid Panel:  Recent Labs  01/31/14 0605  CHOL 182  HDL 71  LDLCALC 97    TRIG 70  CHOLHDL 2.6   Lab Results  Component Value Date   HGBA1C 5.8* 01/31/2014   Assessment/Plan 1. Essential hypertension, benign -bp elevated and supposedly she is on hctz and amlodipine though I don't think she is taking them like she says  2. Medication monitoring encounter - if she is on hctz, needs the bmp checked--would favor taking her off  of this anyway due to her dizziness on occasion and poor balance - Basic metabolic panel  3. Vascular dementia without behavioral disturbance -is getting worse -she is no longer safe to drive -she is not taking her meds as directed and needs someone to supervise her doing this--explained both of these concerns to her son--he has actually taken her keys since her hospitalization  4. Senile osteoporosis -cont ca and D -refuses fosamax due to dental problems  5. Insomnia secondary to anxiety -cont melatonin 1 hour before bed  6. Continuous leakage of urine -is not a good candidate for anticholinergic medications  Copies were made of her AVS for her two sons, one to be mailed.  I also explained mychart to them.  Labs/tests ordered:   Orders Placed This Encounter  Procedures  . Basic metabolic panel   Next appt:  3 mos  Erickson Yamashiro L. Council Munguia, D.O. [facility] [address], [place] [address] Cell Phone (Mon-Fri 8am-5pm):  [phone] On Call:  [phone] & follow prompts after 5pm & weekends Office Phone:  [phone] Office Fax:  [phone]

## 2014-03-30 ENCOUNTER — Other Ambulatory Visit: Payer: Self-pay

## 2014-03-30 ENCOUNTER — Encounter: Payer: Self-pay | Admitting: *Deleted

## 2014-03-30 DIAGNOSIS — F0390 Unspecified dementia without behavioral disturbance: Secondary | ICD-10-CM

## 2014-03-30 LAB — BASIC METABOLIC PANEL
BUN/Creatinine Ratio: 36 — ABNORMAL HIGH (ref 11–26)
BUN: 31 mg/dL — ABNORMAL HIGH (ref 8–27)
CO2: 27 mmol/L (ref 18–29)
Calcium: 9.9 mg/dL (ref 8.7–10.3)
Chloride: 96 mmol/L — ABNORMAL LOW (ref 97–108)
Creatinine, Ser: 0.86 mg/dL (ref 0.57–1.00)
GFR calc Af Amer: 71 mL/min/{1.73_m2} (ref >59)
GFR calc non Af Amer: 62 mL/min/{1.73_m2} (ref >59)
Glucose: 102 mg/dL — ABNORMAL HIGH (ref 65–99)
Potassium: 3.8 mmol/L (ref 3.5–5.2)
Sodium: 140 mmol/L (ref 134–144)

## 2014-03-30 NOTE — Progress Notes (Signed)
Patient ID: Rachael Perez, female   DOB: 21-Feb-1927, 80 y.o.   MRN: [id number] Spoke with Dr. Laureen Abrahams by phone.  He would like to talk with his mom about restricting her driving to local places she typically goes to avoid limiting her independence so much.  I agreed to do this.  He will be back in touch with me.  We will send him a copy of the AVS with the mychart information also so he can see her updated medications and communicate with me this way.

## 2014-04-04 ENCOUNTER — Telehealth: Payer: Self-pay | Admitting: *Deleted

## 2014-04-04 NOTE — Telephone Encounter (Signed)
Needs to change her appointment for 04/05/14. Currently residing at [facility] at [facility]. Please call her cell # [phone]. Forwarded message to scheduler.

## 2014-04-05 ENCOUNTER — Ambulatory Visit: Payer: Medicare Other | Admitting: Oncology

## 2014-04-05 ENCOUNTER — Telehealth: Payer: Self-pay | Admitting: Oncology

## 2014-04-05 NOTE — Telephone Encounter (Signed)
Lft msg for pt to call us back to r/s 12/17 apt. KJ

## 2014-05-09 ENCOUNTER — Ambulatory Visit (INDEPENDENT_AMBULATORY_CARE_PROVIDER_SITE_OTHER): Payer: Medicare Other | Admitting: Internal Medicine

## 2014-05-09 ENCOUNTER — Encounter: Payer: Self-pay | Admitting: Internal Medicine

## 2014-05-09 VITALS — BP 120/68 | HR 94 | Temp 97.4°F | Resp 12 | Ht 67.0 in | Wt 98.0 lb

## 2014-05-09 DIAGNOSIS — N644 Mastodynia: Secondary | ICD-10-CM | POA: Diagnosis not present

## 2014-05-09 DIAGNOSIS — I10 Essential (primary) hypertension: Secondary | ICD-10-CM

## 2014-05-09 DIAGNOSIS — Z853 Personal history of malignant neoplasm of breast: Secondary | ICD-10-CM | POA: Diagnosis not present

## 2014-05-09 DIAGNOSIS — F0390 Unspecified dementia without behavioral disturbance: Secondary | ICD-10-CM | POA: Diagnosis not present

## 2014-05-09 MED ORDER — MEMANTINE HCL ER 28 MG PO CP24: 28.0000 mg | ORAL_CAPSULE | Freq: Every day | ORAL | Status: DC

## 2014-05-09 NOTE — Progress Notes (Signed)
Patient ID: Rachael Perez, female   DOB: February 08, 1927, 81 y.o.   MRN: [id number]    Facility  [facility]    Place of Service:   OFFICE   Allergies  Allergen Reactions  . Codeine Shortness Of Breath  . Lodine [Etodolac]     Chief Complaint  Patient presents with  . Acute Visit    Patient c/o pain in breast when swallowing, dry cough x a few days    HPI:  81 yo female c/o left shoulder and lateral CW pain after reaching with LUE to stop a solid glass lamp from hitting the floor after it was falling off nightstand. She has left breast pain with swallowing. Pain has been present x several weeks but worsened the last few weeks. No rash. Hx left breast CA and is c/a cancer returning. Last mammogram she cannot remember if she had one in the last year. She has an appt with her oncologist soon  BP elevated 2 days ago at 200/90 with SOB/disoriented and family c/a whether she is taking meds daily. Family attempting to get home care aid to help with her medications. HCPOA, Dr Baldo Daub, is a Psychologist, sport and exercise in [place], [place]. Did discuss her sx's with her youngest son today.  Medications: Patient's Medications  New Prescriptions   No medications on file  Previous Medications   AMLODIPINE (NORVASC) 10 MG TABLET    Take 1 tablet (10 mg total) by mouth daily.   ASPIRIN 325 MG TABLET    Take 1 tablet (325 mg total) by mouth daily.   HYDROCHLOROTHIAZIDE (HYDRODIURIL) 25 MG TABLET    Take 1 tablet (25 mg total) by mouth daily.   MELATONIN PO    Take by mouth at bedtime.  Modified Medications   Modified Medication Previous Medication   MEMANTINE (NAMENDA XR) 28 MG CP24 24 HR CAPSULE Memantine HCl ER (NAMENDA XR) 28 MG CP24      Take 1 capsule (28 mg total) by mouth daily.    Take 28 mg by mouth daily.  Discontinued Medications   SENNA-DOCUSATE (SENOKOT-S) 8.6-50 MG PER TABLET    Take 1 tablet by mouth at bedtime as needed for mild constipation or moderate constipation.     Review of Systems  As above.  All other sx's reviewed are negative  Filed Vitals:   05/09/14 0933  BP: 120/68  Pulse: 94  Temp: 97.4 F (36.3 C)  TempSrc: Oral  Resp: 12  Height: 5\' 7"  (1.702 m)  Weight: 98 lb (44.453 kg)  SpO2: 98%   Body mass index is 15.35 kg/(m^2).  Physical Exam  CONSTITUTIONAL: Looks well in NAD. Awake, alert and oriented x 3 HEENT: PERRLA. Oropharynx clear and without exudate. MMM CVS: Regular rate without murmur, gallop or rub. LUNGS: CTA b/l no wheezing, rales or rhonchi. CP reproducible EXTREMITIES: No edema b/l. Distal pulses palpable. No calf tenderness PSYCH: Affect, behavior and mood normal SKIN: no rash. W/d/i BREASTS: left breast scar tissue at 12 o'clock position, TTP. No palpable right mass. No nipple d/cor skin dimpling b/l. MUSC: FROM left shoulder with crepitus.  Labs reviewed: Office Visit on 03/29/2014  Component Date Value Ref Range Status  . Glucose 03/29/2014 102* 65 - 99 mg/dL Final  . BUN 03/29/2014 31* 8 - 27 mg/dL Final  . Creatinine, Ser 03/29/2014 0.86  0.57 - 1.00 mg/dL Final  . GFR calc non Af Amer 03/29/2014 62  >59 mL/min/1.73 Final  . GFR calc Af Amer 03/29/2014 71  >59 mL/min/1.73 Final  .  BUN/Creatinine Ratio 03/29/2014 36* 11 - 26 Final  . Sodium 03/29/2014 140  134 - 144 mmol/L Final  . Potassium 03/29/2014 3.8  3.5 - 5.2 mmol/L Final  . Chloride 03/29/2014 96* 97 - 108 mmol/L Final  . CO2 03/29/2014 27  18 - 29 mmol/L Final  . Calcium 03/29/2014 9.9  8.7 - 10.3 mg/dL Final     Assessment/Plan    ICD-9-CM ICD-10-CM   1. Dementia, without behavioral disturbance 294.20 F03.90 memantine (NAMENDA XR) 28 MG CP24 24 hr capsule     DISCONTINUED: memantine (NAMENDA XR) 28 MG CP24 24 hr capsule  2. Pain of both breasts 611.71 N64.4 MM Digital Diagnostic Bilat  3. History of breast cancer in female V10.3 Z85.3 MM Digital Diagnostic Bilat  4. Essential hypertension, benign with fluctuating BPs probably due to inconsistent medication adherence 401.1  G66 Basic Metabolic Panel   --Continue all blood pressure medication as ordered. Have nursing check BP at home and monitor adherence to meds   --F/u for mammogram as ordered. Will call with results  --Keep appointment with Dr Mariea Clonts on Jan 29th.  --Push fluids.  --No heavy lifting   Davielle Lingelbach S. Perlie Gold  [facility] [address] [place], [place] [address] [phone] Office (Wednesdays and Fridays 8 AM - 5 PM) [phone] Cell (Monday-Friday 8 AM - 5 PM)

## 2014-05-09 NOTE — Patient Instructions (Signed)
Continue all blood pressure medication as ordered  F/u for mammogram as ordered. Will call with results  Keep appointment with Dr Mariea Clonts on Jan 29th.  Push fluids.  No heavy lifting

## 2014-05-10 ENCOUNTER — Ambulatory Visit: Payer: Medicare Other | Admitting: Internal Medicine

## 2014-05-10 ENCOUNTER — Telehealth: Payer: Self-pay

## 2014-05-10 LAB — BASIC METABOLIC PANEL
BUN / CREAT RATIO: 34 — AB (ref 11–26)
BUN: 29 mg/dL — AB (ref 8–27)
CHLORIDE: 90 mmol/L — AB (ref 97–108)
CO2: 28 mmol/L (ref 18–29)
CREATININE: 0.85 mg/dL (ref 0.57–1.00)
Calcium: 9.6 mg/dL (ref 8.7–10.3)
GFR calc non Af Amer: 63 mL/min/{1.73_m2} (ref >59)
GFR, EST AFRICAN AMERICAN: 72 mL/min/{1.73_m2} (ref >59)
Glucose: 79 mg/dL (ref 65–99)
POTASSIUM: 3.3 mmol/L — AB (ref 3.5–5.2)
SODIUM: 141 mmol/L (ref 134–144)

## 2014-05-10 MED ORDER — POTASSIUM CHLORIDE CRYS ER 20 MEQ PO TBCR: 20.0000 meq | EXTENDED_RELEASE_TABLET | Freq: Two times a day (BID) | ORAL | Status: DC

## 2014-05-10 NOTE — Telephone Encounter (Signed)
Spoke with patient's son, discussed lab results. Patient has an appointment Monday for a mammogram. Rachael Perez questions if it is ok to wait until the 29th (pending appointment with Dr.Reed) to recheck potassium level

## 2014-05-10 NOTE — Telephone Encounter (Signed)
-----   Message from [facility] [place] sent at 05/10/2014 10:31 AM EST ----- Potassium is low- Rx K-dur 20Meq #4 take 1 tab po BID x 2 days. No RF. Repeat BMP on Jan 25th

## 2014-05-14 DIAGNOSIS — Z853 Personal history of malignant neoplasm of breast: Secondary | ICD-10-CM | POA: Diagnosis not present

## 2014-05-14 DIAGNOSIS — N644 Mastodynia: Secondary | ICD-10-CM | POA: Diagnosis not present

## 2014-05-15 ENCOUNTER — Telehealth: Payer: Self-pay | Admitting: *Deleted

## 2014-05-15 NOTE — Telephone Encounter (Signed)
Son called and spoke with Isa Rankin and wanted patient's medication list emailed to him at [email or web address]. I tried calling him back and left message that I couldn't email it but if he wanted it mailed to call me back.

## 2014-05-17 ENCOUNTER — Telehealth: Payer: Self-pay | Admitting: *Deleted

## 2014-05-17 NOTE — Telephone Encounter (Signed)
Patient son, Dr. Elvin So wants you to return his call regarding his mother. #:  [phone]

## 2014-05-18 ENCOUNTER — Other Ambulatory Visit: Payer: Self-pay | Admitting: *Deleted

## 2014-05-18 ENCOUNTER — Ambulatory Visit (INDEPENDENT_AMBULATORY_CARE_PROVIDER_SITE_OTHER): Payer: Medicare Other | Admitting: Internal Medicine

## 2014-05-18 ENCOUNTER — Encounter: Payer: Self-pay | Admitting: Internal Medicine

## 2014-05-18 VITALS — BP 130/60 | HR 89 | Temp 97.9°F | Resp 20 | Ht 67.0 in | Wt 101.0 lb

## 2014-05-18 DIAGNOSIS — F418 Other specified anxiety disorders: Secondary | ICD-10-CM | POA: Diagnosis not present

## 2014-05-18 DIAGNOSIS — N644 Mastodynia: Secondary | ICD-10-CM

## 2014-05-18 DIAGNOSIS — F0390 Unspecified dementia without behavioral disturbance: Secondary | ICD-10-CM

## 2014-05-18 DIAGNOSIS — M810 Age-related osteoporosis without current pathological fracture: Secondary | ICD-10-CM

## 2014-05-18 DIAGNOSIS — E876 Hypokalemia: Secondary | ICD-10-CM

## 2014-05-18 DIAGNOSIS — R1310 Dysphagia, unspecified: Secondary | ICD-10-CM

## 2014-05-18 MED ORDER — MEMANTINE HCL ER 28 MG PO CP24: 28.0000 mg | ORAL_CAPSULE | Freq: Every day | ORAL | Status: DC

## 2014-05-18 MED ORDER — ESCITALOPRAM OXALATE 5 MG/5ML PO SOLN: 10.0000 mg | Freq: Every day | ORAL | Status: DC

## 2014-05-18 MED ORDER — POTASSIUM CHLORIDE 20 MEQ/15ML (10%) PO SOLN: 20.0000 meq | Freq: Every day | ORAL | Status: DC

## 2014-05-18 NOTE — Telephone Encounter (Signed)
Truman Hayward, grandaughter called and stated that she needs a Rx to mail in to receive patient assistance for Namenda. Printed and given to Dr. Mariea Clonts to sign. Will pick up Monday.

## 2014-05-18 NOTE — Progress Notes (Signed)
Patient ID: Rachael Perez, female   DOB: Sep 14, 1926, 81 y.o.   MRN: [id number]   Location:  [facility]  Code Status: full code  Allergies  Allergen Reactions  . Codeine Shortness Of Breath  . Lodine [Etodolac]     Chief Complaint  Patient presents with  . Medical Management of Chronic Issues    HPI: Patient is a 81 y.o. white female seen in the office today for med mgt chronic diseases. I received a message to call her son.  She is here with her local son's wife.    Right ear hearing aide is out.    Has been c/o pain in her left side.  Says it hurts on both sides after lamp fell on her.    Not on namenda XR due to cost--$400.   Says she tries to eat, but weight does not stay on.  Losing concept of time and not eating properly.  Says she eats raisin bran with milk.  Eats toast, drinks milk, eats scrambled eggs.  Loves chocolates.  Does eat when over at her children's house.  Sometimes will pick though.    Says she has too much stuff.  Thinking of getting smaller bed.    Discussed eating before doing her shot of bailey's.  Do not do more than one either.    Someone is coming twice a day to give her pills.  Pt says she has been taking them herself.  Large pill got stuck this am.  Has not happened before but does use syrup to help them go down.    Review of Systems:  Review of Systems  Constitutional: Positive for weight loss. Negative for fever and malaise/fatigue.  HENT: Positive for hearing loss. Negative for congestion.        Some dysphagia of large pills  Eyes: Negative for blurred vision.  Respiratory: Negative for shortness of breath.   Cardiovascular: Positive for chest pain.       Over bilateral ribs and breasts (had XRT)  Gastrointestinal: Negative for abdominal pain, constipation, blood in stool and melena.  Genitourinary: Positive for urgency. Negative for dysuria.  Musculoskeletal: Negative for falls.  Skin:  Negative for rash.  Neurological: Negative for dizziness, loss of consciousness and weakness.       Poor balance  Psychiatric/Behavioral: Positive for memory loss. Negative for depression. The patient is nervous/anxious. The patient does not have insomnia.      Past Medical History  Diagnosis Date  . Hypertension   . Palpitations   . SOB (shortness of breath) on exertion   . History of medication noncompliance   . Varicose veins   . Wears hearing aid   . Abnormal chest CT     Lesion is unchanged; Followed by Dr. Benay Spice  . Senile osteoporosis   . Anxiety   . Breast cancer 08/1994    Remote lumpectomy on L  . Breast cancer 05/2011    R breast with lumpectomy and XRT therapy  . Arthritis   . Vitamin D deficiency   . Other B-complex deficiencies   . Dementia in conditions classified elsewhere without behavioral disturbance   . Malignant neoplasm of breast (female), unspecified site   . Chronic pain   . Varicose veins of lower extremities with inflammation   . Hallucinations   . Insomnia, unspecified     Past Surgical History  Procedure Laterality Date  . Tonsillectomy    . Tubal ligation  1980  .  Breast lumpectomy  09/01/2011  . Cataract extraction, bilateral    . US echocardiography  12/17/2009    EF 55-60%  . Breast lumpectomy  1996    left breast  . Varicose vein removal  1970    Social History:   reports that she quit smoking about 62 years ago. Her smoking use included Cigarettes. She has never used smokeless tobacco. She reports that she drinks alcohol. She reports that she does not use illicit drugs.  Family History  Problem Relation Age of Onset  . Heart disease Mother   . Heart disease Father   . Heart disease Sister   . Heart disease Brother   . Arthritis Brother   . Heart disease Sister   . Alzheimer's disease Sister   . Cancer Neg Hx     Medications: Patient's Medications  New Prescriptions   No medications on file  Previous Medications    AMLODIPINE (NORVASC) 10 MG TABLET    Take 1 tablet (10 mg total) by mouth daily.   ASPIRIN 325 MG TABLET    Take 1 tablet (325 mg total) by mouth daily.   HYDROCHLOROTHIAZIDE (HYDRODIURIL) 25 MG TABLET    Take 1 tablet (25 mg total) by mouth daily.   MELATONIN PO    Take by mouth at bedtime.   MEMANTINE (NAMENDA XR) 28 MG CP24 24 HR CAPSULE    Take 1 capsule (28 mg total) by mouth daily.   POTASSIUM CHLORIDE SA (K-DUR,KLOR-CON) 20 MEQ TABLET    Take 1 tablet (20 mEq total) by mouth 2 (two) times daily.  Modified Medications   No medications on file  Discontinued Medications   No medications on file     Physical Exam: Filed Vitals:   05/18/14 1044  BP: 130/60  Pulse: 89  Temp: 97.9 F (36.6 C)  TempSrc: Oral  Resp: 20  Height: 5\' 7"  (1.702 m)  Weight: 101 lb (45.813 kg)  SpO2: 97%  Physical Exam  Constitutional: No distress.  Increasingly frail female  Cardiovascular: Normal rate, regular rhythm, normal heart sounds and intact distal pulses.   Pulmonary/Chest: Effort normal and breath sounds normal. No respiratory distress.  Tenderness over bilateral ribs around midaxillary line intercostally  Abdominal: Soft. Bowel sounds are normal. She exhibits no distension and no mass. There is no tenderness.  Musculoskeletal: Normal range of motion.  Unsteady gait  Neurological: She is alert.  Oriented to person and place, not precise date, is very anxious today and easily upset, her daughter in law is here with her  Skin: Skin is warm and dry.     Labs reviewed: Basic Metabolic Panel:  Recent Labs  01/18/14 1417 01/30/14 1420 03/29/14 0935 05/09/14 1055  NA 138 135* 140 141  K 4.3 4.0 3.8 3.3*  CL 98 94* 96* 90*  CO2 26 27 27 28   GLUCOSE 96 95 102* 79  BUN 20 21 31* 29*  CREATININE 0.81 0.79 0.86 0.85  CALCIUM 9.6 9.4 9.9 9.6  TSH 0.842  --   --   --    Liver Function Tests:  Recent Labs  01/18/14 1417 01/30/14 1420  AST 23 27  ALT 11 15  ALKPHOS 64 56    BILITOT 0.6 1.0  PROT 6.6 7.2  ALBUMIN  --  3.9    Recent Labs  01/30/14 1420  LIPASE 66*   No results for input(s): AMMONIA in the last 8760 hours. CBC:  Recent Labs  01/18/14 1417 01/30/14 1225  WBC 6.1  6.9  NEUTROABS 4.4 5.1  HGB 13.3 14.3  HCT 39.8 41.3  MCV 93 90.6  PLT 214 247   Lipid Panel:  Recent Labs  01/31/14 0605  CHOL 182  HDL 71  LDLCALC 97  TRIG 70  CHOLHDL 2.6   Lab Results  Component Value Date   HGBA1C 5.8* 01/31/2014    Assessment/Plan 1. Hypokalemia -was supposed to take potassium for only 6 pills, but she has continued it and level wnl -will change to potassium liquid instead - potassium chloride 20 MEQ/15ML (10%) SOLN; Take 15 mLs (20 mEq total) by mouth daily.  Dispense: 450 mL; Refill: 3 - Basic metabolic panel  2. Dementia, without behavioral disturbance -is progressive -namenda XR is expensive--working on trying to get family to pay for due to importance at this point  3. Mixed anxiety depressive disorder - did not tolerate brintellix previously -will try her on lexapro since it comes in a liquid to see if it helps her severe anxiety - escitalopram (LEXAPRO) 5 MG/5ML solution; Take 10 mLs (10 mg total) by mouth daily.  Dispense: 240 mL; Refill: 12  4. Senile osteoporosis -will need to review AGAIN with family present that she needs to be taking vitamin D 2000 units daily for this  5. Pain of both breasts -had XRT and some of this pain seems related to that -f/us with oncology have been normal as have mammos  6. Pill dysphagia -changed larger pills to liquid  Labs/tests ordered: f/u bmp Next appt:  6 wks for memory and mood  Murrel Bertram L. Phillipe Clemon, D.O. [facility] [address], [place] [address] Cell Phone (Mon-Fri 8am-5pm):  [phone] On Call:  [phone] & follow prompts after 5pm & weekends Office Phone:  [phone] Office Fax:  [phone]

## 2014-05-19 LAB — BASIC METABOLIC PANEL
BUN/Creatinine Ratio: 32 — ABNORMAL HIGH (ref 11–26)
BUN: 23 mg/dL (ref 8–27)
CO2: 29 mmol/L (ref 18–29)
Calcium: 9 mg/dL (ref 8.7–10.3)
Chloride: 94 mmol/L — ABNORMAL LOW (ref 97–108)
Creatinine, Ser: 0.73 mg/dL (ref 0.57–1.00)
GFR calc Af Amer: 87 mL/min/{1.73_m2} (ref >59)
GFR calc non Af Amer: 75 mL/min/{1.73_m2} (ref >59)
Glucose: 87 mg/dL (ref 65–99)
Potassium: 4.4 mmol/L (ref 3.5–5.2)
Sodium: 138 mmol/L (ref 134–144)

## 2014-05-21 ENCOUNTER — Other Ambulatory Visit: Payer: Self-pay | Admitting: *Deleted

## 2014-05-21 MED ORDER — HYDROCHLOROTHIAZIDE 25 MG PO TABS: ORAL_TABLET | ORAL | Status: DC

## 2014-05-21 NOTE — Telephone Encounter (Signed)
[facility]  

## 2014-05-22 ENCOUNTER — Other Ambulatory Visit: Payer: Self-pay

## 2014-05-22 DIAGNOSIS — E876 Hypokalemia: Secondary | ICD-10-CM

## 2014-05-24 ENCOUNTER — Encounter: Payer: Self-pay | Admitting: Internal Medicine

## 2014-05-24 ENCOUNTER — Telehealth: Payer: Self-pay | Admitting: *Deleted

## 2014-05-24 NOTE — Progress Notes (Signed)
Patient ID: Rachael Perez, female   DOB: [birth year], 81 y.o.   MRN: [id number] Also, of note, Rachael Perez dementia has gotten significantly worse lately.  She has not been getting her namenda XR due to cost but new Rx was faxed.  She is said to have someone giving her meds twice a day.  She says this isn't happening but is not a reliable historian.  Her two sons have not been communicating amongst themselves.  I have communicated all of this information with pt's local son, Rachael Perez, wife who accompanied her to the visit.

## 2014-05-24 NOTE — Telephone Encounter (Signed)
Debbie, Nurse at [facility] called and stated that Patient is not doing well. Stated that the son has been trying to contact you. Nurse stated that patient is dizzy, weak, Fatigue and possible dehydrated and elevated heart rate. Not much of an appitite. Has really gone down hill the last couple of weeks. Complaining of mouth being dry and weak is why she thinks its dehydration. Patient is refusing to go to hospital. Please Advise.

## 2014-05-24 NOTE — Telephone Encounter (Signed)
Rachael Perez agreed with instruction and will talk with son and patient and will take patient to hospital.

## 2014-05-24 NOTE — Telephone Encounter (Signed)
Please call Debbie back today:  I recommend she does go to the hospital for possible dehydration.  This is the first message I have gotten since before her appointment Monday.  She was seen in the office on Monday and her daughter in law was here with her and all information was given to her.  She was also started on lexapro liquid and potassium liquid at the appt due to some difficulty swallowing.  She needs her bmp repeated and could also be hyperkalemic due to the potassium supplementation.  Her dementia and anxiety have worsened.  I am told she is getting help from a caregiver to get her medicines.

## 2014-05-24 NOTE — Telephone Encounter (Signed)
Patient son, Dr. Elvin So called again wanting you to return his call regarding his mother. Please call # [phone]

## 2014-05-24 NOTE — Telephone Encounter (Signed)
Spoke with Dr. Baldo Daub about his mom.  Discussed that I recommend a higher level of supervision for her at [facility] so we can know she is safe.  We also discussed the med changes from the last visit and that I referred her to the ED today for possible dehydration and hyperkalemia that could be causing her dizziness.  She has refused to go.

## 2014-05-25 ENCOUNTER — Telehealth: Payer: Self-pay

## 2014-05-25 MED ORDER — AMBULATORY NON FORMULARY MEDICATION: Status: DC

## 2014-05-25 MED ORDER — AMBULATORY NON FORMULARY MEDICATION
Status: DC
Start: 2014-05-25 — End: 2014-06-28

## 2014-05-25 NOTE — Telephone Encounter (Signed)
Patient's son called requesting labs orders to check electrolytes, questions if mother is dehydrated.   Per Dr.Reed order CMP and CBCD, DX: E86.0 (dehydration)  Faxed lab order to [facility] [phone]) Attention [facility]

## 2014-05-29 ENCOUNTER — Encounter (HOSPITAL_COMMUNITY): Payer: Self-pay

## 2014-05-29 ENCOUNTER — Other Ambulatory Visit: Payer: Medicare Other

## 2014-05-29 ENCOUNTER — Telehealth: Payer: Self-pay | Admitting: *Deleted

## 2014-05-29 ENCOUNTER — Emergency Department (HOSPITAL_COMMUNITY): Payer: Medicare Other

## 2014-05-29 ENCOUNTER — Ambulatory Visit: Payer: Medicare Other | Admitting: Internal Medicine

## 2014-05-29 ENCOUNTER — Emergency Department (HOSPITAL_COMMUNITY)
Admission: EM | Admit: 2014-05-29 | Discharge: 2014-05-29 | Disposition: A | Discharge status: Home / Self Care | Payer: Medicare Other | Attending: Emergency Medicine | Admitting: Emergency Medicine

## 2014-05-29 DIAGNOSIS — Z7982 Long term (current) use of aspirin: Secondary | ICD-10-CM | POA: Diagnosis not present

## 2014-05-29 DIAGNOSIS — J984 Other disorders of lung: Secondary | ICD-10-CM | POA: Diagnosis not present

## 2014-05-29 DIAGNOSIS — R627 Adult failure to thrive: Secondary | ICD-10-CM

## 2014-05-29 DIAGNOSIS — Z853 Personal history of malignant neoplasm of breast: Secondary | ICD-10-CM | POA: Diagnosis not present

## 2014-05-29 DIAGNOSIS — G8929 Other chronic pain: Secondary | ICD-10-CM | POA: Insufficient documentation

## 2014-05-29 DIAGNOSIS — Z79899 Other long term (current) drug therapy: Secondary | ICD-10-CM | POA: Diagnosis not present

## 2014-05-29 DIAGNOSIS — E869 Volume depletion, unspecified: Secondary | ICD-10-CM | POA: Diagnosis not present

## 2014-05-29 DIAGNOSIS — I10 Essential (primary) hypertension: Secondary | ICD-10-CM | POA: Diagnosis not present

## 2014-05-29 DIAGNOSIS — F0390 Unspecified dementia without behavioral disturbance: Secondary | ICD-10-CM | POA: Insufficient documentation

## 2014-05-29 DIAGNOSIS — M1990 Unspecified osteoarthritis, unspecified site: Secondary | ICD-10-CM | POA: Insufficient documentation

## 2014-05-29 DIAGNOSIS — F419 Anxiety disorder, unspecified: Secondary | ICD-10-CM | POA: Insufficient documentation

## 2014-05-29 DIAGNOSIS — Z87891 Personal history of nicotine dependence: Secondary | ICD-10-CM | POA: Insufficient documentation

## 2014-05-29 DIAGNOSIS — R531 Weakness: Secondary | ICD-10-CM

## 2014-05-29 LAB — URINE MICROSCOPIC-ADD ON

## 2014-05-29 LAB — CBC
HCT: 41.1 % (ref 36.0–46.0)
Hemoglobin: 13.8 g/dL (ref 12.0–15.0)
MCH: 31.4 pg (ref 26.0–34.0)
MCHC: 33.6 g/dL (ref 30.0–36.0)
MCV: 93.4 fL (ref 78.0–100.0)
PLATELETS: 331 10*3/uL (ref 150–400)
RBC: 4.4 MIL/uL (ref 3.87–5.11)
RDW: 13 % (ref 11.5–15.5)
WBC: 15.6 10*3/uL — AB (ref 4.0–10.5)

## 2014-05-29 LAB — URINALYSIS, ROUTINE W REFLEX MICROSCOPIC
Bilirubin Urine: NEGATIVE
GLUCOSE, UA: NEGATIVE mg/dL
Hgb urine dipstick: NEGATIVE
Ketones, ur: NEGATIVE mg/dL
NITRITE: NEGATIVE
PH: 6 (ref 5.0–8.0)
Protein, ur: 100 mg/dL — AB
Specific Gravity, Urine: 1.014 (ref 1.005–1.030)
Urobilinogen, UA: 1 mg/dL (ref 0.0–1.0)

## 2014-05-29 LAB — COMPREHENSIVE METABOLIC PANEL
ALBUMIN: 3.3 g/dL — AB (ref 3.5–5.2)
ALK PHOS: 76 U/L (ref 39–117)
ALT: 27 U/L (ref 0–35)
AST: 28 U/L (ref 0–37)
Anion gap: 13 (ref 5–15)
BUN: 38 mg/dL — ABNORMAL HIGH (ref 6–23)
CHLORIDE: 94 mmol/L — AB (ref 96–112)
CO2: 31 mmol/L (ref 19–32)
Calcium: 9.9 mg/dL (ref 8.4–10.5)
Creatinine, Ser: 0.89 mg/dL (ref 0.50–1.10)
GFR calc Af Amer: 67 mL/min — ABNORMAL LOW (ref >90)
GFR calc non Af Amer: 57 mL/min — ABNORMAL LOW (ref >90)
Glucose, Bld: 125 mg/dL — ABNORMAL HIGH (ref 70–99)
POTASSIUM: 3.2 mmol/L — AB (ref 3.5–5.1)
SODIUM: 138 mmol/L (ref 135–145)
Total Bilirubin: 0.8 mg/dL (ref 0.3–1.2)
Total Protein: 7.9 g/dL (ref 6.0–8.3)

## 2014-05-29 MED ORDER — SODIUM CHLORIDE 0.9 % IV SOLN
1000.0000 mL | INTRAVENOUS | Status: DC
  Administered 2014-05-29: 1000 mL via INTRAVENOUS

## 2014-05-29 MED ORDER — SODIUM CHLORIDE 0.9 % IV SOLN
1000.0000 mL | Freq: Once | INTRAVENOUS | Status: AC
  Administered 2014-05-29: 1000 mL via INTRAVENOUS

## 2014-05-29 NOTE — ED Notes (Signed)
Placed patient on bed pan , collected urine sample, removed bed pan.

## 2014-05-29 NOTE — ED Provider Notes (Signed)
CSN: [id number]     Arrival date & time 05/29/14  1321 History   First MD Initiated Contact with Patient 05/29/14 1656     Chief Complaint  Patient presents with  . Failure To Thrive     Level V caveat: Memory loss  HPI Patient is brought to the emergency department from [place] home with reported decreased oral intake over the past several days and generalized weakness.  Son reports mild generalized weakness.  He does feel like his mother might be slightly confused although not far off her baseline.  She does have early dementia.  She was recently started on Namenda.  No reports of vomiting or diarrhea.  She was able to get herself dressed and put makeup on today as well as do her hair.  No reports of fever.  She is frail in appearance and weighs 101 pounds. he states this is normal for her.  No other recent medication changes.   Past Medical History  Diagnosis Date  . Hypertension   . Palpitations   . SOB (shortness of breath) on exertion   . History of medication noncompliance   . Varicose veins   . Wears hearing aid   . Abnormal chest CT     Lesion is unchanged; Followed by Dr. Benay Spice  . Senile osteoporosis   . Anxiety   . Breast cancer 08/1994    Remote lumpectomy on L  . Breast cancer 05/2011    R breast with lumpectomy and XRT therapy  . Arthritis   . Vitamin D deficiency   . Other B-complex deficiencies   . Dementia in conditions classified elsewhere without behavioral disturbance   . Malignant neoplasm of breast (female), unspecified site   . Chronic pain   . Varicose veins of lower extremities with inflammation   . Hallucinations   . Insomnia, unspecified    Past Surgical History  Procedure Laterality Date  . Tonsillectomy    . Tubal ligation  1980  . Breast lumpectomy  09/01/2011  . Cataract extraction, bilateral    . US echocardiography  12/17/2009    EF 55-60%  . Breast lumpectomy  1996    left breast  . Varicose vein removal  1970   Family  History  Problem Relation Age of Onset  . Heart disease Mother   . Heart disease Father   . Heart disease Sister   . Heart disease Brother   . Arthritis Brother   . Heart disease Sister   . Alzheimer's disease Sister   . Cancer Neg Hx    History  Substance Use Topics  . Smoking status: Former Smoker    Types: Cigarettes    Quit date: 04/20/1952  . Smokeless tobacco: Never Used  . Alcohol Use: Yes     Comment: "I occasionally drink wine."   OB History    No data available     Review of Systems  Unable to perform ROS     Allergies  Codeine and Lodine  Home Medications   Prior to Admission medications   Medication Sig Start Date End Date Taking? Authorizing Provider  AMBULATORY NON FORMULARY MEDICATION Lab work order: CMP CBCD DX: E86.0 (dehydration) 05/25/14   Tiffany L Reed, DO  amLODipine (NORVASC) 10 MG tablet Take 1 tablet (10 mg total) by mouth daily. 02/02/14   Kelvin Cellar, MD  aspirin 325 MG tablet Take 1 tablet (325 mg total) by mouth daily. 02/01/14   Kelvin Cellar, MD  escitalopram (LEXAPRO) 5  MG/5ML solution Take 10 mLs (10 mg total) by mouth daily. 05/18/14   Tiffany L Reed, DO  hydrochlorothiazide (HYDRODIURIL) 25 MG tablet Take one tablet by mouth once daily for blood pressure 05/21/14   Tiffany L Reed, DO  MELATONIN PO Take by mouth at bedtime.    Historical Provider, MD  memantine (NAMENDA XR) 28 MG CP24 24 hr capsule Take 1 capsule (28 mg total) by mouth daily. To preserve memory 05/18/14   Tiffany L Reed, DO  potassium chloride 20 MEQ/15ML (10%) SOLN Take 15 mLs (20 mEq total) by mouth daily. 05/18/14   Tiffany L Reed, DO   BP 168/72 mmHg  Pulse 96  Temp(Src) 97.5 F (36.4 C) (Oral)  Resp 18  SpO2 95% Physical Exam  Constitutional: She appears well-developed and well-nourished. No distress.  HENT:  Head: Normocephalic and atraumatic.  Eyes: EOM are normal. Pupils are equal, round, and reactive to light.  Neck: Normal range of motion.   Cardiovascular: Normal rate, regular rhythm and normal heart sounds.   Pulmonary/Chest: Effort normal and breath sounds normal.  Abdominal: Soft. She exhibits no distension. There is no tenderness.  Musculoskeletal: Normal range of motion.  Neurological: She is alert.  5/5 strength in major muscle groups of  bilateral upper and lower extremities. Speech normal. No facial asymetry.  Oriented 2  Skin: Skin is warm and dry.  Psychiatric: She has a normal mood and affect. Judgment normal.  Nursing note and vitals reviewed.   ED Course  Procedures (including critical care time) Labs Review Labs Reviewed  CBC - Abnormal; Notable for the following:    WBC 15.6 (*)    All other components within normal limits  COMPREHENSIVE METABOLIC PANEL - Abnormal; Notable for the following:    Potassium 3.2 (*)    Chloride 94 (*)    Glucose, Bld 125 (*)    BUN 38 (*)    Albumin 3.3 (*)    GFR calc non Af Amer 57 (*)    GFR calc Af Amer 67 (*)    All other components within normal limits  URINALYSIS, ROUTINE W REFLEX MICROSCOPIC - Abnormal; Notable for the following:    Protein, ur 100 (*)    Leukocytes, UA SMALL (*)    All other components within normal limits  URINE MICROSCOPIC-ADD ON - Abnormal; Notable for the following:    Bacteria, UA FEW (*)    All other components within normal limits  URINE CULTURE    Imaging Review Dg Chest 2 View  05/29/2014   CLINICAL DATA:  Failure to thrive.  Weakness.  EXAM: CHEST  2 VIEW  COMPARISON:  01/31/2014 and chest CT dated 06/01/2011  FINDINGS: Heart size and pulmonary vascularity are normal. Calcification in the thoracic aorta. Pectus excavatum deformity. Previous bilateral breast surgery.  There are is bilateral apical pleural thickening with parenchymal scarring in both lungs primarily in the upper lobes. There are no acute infiltrates or effusions. No acute osseous abnormality.  IMPRESSION: No acute abnormality.  Scarring in both lungs.   Electronically  Signed   By: Lorriane Shire M.D.   On: 05/29/2014 15:08     EKG Interpretation None      MDM   Final diagnoses:  Weakness  Volume depletion    Patient is overall well-appearing.  Urine and labs are without significant abnormality.  I attempted to perform a head CT however the patient refused.  My overall suspicion is that this will be low yield and I do  not think the patient needs to be sedated or restrained in order to perform the CT scan.  I think this would likely increase her risk of complications if we were to try and sedate.  I spoke with the son about this and he agrees.    Hoy Morn, MD 05/29/14 2021

## 2014-05-29 NOTE — ED Notes (Signed)
Pts son reports that  Blood was drawn at triage.  Triage doesn't have, lab doesn't have on hold.  Pts sons reports that nursing home told him that pt has not ate since yesterday am.  Pt appears to be weak to him.  Pt has no complaints.  States "I eat all the time, I eat chocolate, ensure".

## 2014-05-29 NOTE — Discharge Instructions (Signed)

## 2014-05-29 NOTE — ED Notes (Signed)
pts ear rings and broach in a denture cup, cup then placed in pts pocketbook per patients mission by tech, Junie Panning.

## 2014-05-29 NOTE — ED Notes (Signed)
Pt was brought back by CT, pt refused test.

## 2014-05-29 NOTE — Telephone Encounter (Signed)
Patient son brought patient in to do bloodwork and son stated that patient has not eaten in several days, since last week. Patient is very weak and fatigue and color is grayish. I advised son to take his mother to the ER like Dr. Mariea Clonts suggested last week to get IV Fluids for possible dehydration. Son agreed and taking her now.

## 2014-05-29 NOTE — ED Notes (Signed)
Pt brought in by son for increased lack of appetite. Pt currently lives at [facility] and per staff she has had decrease in appetite over last several weeks. Pt oriented to self, but hx of dementia. Pt follows commands but refusing EKG and blood work at this time. Pt denies pain.

## 2014-05-31 LAB — URINE CULTURE

## 2014-06-28 ENCOUNTER — Ambulatory Visit (INDEPENDENT_AMBULATORY_CARE_PROVIDER_SITE_OTHER): Payer: Medicare Other | Admitting: Internal Medicine

## 2014-06-28 ENCOUNTER — Encounter: Payer: Self-pay | Admitting: Internal Medicine

## 2014-06-28 VITALS — BP 120/66 | HR 77 | Temp 97.7°F | Ht 67.0 in | Wt 100.0 lb

## 2014-06-28 DIAGNOSIS — R634 Abnormal weight loss: Secondary | ICD-10-CM | POA: Diagnosis not present

## 2014-06-28 DIAGNOSIS — F0150 Vascular dementia without behavioral disturbance: Secondary | ICD-10-CM

## 2014-06-28 DIAGNOSIS — E876 Hypokalemia: Secondary | ICD-10-CM | POA: Diagnosis not present

## 2014-06-28 DIAGNOSIS — M810 Age-related osteoporosis without current pathological fracture: Secondary | ICD-10-CM

## 2014-06-28 DIAGNOSIS — F418 Other specified anxiety disorders: Secondary | ICD-10-CM

## 2014-06-28 DIAGNOSIS — F341 Dysthymic disorder: Secondary | ICD-10-CM | POA: Diagnosis not present

## 2014-06-28 MED ORDER — VITAMIN D3 50 MCG (2000 UT) PO CAPS: 2000.0000 [IU] | ORAL_CAPSULE | Freq: Every day | ORAL | Status: DC

## 2014-06-28 NOTE — Progress Notes (Signed)
Patient ID: Rachael Perez, female   DOB: [birth year], 81 y.o.   MRN: [id number]   Location:  [facility]  Code Status: full code; need to discuss at next appt  Allergies  Allergen Reactions  . Codeine Shortness Of Breath  . Lexapro [Escitalopram Oxalate] Other (See Comments)    "Wiped patient out"  . Lodine [Etodolac]   . Namenda [Memantine Hcl] Other (See Comments)    "Wiped patient out"    Chief Complaint  Patient presents with  . Medication Management    6 wk follow up, doing better since d/c'ing medications     HPI: Patient is a 81 y.o. white female seen in the office today for medical mgt of chronic diseases.  Is doing fine.  Lost weight, but appetite back.  Did not tolerate lexapro and namenda XR.  Had terrible nightmares.  Only taking her baby aspirin.  Loves living at [facility] on 4th floor with long hall.  If doesn't feel well, it's too long.  Mood is better, too.  Says she is tired of her family bothering her b/c she is fine.   Feels less unsteady.  Getting help with bathing and dressing 8-12 and they encourage eating.  Also come two other times a day to check on her. Has strawberry ensure that she likes.    C/o some neck pain posteriorly.   Review of Systems:  Review of Systems  Constitutional: Positive for weight loss. Negative for fever and chills.  HENT: Negative for congestion.   Eyes: Negative for blurred vision.  Respiratory: Negative for shortness of breath.   Cardiovascular: Negative for chest pain.  Gastrointestinal: Negative for abdominal pain and constipation.  Genitourinary: Negative for dysuria, urgency and frequency.  Musculoskeletal: Positive for neck pain. Negative for falls.  Neurological: Negative for dizziness and weakness.  Psychiatric/Behavioral: Positive for memory loss. Negative for depression. The patient is nervous/anxious and has insomnia.      Past Medical History  Diagnosis Date  .  Hypertension   . Palpitations   . SOB (shortness of breath) on exertion   . History of medication noncompliance   . Varicose veins   . Wears hearing aid   . Abnormal chest CT     Lesion is unchanged; Followed by Dr. Benay Spice  . Senile osteoporosis   . Anxiety   . Breast cancer 08/1994    Remote lumpectomy on L  . Breast cancer 05/2011    R breast with lumpectomy and XRT therapy  . Arthritis   . Vitamin D deficiency   . Other B-complex deficiencies   . Dementia in conditions classified elsewhere without behavioral disturbance   . Malignant neoplasm of breast (female), unspecified site   . Chronic pain   . Varicose veins of lower extremities with inflammation   . Hallucinations   . Insomnia, unspecified     Past Surgical History  Procedure Laterality Date  . Tonsillectomy    . Tubal ligation  1980  . Breast lumpectomy  09/01/2011  . Cataract extraction, bilateral    . US echocardiography  12/17/2009    EF 55-60%  . Breast lumpectomy  1996    left breast  . Varicose vein removal  1970    Social History:   reports that she quit smoking about 62 years ago. Her smoking use included Cigarettes. She has never used smokeless tobacco. She reports that she drinks alcohol. She reports that she does not use illicit drugs.  Family History  Problem Relation Age of Onset  . Heart disease Mother   . Heart disease Father   . Heart disease Sister   . Heart disease Brother   . Arthritis Brother   . Heart disease Sister   . Alzheimer's disease Sister   . Cancer Neg Hx     Medications: Patient's Medications  New Prescriptions   No medications on file  Previous Medications   ASPIRIN 325 MG TABLET    Take 1 tablet (325 mg total) by mouth daily.  Modified Medications   No medications on file  Discontinued Medications   AMBULATORY NON FORMULARY MEDICATION    Lab work order: CMP CBCD DX: E86.0 (dehydration)   AMLODIPINE (NORVASC) 10 MG TABLET    Take 1 tablet (10 mg total) by mouth  daily.   ESCITALOPRAM (LEXAPRO) 5 MG/5ML SOLUTION    Take 10 mLs (10 mg total) by mouth daily.   HYDROCHLOROTHIAZIDE (HYDRODIURIL) 25 MG TABLET    Take one tablet by mouth once daily for blood pressure   MELATONIN PO    Take by mouth at bedtime.   MEMANTINE (NAMENDA XR) 28 MG CP24 24 HR CAPSULE    Take 1 capsule (28 mg total) by mouth daily. To preserve memory   POTASSIUM CHLORIDE 20 MEQ/15ML (10%) SOLN    Take 15 mLs (20 mEq total) by mouth daily.     Physical Exam: Filed Vitals:   06/28/14 1556  BP: 120/66  Pulse: 77  Temp: 97.7 F (36.5 C)  TempSrc: Oral  Height: 5\' 7"  (1.702 m)  Weight: 100 lb (45.36 kg)  SpO2: 97%  Physical Exam  Constitutional:  Frail white female  HENT:  Head: Normocephalic and atraumatic.  Cardiovascular: Normal rate, regular rhythm and normal heart sounds.   Pulmonary/Chest: Effort normal and breath sounds normal.  Abdominal: Soft. Bowel sounds are normal.  Musculoskeletal:  Right posterior neck paravertebral muscles tender  Neurological: She is alert.  Skin: Skin is warm and dry.  Psychiatric:  Anxious     Labs reviewed: Basic Metabolic Panel:  Recent Labs  01/18/14 1417  05/09/14 1055 05/18/14 1150 05/29/14 1800  NA 138  < > 141 138 138  K 4.3  < > 3.3* 4.4 3.2*  CL 98  < > 90* 94* 94*  CO2 26  < > 28 29 31   GLUCOSE 96  < > 79 87 125*  BUN 20  < > 29* 23 38*  CREATININE 0.81  < > 0.85 0.73 0.89  CALCIUM 9.6  < > 9.6 9.0 9.9  TSH 0.842  --   --   --   --   < > = values in this interval not displayed. Liver Function Tests:  Recent Labs  01/18/14 1417 01/30/14 1420 05/29/14 1800  AST 23 27 28   ALT 11 15 27   ALKPHOS 64 56 76  BILITOT 0.6 1.0 0.8  PROT 6.6 7.2 7.9  ALBUMIN  --  3.9 3.3*    Recent Labs  01/30/14 1420  LIPASE 66*   No results for input(s): AMMONIA in the last 8760 hours. CBC:  Recent Labs  01/18/14 1417 01/30/14 1225 05/29/14 1800  WBC 6.1 6.9 15.6*  NEUTROABS 4.4 5.1  --   HGB 13.3 14.3 13.8    HCT 39.8 41.3 41.1  MCV 93 90.6 93.4  PLT 214 247 331   Lipid Panel:  Recent Labs  01/31/14 0605  CHOL 182  HDL 71  LDLCALC 97  TRIG 70  CHOLHDL 2.6   Lab Results  Component Value Date   HGBA1C 5.8* 01/31/2014    Assessment/Plan 1. Mixed anxiety depressive disorder -improved--did not tolerate lexapro therapy -remains somewhat anxious but has also not tolerated brintellix - Comprehensive metabolic panel  2. Vascular dementia without behavioral disturbance - did not do well with namenda XR--nightmares, dizziness, severe confusion, weakness and loss of appetite - Comprehensive metabolic panel  3. Senile osteoporosis - due to difficulty with her jaw bones and family member who is dentist, she has opted against any bisphosphonate or any other treatment besides vitamin D--again advised to take this today - Cholecalciferol (VITAMIN D3) 2000 UNITS capsule; Take 1 capsule (2,000 Units total) by mouth daily.  Dispense: 30 capsule; Refill: 3 - Comprehensive metabolic panel  4. Hypokalemia -encouraged to eat bananas daily and rechecked today - Comprehensive metabolic panel  5. Loss of weight - cont supplemental ensure strawberry - Comprehensive metabolic panel - TSH  Labs/tests ordered:   Orders Placed This Encounter  Procedures  . Comprehensive metabolic panel  . TSH    Next appt:  2 mos  Jayma Volpi L. Janis Cuffe, D.O. [facility] [address], [place] [address] Cell Phone (Mon-Fri 8am-5pm):  [phone] On Call:  [phone] & follow prompts after 5pm & weekends Office Phone:  [phone] Office Fax:  [phone]

## 2014-06-29 LAB — COMPREHENSIVE METABOLIC PANEL
ALT: 11 IU/L (ref 0–32)
AST: 24 IU/L (ref 0–40)
Albumin/Globulin Ratio: 1.4 (ref 1.1–2.5)
Albumin: 4 g/dL (ref 3.5–4.7)
Alkaline Phosphatase: 105 IU/L (ref 39–117)
BUN/Creatinine Ratio: 39 — ABNORMAL HIGH (ref 11–26)
BUN: 26 mg/dL (ref 8–27)
Bilirubin Total: 0.4 mg/dL (ref 0.0–1.2)
CO2: 27 mmol/L (ref 18–29)
Calcium: 9.3 mg/dL (ref 8.7–10.3)
Chloride: 99 mmol/L (ref 97–108)
Creatinine, Ser: 0.66 mg/dL (ref 0.57–1.00)
GFR calc Af Amer: 93 mL/min/{1.73_m2} (ref >59)
GFR calc non Af Amer: 81 mL/min/{1.73_m2} (ref >59)
Globulin, Total: 2.8 g/dL (ref 1.5–4.5)
Glucose: 94 mg/dL (ref 65–99)
Potassium: 4.5 mmol/L (ref 3.5–5.2)
Sodium: 140 mmol/L (ref 134–144)
Total Protein: 6.8 g/dL (ref 6.0–8.5)

## 2014-06-29 LAB — TSH: TSH: 1.8 u[IU]/mL (ref 0.450–4.500)

## 2014-08-23 ENCOUNTER — Encounter: Payer: Self-pay | Admitting: Internal Medicine

## 2014-08-23 ENCOUNTER — Ambulatory Visit (INDEPENDENT_AMBULATORY_CARE_PROVIDER_SITE_OTHER): Payer: Medicare Other | Admitting: Internal Medicine

## 2014-08-23 VITALS — BP 164/80 | HR 83 | Temp 97.6°F | Resp 18 | Ht 67.0 in | Wt 107.0 lb

## 2014-08-23 DIAGNOSIS — F411 Generalized anxiety disorder: Secondary | ICD-10-CM | POA: Diagnosis not present

## 2014-08-23 DIAGNOSIS — F0150 Vascular dementia without behavioral disturbance: Secondary | ICD-10-CM

## 2014-08-23 DIAGNOSIS — I499 Cardiac arrhythmia, unspecified: Secondary | ICD-10-CM | POA: Diagnosis not present

## 2014-08-23 DIAGNOSIS — R0600 Dyspnea, unspecified: Secondary | ICD-10-CM

## 2014-08-23 NOTE — Progress Notes (Signed)
Patient ID: BABS DABBS, female   DOB: Aug 09, 1926, 81 y.o.   MRN: [id number]   Location:  [facility]  Goals of Care: Advanced Directive information Does patient have an advance directive?: Yes, Type of Advance Directive: [place];Living will, Does patient want to make changes to advanced directive?: No - Patient declined   Allergies  Allergen Reactions  . Codeine Shortness Of Breath  . Lexapro [Escitalopram Oxalate] Other (See Comments)    "Wiped patient out"  . Lodine [Etodolac]   . Namenda [Memantine Hcl] Other (See Comments)    "Wiped patient out"    Chief Complaint  Patient presents with  . Acute Visit     Patient c/o waking up with breathing problems    HPI: Patient is a 81 y.o. white female seen in the office today for acute visit due to breathing problems.  She woke up Sunday night coughing.  Fell down on both knees in the middle of the night out of her bed when she turned over due to discomfort on her left side.  Is using her cane now.  Getting help with dressing, bathing, prepping breakfast now, and not taking any medications except asa and vit D.  Some of her clothes was not getting cleaned and her daughter in law washed them.   Has shallow breathing a lot.  Very painful when she pressed on her left side of ribs.  Will sound short of breath on phone and when walking in.  BP was high.  Has been frazzled and confused.    Ingrown nails are painful.  Had one removed.     Review of Systems:  Review of Systems  Constitutional: Positive for malaise/fatigue.  HENT: Negative for congestion.   Respiratory: Positive for cough and shortness of breath. Negative for hemoptysis, sputum production and wheezing.   Cardiovascular: Positive for palpitations. Negative for chest pain and leg swelling.  Gastrointestinal: Negative for abdominal pain and constipation.  Genitourinary: Negative for dysuria.    Musculoskeletal: Positive for falls. Negative for myalgias.  Neurological: Positive for weakness. Negative for dizziness.  Psychiatric/Behavioral: Positive for depression and memory loss. The patient is nervous/anxious and has insomnia.      Past Medical History  Diagnosis Date  . Hypertension   . Palpitations   . SOB (shortness of breath) on exertion   . History of medication noncompliance   . Varicose veins   . Wears hearing aid   . Abnormal chest CT     Lesion is unchanged; Followed by Dr. Benay Spice  . Senile osteoporosis   . Anxiety   . Breast cancer 08/1994    Remote lumpectomy on L  . Breast cancer 05/2011    R breast with lumpectomy and XRT therapy  . Arthritis   . Vitamin D deficiency   . Other B-complex deficiencies   . Dementia in conditions classified elsewhere without behavioral disturbance   . Malignant neoplasm of breast (female), unspecified site   . Chronic pain   . Varicose veins of lower extremities with inflammation   . Hallucinations   . Insomnia, unspecified     Past Surgical History  Procedure Laterality Date  . Tonsillectomy    . Tubal ligation  1980  . Breast lumpectomy  09/01/2011  . Cataract extraction, bilateral    . US echocardiography  12/17/2009    EF 55-60%  . Breast lumpectomy  1996    left breast  . Varicose vein removal  1970    Social History:   reports that she quit smoking about 62 years ago. Her smoking use included Cigarettes. She has never used smokeless tobacco. She reports that she drinks alcohol. She reports that she does not use illicit drugs.  Family History  Problem Relation Age of Onset  . Heart disease Mother   . Heart disease Father   . Heart disease Sister   . Heart disease Brother   . Arthritis Brother   . Heart disease Sister   . Alzheimer's disease Sister   . Cancer Neg Hx     Medications: Patient's Medications  New Prescriptions   No medications on file  Previous Medications   ASPIRIN 325 MG TABLET     Take 1 tablet (325 mg total) by mouth daily.   CHOLECALCIFEROL (VITAMIN D3) 2000 UNITS CAPSULE    Take 1 capsule (2,000 Units total) by mouth daily.  Modified Medications   No medications on file  Discontinued Medications   No medications on file     Physical Exam: Filed Vitals:   08/23/14 1445  BP: 164/80  Pulse: 83  Temp: 97.6 F (36.4 C)  TempSrc: Oral  Resp: 18  Height: 5\' 7"  (1.702 m)  Weight: 107 lb (48.535 kg)  SpO2: 99%  Physical Exam  Constitutional:  Frail white female, now walking with cane, is unsteady, walks in short steps, shuffles  Cardiovascular:  No murmur heard. irreg irreg  Pulmonary/Chest: Effort normal and breath sounds normal. No respiratory distress.  Musculoskeletal: She exhibits tenderness.  Of left lateral ribs midaxillary line and left upper outer chest  Neurological: She is alert.  Oriented to person and place  Psychiatric:  anxious     Labs reviewed: Basic Metabolic Panel:  Recent Labs  01/18/14 1417  05/18/14 1150 05/29/14 1800 06/28/14 1640  NA 138  < > 138 138 140  K 4.3  < > 4.4 3.2* 4.5  CL 98  < > 94* 94* 99  CO2 26  < > 29 31 27   GLUCOSE 96  < > 87 125* 94  BUN 20  < > 23 38* 26  CREATININE 0.81  < > 0.73 0.89 0.66  CALCIUM 9.6  < > 9.0 9.9 9.3  TSH 0.842  --   --   --  1.800  < > = values in this interval not displayed. Liver Function Tests:  Recent Labs  01/30/14 1420 05/29/14 1800 06/28/14 1640  AST 27 28 24   ALT 15 27 11   ALKPHOS 56 76 105  BILITOT 1.0 0.8 0.4  PROT 7.2 7.9 6.8  ALBUMIN 3.9 3.3*  --     Recent Labs  01/30/14 1420  LIPASE 66*   No results for input(s): AMMONIA in the last 8760 hours. CBC:  Recent Labs  01/18/14 1417 01/30/14 1225 05/29/14 1800  WBC 6.1 6.9 15.6*  NEUTROABS 4.4 5.1  --   HGB 13.3 14.3 13.8  HCT 39.8 41.3 41.1  MCV 93 90.6 93.4  PLT 214 247 331   Lipid Panel:  Recent Labs  01/31/14 0605  CHOL 182  HDL 71  LDLCALC 97  TRIG 70  CHOLHDL 2.6   Lab  Results  Component Value Date   HGBA1C 5.8* 01/31/2014   Assessment/Plan 1. Generalized anxiety disorder - she and family do not want meds for this due to her many side effects she's had with meds in the past  2. Vascular dementia without behavioral disturbance - is progressing gradually -bp was  high today and she's very anxious  3. Dyspnea -I think this is anxiety related also -has some left side reproducible discomfort in areas where she has her breast surgery and radiation  4. Irregular cardiac rhythm -must take aspirin daily -was in sinus when EKG done a few mins later--may have been afib when I was listening or pvcs  Labs/tests ordered: cbc, bmp Next appt:  Reschedule upcoming appt to 2 mos  Jomayra Novitsky L. Janecia Palau, D.O. [facility] [address], [place] [address] Cell Phone (Mon-Fri 8am-5pm):  [phone] On Call:  [phone] & follow prompts after 5pm & weekends Office Phone:  [phone] Office Fax:  [phone]

## 2014-09-03 ENCOUNTER — Ambulatory Visit: Payer: Medicare Other | Admitting: Internal Medicine

## 2014-10-16 ENCOUNTER — Ambulatory Visit
Admission: RE | Admit: 2014-10-16 | Discharge: 2014-10-16 | Disposition: A | Discharge status: Home / Self Care | Payer: Medicare Other | Source: Ambulatory Visit | Attending: Nurse Practitioner | Admitting: Nurse Practitioner

## 2014-10-16 ENCOUNTER — Encounter: Payer: Self-pay | Admitting: Nurse Practitioner

## 2014-10-16 ENCOUNTER — Ambulatory Visit (INDEPENDENT_AMBULATORY_CARE_PROVIDER_SITE_OTHER): Payer: Medicare Other | Admitting: Nurse Practitioner

## 2014-10-16 VITALS — BP 120/78 | HR 90 | Temp 97.6°F | Resp 18 | Ht 67.0 in | Wt 103.0 lb

## 2014-10-16 DIAGNOSIS — J209 Acute bronchitis, unspecified: Secondary | ICD-10-CM | POA: Diagnosis not present

## 2014-10-16 DIAGNOSIS — R05 Cough: Secondary | ICD-10-CM

## 2014-10-16 DIAGNOSIS — R059 Cough, unspecified: Secondary | ICD-10-CM

## 2014-10-16 DIAGNOSIS — R0602 Shortness of breath: Secondary | ICD-10-CM | POA: Diagnosis not present

## 2014-10-16 MED ORDER — DOXYCYCLINE HYCLATE 100 MG PO TABS: 100.0000 mg | ORAL_TABLET | Freq: Two times a day (BID) | ORAL | Status: DC

## 2014-10-16 NOTE — Patient Instructions (Signed)
Increase fluid intake  mucinex DM twice daily for 1 weeks due to cough  Will treat with doxycyline for acute bronchitis due to cough and fever Will get chest xray to evaluate for pneumonia

## 2014-10-16 NOTE — Progress Notes (Signed)
Patient ID: MARIJANE TROWER, female   DOB: 1927-04-20, 81 y.o.   MRN: [id number]    PCP: Hollace Kinnier, DO  Allergies  Allergen Reactions  . Codeine Shortness Of Breath  . Lexapro [Escitalopram Oxalate] Other (See Comments)    "Wiped patient out"  . Lodine [Etodolac]   . Namenda [Memantine Hcl] Other (See Comments)    "Wiped patient out"    Chief Complaint  Patient presents with  . Acute Visit    Fever, chills, and pain when breathing x 2 days      HPI: Patient is a 81 y.o. female seen in the office today for acute visit due to fever and chills. Pt with pmh of anxiety, dementia, shortness of breath. Pt living at [facility] noted fever and chills over the last 2 days, temperature of 101.2. HR has also been elevated 96-106. Has nonproductive cough. Pt reports abnormal sensation when she takes a deep breath. Noted increase RR at facility. Pt reports she feels tired, decreased appetite. Has shortness of breath.  Pt a very poor historian, here today alone. Facility sent information with pt.  Review of Systems: provided by information via sheet from [facility] and pt  Review of Systems  Constitutional: Positive for fatigue. Negative for activity change, appetite change and unexpected weight change.  HENT: Negative for congestion, hearing loss, postnasal drip, rhinorrhea and sinus pressure.   Eyes: Negative.   Respiratory: Positive for cough and shortness of breath. Negative for chest tightness.   Cardiovascular: Negative for chest pain, palpitations and leg swelling.  Gastrointestinal: Negative for abdominal pain, diarrhea and constipation.  Genitourinary: Negative for dysuria and difficulty urinating.  Musculoskeletal: Negative for myalgias and arthralgias.  Skin: Negative for color change and wound.  Neurological: Negative for dizziness and weakness.  Psychiatric/Behavioral: Negative for behavioral problems, confusion and agitation.    Past Medical History    Diagnosis Date  . Hypertension   . Palpitations   . SOB (shortness of breath) on exertion   . History of medication noncompliance   . Varicose veins   . Wears hearing aid   . Abnormal chest CT     Lesion is unchanged; Followed by Dr. Benay Spice  . Senile osteoporosis   . Anxiety   . Breast cancer 08/1994    Remote lumpectomy on L  . Breast cancer 05/2011    R breast with lumpectomy and XRT therapy  . Arthritis   . Vitamin D deficiency   . Other B-complex deficiencies   . Dementia in conditions classified elsewhere without behavioral disturbance   . Malignant neoplasm of breast (female), unspecified site   . Chronic pain   . Varicose veins of lower extremities with inflammation   . Hallucinations   . Insomnia, unspecified    Past Surgical History  Procedure Laterality Date  . Tonsillectomy    . Tubal ligation  1980  . Breast lumpectomy  09/01/2011  . Cataract extraction, bilateral    . US echocardiography  12/17/2009    EF 55-60%  . Breast lumpectomy  1996    left breast  . Varicose vein removal  1970   Social History:   reports that she quit smoking about 62 years ago. Her smoking use included Cigarettes. She has never used smokeless tobacco. She reports that she drinks alcohol. She reports that she does not use illicit drugs.  Family History  Problem Relation Age of Onset  . Heart disease Mother   . Heart disease Father   .  Heart disease Sister   . Heart disease Brother   . Arthritis Brother   . Heart disease Sister   . Alzheimer's disease Sister   . Cancer Neg Hx     Medications: Patient's Medications  New Prescriptions   No medications on file  Previous Medications   ASPIRIN 325 MG TABLET    Take 1 tablet (325 mg total) by mouth daily.   CHOLECALCIFEROL (VITAMIN D3) 2000 UNITS CAPSULE    Take 1 capsule (2,000 Units total) by mouth daily.  Modified Medications   No medications on file  Discontinued Medications   No medications on file     Physical  Exam:  Filed Vitals:   10/16/14 1128  BP: 120/78  Pulse: 90  Temp: 97.6 F (36.4 C)  TempSrc: Oral  Resp: 18  Height: 5\' 7"  (1.702 m)  Weight: 103 lb (46.72 kg)  SpO2: 96%    Physical Exam  Constitutional: She appears well-developed and well-nourished. No distress.  HENT:  Head: Normocephalic and atraumatic.  Right Ear: External ear normal.  Left Ear: External ear normal.  Nose: Nose normal.  Mouth/Throat: Oropharynx is clear and moist. No oropharyngeal exudate.  Eyes: Conjunctivae and EOM are normal. Pupils are equal, round, and reactive to light.  Neck: Normal range of motion. Neck supple.  Cardiovascular: Normal rate, regular rhythm and normal heart sounds.   Pulmonary/Chest: Effort normal and breath sounds normal. No respiratory distress. She has no wheezes.  Abdominal: Soft. Bowel sounds are normal.  Musculoskeletal: She exhibits no edema or tenderness.  Neurological: She is alert.  STML, poor historian   Skin: Skin is warm and dry. She is not diaphoretic.  Psychiatric: She has a normal mood and affect.    Labs reviewed: Basic Metabolic Panel:  Recent Labs  01/18/14 1417  05/18/14 1150 05/29/14 1800 06/28/14 1640  NA 138  < > 138 138 140  K 4.3  < > 4.4 3.2* 4.5  CL 98  < > 94* 94* 99  CO2 26  < > 29 31 27   GLUCOSE 96  < > 87 125* 94  BUN 20  < > 23 38* 26  CREATININE 0.81  < > 0.73 0.89 0.66  CALCIUM 9.6  < > 9.0 9.9 9.3  TSH 0.842  --   --   --  1.800  < > = values in this interval not displayed. Liver Function Tests:  Recent Labs  01/30/14 1420 05/29/14 1800 06/28/14 1640  AST 27 28 24   ALT 15 27 11   ALKPHOS 56 76 105  BILITOT 1.0 0.8 0.4  PROT 7.2 7.9 6.8  ALBUMIN 3.9 3.3*  --     Recent Labs  01/30/14 1420  LIPASE 66*   No results for input(s): AMMONIA in the last 8760 hours. CBC:  Recent Labs  01/18/14 1417 01/30/14 1225 05/29/14 1800  WBC 6.1 6.9 15.6*  NEUTROABS 4.4 5.1  --   HGB 13.3 14.3 13.8  HCT 39.8 41.3 41.1  MCV  93 90.6 93.4  PLT 214 247 331   Lipid Panel:  Recent Labs  01/31/14 0605  CHOL 182  HDL 71  LDLCALC 97  TRIG 70  CHOLHDL 2.6   TSH:  Recent Labs  01/18/14 1417 06/28/14 1640  TSH 0.842 1.800   A1C: Lab Results  Component Value Date   HGBA1C 5.8* 01/31/2014     Assessment/Plan 1. Cough - CBC with Differential/Platelet - DG Chest 2 View; Future rule out pneumonia -mucinex DM by  mouth twice daily for 1 week  2. Acute bronchitis, unspecified organism -pt with increased fatigue and fever for 2 days, will get chest xray rule out pneumonia - start doxycycline (VIBRA-TABS) 100 MG tablet; Take 1 tablet (100 mg total) by mouth 2 (two) times daily.  Dispense: 20 tablet; Refill: 0 -increase hydration  Follow up/seek medical attention if symptoms fail to improve or worsen.  Carlos American. Harle Battiest  [facility] [phone] 8 am - 5 pm) [phone] (after hours)

## 2014-10-17 LAB — CBC WITH DIFFERENTIAL/PLATELET
BASOS ABS: 0 10*3/uL (ref 0.0–0.2)
Basos: 0 %
EOS (ABSOLUTE): 0 10*3/uL (ref 0.0–0.4)
Eos: 0 %
HEMATOCRIT: 38.8 % (ref 34.0–46.6)
HEMOGLOBIN: 13 g/dL (ref 11.1–15.9)
IMMATURE GRANS (ABS): 0 10*3/uL (ref 0.0–0.1)
IMMATURE GRANULOCYTES: 0 %
LYMPHS ABS: 0.5 10*3/uL — AB (ref 0.7–3.1)
Lymphs: 3 %
MCH: 30.4 pg (ref 26.6–33.0)
MCHC: 33.5 g/dL (ref 31.5–35.7)
MCV: 91 fL (ref 79–97)
MONOS ABS: 0.9 10*3/uL (ref 0.1–0.9)
Monocytes: 5 %
Neutrophils Absolute: 16 10*3/uL — ABNORMAL HIGH (ref 1.4–7.0)
Neutrophils: 92 %
Platelets: 286 10*3/uL (ref 150–379)
RBC: 4.28 x10E6/uL (ref 3.77–5.28)
RDW: 13 % (ref 12.3–15.4)
WBC: 17.5 10*3/uL — ABNORMAL HIGH (ref 3.4–10.8)

## 2014-10-18 ENCOUNTER — Telehealth: Payer: Self-pay | Admitting: *Deleted

## 2014-10-18 ENCOUNTER — Encounter: Payer: Self-pay | Admitting: *Deleted

## 2014-10-18 DIAGNOSIS — D72829 Elevated white blood cell count, unspecified: Secondary | ICD-10-CM

## 2014-10-18 DIAGNOSIS — R5081 Fever presenting with conditions classified elsewhere: Secondary | ICD-10-CM

## 2014-10-18 NOTE — Telephone Encounter (Signed)
Called [place] to inform her that Sherrie Mustache wanted a urine sample for UA and C&S, she stated that she was unable to catch her in the room. She stated that she will attempt to get it in the am, and bring it to the office.

## 2014-10-18 NOTE — Telephone Encounter (Signed)
Spoke with patient's son regarding her lab results and informed him that she needed a clean catch urine sample. He stated that if it could be done at [facility] that would be great, so he would call to find out if that can be done there. The son called and stated that it could be done at the facility by Butch Penny, she would call the office to get a order.

## 2014-10-19 ENCOUNTER — Other Ambulatory Visit: Payer: Medicare Other

## 2014-10-19 DIAGNOSIS — R5081 Fever presenting with conditions classified elsewhere: Secondary | ICD-10-CM

## 2014-10-19 DIAGNOSIS — D72829 Elevated white blood cell count, unspecified: Secondary | ICD-10-CM

## 2014-10-20 LAB — URINALYSIS
Bilirubin, UA: NEGATIVE
Glucose, UA: NEGATIVE
KETONES UA: NEGATIVE
Nitrite, UA: NEGATIVE
RBC UA: NEGATIVE
SPEC GRAV UA: 1.029 (ref 1.005–1.030)
Urobilinogen, Ur: 1 mg/dL (ref 0.2–1.0)
pH, UA: 5.5 (ref 5.0–7.5)

## 2014-10-21 LAB — URINE CULTURE

## 2014-10-26 ENCOUNTER — Ambulatory Visit: Payer: Medicare Other | Admitting: Internal Medicine

## 2015-03-08 ENCOUNTER — Telehealth: Payer: Self-pay

## 2015-03-08 NOTE — Telephone Encounter (Signed)
-----   Message from Gayland Curry, DO sent at 03/08/2015 11:46 AM EST ----- Please call pt to see if she wants to come in for an apt.  She has not been here since summer.

## 2015-03-08 NOTE — Telephone Encounter (Signed)
Left message on machine for patients son to return call when available

## 2015-03-27 NOTE — Telephone Encounter (Signed)
Left message on machine for patient to return call when available   

## 2015-03-27 NOTE — Telephone Encounter (Signed)
Patient called the office back talked to her about making an appointment said she will call and make appointment after she talks to her son because he is the one that will bring her to appointment.

## 2015-05-07 ENCOUNTER — Ambulatory Visit (INDEPENDENT_AMBULATORY_CARE_PROVIDER_SITE_OTHER): Payer: Medicare Other | Admitting: Podiatry

## 2015-05-07 VITALS — BP 121/69 | HR 86 | Resp 12

## 2015-05-07 DIAGNOSIS — M79673 Pain in unspecified foot: Secondary | ICD-10-CM | POA: Diagnosis not present

## 2015-05-07 DIAGNOSIS — B351 Tinea unguium: Secondary | ICD-10-CM | POA: Diagnosis not present

## 2015-05-07 NOTE — Progress Notes (Signed)
   Subjective:    Patient ID: Rachael Perez, female    DOB: 05-11-1926, 82 y.o.   MRN: [id number]  HPI: She presents today with a chief complaint of painful elongated toenails the need to be debrided. She is brought in by her son. He states that she has a history of dementia.    Review of Systems  Skin: Positive for color change.  Psychiatric/Behavioral: Positive for confusion.       Objective:   Physical Exam: Vital signs are stable she is alert. Pulses are palpable bilateral. Mild possible hammertoe deformities are noted. Neurologic sensorium is intact. Her toenails are thick yellow dystrophic onychomycotic and painful on palpation. No open lesions or wounds. Orthopedic evaluation does do a straight hammertoe deformities.        Assessment & Plan:  Assessment: Pain limb secondary to onychomycosis area  Plan: Debridement of toenails 1 through 5 bilateral cover service secondary. Pain.

## 2015-05-16 ENCOUNTER — Telehealth: Payer: Self-pay

## 2015-05-16 NOTE — Telephone Encounter (Signed)
Dr. Elvin So called and stated that he would like to speak with Dr. Mariea Clonts about his mother.   Please call [phone]

## 2015-06-07 ENCOUNTER — Telehealth: Payer: Self-pay | Admitting: *Deleted

## 2015-06-07 DIAGNOSIS — Z974 Presence of external hearing-aid: Secondary | ICD-10-CM

## 2015-06-07 NOTE — Telephone Encounter (Signed)
[name], son called and stated that he needed a referral for his mother to take her to her hearing aid appointment to Dr. Posey Pronto [phone]. Referral placed.

## 2015-06-07 NOTE — Telephone Encounter (Signed)
Completed.  Spoke with Dr. Baldo Daub.  He reports that his mom has not been sleeping well off and on.  She has periods where she is more tearful, crying and c/o loneliness.  She also has some chronic palpitations that he's not sure if they are real palpitations or her anxiety.  She had chest pain a couple of weeks ago where EMS was called, but EKG was negative and they opted not to take her to the ED.  She has been breathing ok, but has gotten significantly weaker when ambulating to the dining room and was talking about getting a rollator walker.  He wonders if there is something I can put her on that won't be oversedating or make her more confused that might help her mood, appetite and sleep.  I left him know that I had not seen her since May 2016 and that we had reached out to his brother in Nov 2016 to get her an appt--I believe she got one, but then missed it.  [name] will talk with his brother to try to get [facility] in here for a visit.

## 2015-06-12 DIAGNOSIS — H903 Sensorineural hearing loss, bilateral: Secondary | ICD-10-CM | POA: Diagnosis not present

## 2015-08-06 ENCOUNTER — Ambulatory Visit (INDEPENDENT_AMBULATORY_CARE_PROVIDER_SITE_OTHER): Payer: Medicare Other | Admitting: Podiatry

## 2015-08-06 DIAGNOSIS — M79676 Pain in unspecified toe(s): Secondary | ICD-10-CM

## 2015-08-06 DIAGNOSIS — B351 Tinea unguium: Secondary | ICD-10-CM

## 2015-08-06 DIAGNOSIS — Q828 Other specified congenital malformations of skin: Secondary | ICD-10-CM | POA: Diagnosis not present

## 2015-08-06 NOTE — Progress Notes (Signed)
Subjective:     Patient ID: Rachael Perez, female   DOB: 1927-01-18, 82 y.o.   MRN: [id number]  HPIThis patient presents to the office for preventive foot care services.  She says her nails are long and painful.  Her son brings her to the office.   Review of Systems     Objective:   Physical Exam GENERAL APPEARANCE: Alert, conversant. Appropriately groomed. No acute distress.  VASCULAR: Pedal pulses are  palpable at  [facility] and PT bilateral.  Capillary refill time is immediate to all digits,  Normal temperature gradient.   NEUROLOGIC: sensation is normal to 5.07 monofilament at 5/5 sites bilateral.  Light touch is intact bilateral, Muscle strength normal.  MUSCULOSKELETAL: acceptable muscle strength, tone and stability bilateral.  Intrinsic muscluature intact bilateral.  Rectus appearance of foot and digits noted bilateral. 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 Hammer toes second B/L.  Tailor bunion 5th MPJ B/L.     DERMATOLOGIC: skin color, texture, and turgor are within normal limits.  No preulcerative lesions or ulcers  are seen, no interdigital maceration noted.  No open lesions present.  . No drainage noted. Porokeratosis sub1 right foot.  Marland KitchenNAILS  Long painful ingrown toenail B/L        Assessment:  Onychomycosis  Porokeratosis right foot.     Plan:  Debridement of nails.  Debridement of porokeratosis right  RTC 3 months   Gardiner Barefoot DPM

## 2015-10-07 ENCOUNTER — Encounter: Payer: Self-pay | Admitting: Internal Medicine

## 2015-10-07 ENCOUNTER — Ambulatory Visit (INDEPENDENT_AMBULATORY_CARE_PROVIDER_SITE_OTHER): Payer: Medicare Other | Admitting: Internal Medicine

## 2015-10-07 VITALS — BP 130/70 | HR 74 | Temp 97.6°F | Wt 110.0 lb

## 2015-10-07 DIAGNOSIS — I10 Essential (primary) hypertension: Secondary | ICD-10-CM | POA: Diagnosis not present

## 2015-10-07 DIAGNOSIS — R410 Disorientation, unspecified: Secondary | ICD-10-CM

## 2015-10-07 DIAGNOSIS — F0150 Vascular dementia without behavioral disturbance: Secondary | ICD-10-CM

## 2015-10-07 DIAGNOSIS — F411 Generalized anxiety disorder: Secondary | ICD-10-CM

## 2015-10-07 DIAGNOSIS — M810 Age-related osteoporosis without current pathological fracture: Secondary | ICD-10-CM | POA: Diagnosis not present

## 2015-10-07 LAB — POCT URINALYSIS DIPSTICK
Bilirubin, UA: NEGATIVE
Glucose, UA: NEGATIVE
Ketones, UA: NEGATIVE
Leukocytes, UA: NEGATIVE
Nitrite, UA: NEGATIVE
Protein, UA: 100
Spec Grav, UA: >=1.03
Urobilinogen, UA: NEGATIVE
pH, UA: 6

## 2015-10-07 NOTE — Progress Notes (Signed)
Location:  [facility] clinic Provider:  Shakila Mak L. Mariea Clonts, D.O., C.M.D.  Code Status: DNR Goals of Care:  Advanced Directives 10/07/2015  Does patient have an advance directive? Yes  Type of Advance Directive [place]  Does patient want to make changes to advanced directive? -  Copy of advanced directive(s) in chart? Yes     Chief Complaint  Patient presents with  . Acute Visit    discuss dementia, behavior change, confusion, depression    HPI: [name] seen today for medical management of chronic diseases.    She's been dressing up all out for casual everyday goings on.  Moods are up and down.   Hearing aides work but she does not put the batteries in them.  Still in [facility] independent.  Has "helpers" in the mornings and evenings--bathing, dressing, getting ready for bed.  She did not tolerate namenda in the past--made her more agitated and off balance.  Feels fine but rushed around.  No pain.  Is not sleeping well at all due to "all she has to do", but her son says she is not participating in activities like she used to.  She is less steady and now using a cane.  She admits to some palpitations and anxiety and has chronic chest pain that comes and goes from her breast cancer years ago.    She is easily agitated in the visit.  She says her mind is fine, it's just old age.  She gets mad at her son when he tries to "speak for her" to answer my questions when she can't hear them or does not answer appropriately.    She keeps talking about her plans for when she dies.  Says she is ready to go if it's time now or later, but doesn't think she'll live more than 3 years.  Denies suicidal thoughts.    Past Medical History  Diagnosis Date  . Hypertension   . Palpitations   . SOB (shortness of breath) on exertion   . History of medication noncompliance   . Varicose veins   . Wears hearing aid   . Abnormal chest CT     Lesion is unchanged; Followed by  Dr. Benay Spice  . Senile osteoporosis   . Anxiety   . Breast cancer ([place]) 08/1994    Remote lumpectomy on L  . Breast cancer ([place]) 05/2011    R breast with lumpectomy and XRT therapy  . Arthritis   . Vitamin D deficiency   . Other B-complex deficiencies   . Dementia in conditions classified elsewhere without behavioral disturbance   . Malignant neoplasm of breast (female), unspecified site   . Chronic pain   . Varicose veins of lower extremities with inflammation   . Hallucinations   . Insomnia, unspecified     Past Surgical History  Procedure Laterality Date  . Tonsillectomy    . Tubal ligation  1980  . Breast lumpectomy  09/01/2011  . Cataract extraction, bilateral    . US echocardiography  12/17/2009    EF 55-60%  . Breast lumpectomy  1996    left breast  . Varicose vein removal  1970    Allergies  Allergen Reactions  . Codeine Shortness Of Breath  . Lexapro [Escitalopram Oxalate] Other (See Comments)    "Wiped patient out"  . Lodine [Etodolac]   . Namenda [Memantine Hcl] Other (See Comments)    "Wiped patient out"      Medication  List       This list is accurate as of: 10/07/15  3:14 PM.  Always use your most recent med list.               aspirin 325 MG tablet  Take 1 tablet (325 mg total) by mouth daily.        Review of Systems:  Review of Systems  Constitutional: Negative for fever, chills, weight loss and malaise/fatigue.       Has gained 7lbs  HENT: Positive for hearing loss. Negative for congestion.        Wears hearing aides, but does not put batteries in them  Eyes: Negative for blurred vision.  Respiratory: Positive for shortness of breath. Negative for cough and wheezing.   Cardiovascular: Positive for chest pain and palpitations. Negative for orthopnea, claudication and leg swelling.  Gastrointestinal: Negative for abdominal pain, constipation, blood in stool and melena.  Genitourinary: Negative for dysuria.  Musculoskeletal: Positive for  falls.       Unsteady gait--using cane now  Neurological: Positive for weakness. Negative for dizziness and loss of consciousness.  Psychiatric/Behavioral: Positive for depression and memory loss. Negative for suicidal ideas and hallucinations. The patient is nervous/anxious and has insomnia.        Is tearful at times, agitated other times    Health Maintenance  Topic Date Due  . TETANUS/TDAP  08/13/1947  . ZOSTAVAX  08/12/1988  . PNA vac Low Risk Adult (1 of 2 - PCV13) 08/12/1993  . INFLUENZA VACCINE  11/19/2015  . DEXA SCAN  Completed    Physical Exam: Filed Vitals:   10/07/15 1452  BP: 130/70  Pulse: 74  Temp: 97.6 F (36.4 C)  TempSrc: Oral  Weight: 110 lb (49.896 kg)  SpO2: 97%   Body mass index is 17.22 kg/(m^2). Physical Exam  Constitutional: No distress.  Cardiovascular: Normal rate, regular rhythm, normal heart sounds and intact distal pulses.   Pulmonary/Chest: Effort normal and breath sounds normal.  Musculoskeletal: Normal range of motion.  Walking with cane  Neurological: She is alert.  Not oriented to this office, knew [place], oriented to month, but not rest; scored 20/30 on MMSE and failed clock; poor short term memory forgetting what she's said moments before  Skin: Skin is warm and dry.  Psychiatric:  Tearful one moment, angry and shouting at her son the next, then sweet as can be a moment after that   MMSE - [place] Exam 10/07/2015 11/07/2012  Orientation to time 3 5  Orientation to Place 3 5  Registration 3 3  Attention/ Calculation 5 5  Recall 0 2  Language- name 2 objects 2 2  Language- repeat 1 1  Language- follow 3 step command 1 3  Language- read & follow direction 1 1  Write a sentence 1 1  Copy design 0 1  Total score 20 29   Labs reviewed:   Recent Labs  10/16/14 1146  WBC 17.5*  NEUTROABS 16.0*  HCT 38.8  MCV 91  PLT 286   Lab Results  Component Value Date   HGBA1C 5.8* 01/31/2014   Assessment/Plan 1.  Vascular dementia without behavioral disturbance - is progressing - she still lives in independent living with personal care twice a day - seems she'd be ok in assisted living where meds are administered to her (so we could actually put her on some) and with continued CNA help at least 2x per day  2. Generalized anxiety disorder - seems  worse again--goes through spurts -she unfortunately thinks she still has all kinds of things she must attend to like she did all of her life, but she has not been doing all of this anymore  3. Senile osteoporosis -should be on vitamin D and fosamax or prolia, but there were dental concerns that prevented the medications and vitamin D just fell off b/c she does not remember to take it  4. Essential hypertension, benign -bp is well controlled today w/o any meds  5.  Change in mental status with disorientation and agitation -check labs to make sure something new is not going on, but I suspect her dementia is just progressing along with anxiety and depression -urine dipstick was negative for any signs of infection today so no culture was sent as she has no new symptoms -is using a pad for some urinary incontinence now likely also due to progressive disease and functional decline -balance worsening and needing cane -got 20/30 failing clock with last mmse score that was properly documented being 29/30 in July of 2014  Labs/tests ordered:   Orders Placed This Encounter  Procedures  . CBC with Differential/Platelet  . Comprehensive metabolic panel    Order Specific Question:  Has the patient fasted?    Answer:  Yes  . Lipid panel    Order Specific Question:  Has the patient fasted?    Answer:  Yes  . Urinalysis, dipstick only    Next appt:  11/18/2015 f/u on memory  Sadarius Norman L. Laken Rog, D.O. [facility] [address] [place],  [address] Cell Phone (Mon-Fri 8am-5pm):  [phone] On Call:  [phone] &  follow prompts after 5pm & weekends Office Phone:  [phone] Office Fax:  [phone]

## 2015-10-08 LAB — CBC WITH DIFFERENTIAL/PLATELET
Basophils Absolute: 0 10*3/uL (ref 0.0–0.2)
Basos: 0 %
EOS (ABSOLUTE): 0.1 10*3/uL (ref 0.0–0.4)
Eos: 1 %
Hematocrit: 42.1 % (ref 34.0–46.6)
Hemoglobin: 14 g/dL (ref 11.1–15.9)
Immature Grans (Abs): 0 10*3/uL (ref 0.0–0.1)
Immature Granulocytes: 0 %
Lymphocytes Absolute: 1.9 10*3/uL (ref 0.7–3.1)
Lymphs: 22 %
MCH: 30.2 pg (ref 26.6–33.0)
MCHC: 33.3 g/dL (ref 31.5–35.7)
MCV: 91 fL (ref 79–97)
Monocytes Absolute: 0.8 10*3/uL (ref 0.1–0.9)
Monocytes: 9 %
Neutrophils Absolute: 5.8 10*3/uL (ref 1.4–7.0)
Neutrophils: 68 %
Platelets: 247 10*3/uL (ref 150–379)
RBC: 4.63 x10E6/uL (ref 3.77–5.28)
RDW: 14.4 % (ref 12.3–15.4)
WBC: 8.6 10*3/uL (ref 3.4–10.8)

## 2015-10-08 LAB — LIPID PANEL
Chol/HDL Ratio: 2.8 ratio units (ref 0.0–4.4)
Cholesterol, Total: 216 mg/dL — ABNORMAL HIGH (ref 100–199)
HDL: 78 mg/dL (ref >39)
LDL Calculated: 114 mg/dL — ABNORMAL HIGH (ref 0–99)
Triglycerides: 120 mg/dL (ref 0–149)
VLDL Cholesterol Cal: 24 mg/dL (ref 5–40)

## 2015-10-08 LAB — COMPREHENSIVE METABOLIC PANEL
ALT: 12 IU/L (ref 0–32)
AST: 23 IU/L (ref 0–40)
Albumin/Globulin Ratio: 1.4 (ref 1.2–2.2)
Albumin: 4.2 g/dL (ref 3.5–4.7)
Alkaline Phosphatase: 83 IU/L (ref 39–117)
BUN/Creatinine Ratio: 28 (ref 12–28)
BUN: 24 mg/dL (ref 8–27)
Bilirubin Total: 0.5 mg/dL (ref 0.0–1.2)
CO2: 27 mmol/L (ref 18–29)
Calcium: 9.4 mg/dL (ref 8.7–10.3)
Chloride: 98 mmol/L (ref 96–106)
Creatinine, Ser: 0.87 mg/dL (ref 0.57–1.00)
GFR calc Af Amer: 69 mL/min/{1.73_m2} (ref >59)
GFR calc non Af Amer: 60 mL/min/{1.73_m2} (ref >59)
Globulin, Total: 3.1 g/dL (ref 1.5–4.5)
Glucose: 66 mg/dL (ref 65–99)
Potassium: 4.2 mmol/L (ref 3.5–5.2)
Sodium: 140 mmol/L (ref 134–144)
Total Protein: 7.3 g/dL (ref 6.0–8.5)

## 2015-10-10 ENCOUNTER — Encounter: Payer: Self-pay | Admitting: *Deleted

## 2015-10-10 ENCOUNTER — Telehealth: Payer: Self-pay | Admitting: *Deleted

## 2015-10-10 NOTE — Telephone Encounter (Signed)
Dr. Elvin So called and is requesting to speak with Dr. Mariea Clonts directly. Please Call # [phone]

## 2015-10-11 NOTE — Telephone Encounter (Signed)
Spoke with [name] about his mother.  He reports that she is needing continued cueing with her morning routine.  I recommended at least assisted living, possibly a memory care ideally.  It will need to be a facility with medicaid eligibility as she is reaching the end of her personal funds.  We discussed some options briefly and he is going to work on looking into that.  We reviewed her MMSE, behavior during the visit and need for meds to help her depression.

## 2015-11-05 ENCOUNTER — Encounter: Payer: Self-pay | Admitting: Podiatry

## 2015-11-05 ENCOUNTER — Ambulatory Visit (INDEPENDENT_AMBULATORY_CARE_PROVIDER_SITE_OTHER): Payer: Medicare Other | Admitting: Podiatry

## 2015-11-05 DIAGNOSIS — B351 Tinea unguium: Secondary | ICD-10-CM | POA: Diagnosis not present

## 2015-11-05 DIAGNOSIS — Q828 Other specified congenital malformations of skin: Secondary | ICD-10-CM | POA: Diagnosis not present

## 2015-11-05 DIAGNOSIS — L720 Epidermal cyst: Secondary | ICD-10-CM | POA: Diagnosis not present

## 2015-11-05 DIAGNOSIS — M79676 Pain in unspecified toe(s): Secondary | ICD-10-CM | POA: Diagnosis not present

## 2015-11-05 NOTE — Progress Notes (Signed)
Subjective:     Patient ID: Rachael Perez, female   DOB: Feb 09, 1927, 82 y.o.   MRN: [id number]  HPIThis patient presents to the office for preventive foot care services.  She says her nails are long and painful.  Her son brings her to the office.   Review of Systems     Objective:   Physical Exam GENERAL APPEARANCE: Alert, conversant. Appropriately groomed. No acute distress.  VASCULAR: Pedal pulses are  palpable at  [facility] and PT bilateral.  Capillary refill time is immediate to all digits,  Normal temperature gradient.   NEUROLOGIC: sensation is normal to 5.07 monofilament at 5/5 sites bilateral.  Light touch is intact bilateral, Muscle strength normal.  MUSCULOSKELETAL: acceptable muscle strength, tone and stability bilateral.  Intrinsic muscluature intact bilateral.  Rectus appearance of foot and digits noted bilateral. 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 Hammer toes second B/L.  Tailor bunion 5th MPJ B/L.     DERMATOLOGIC: skin color, texture, and turgor are within normal limits.  No preulcerative lesions or ulcers  are seen, no interdigital maceration noted.  No open lesions present.  . No drainage noted. Porokeratosis sub1 right foot.  Marland KitchenNAILS  Long painful ingrown toenail B/L        Assessment:  Onychomycosis  Porokeratosis right foot.     Plan:  Debridement of nails.  Debridement of porokeratosis right  RTC 3 months   Gardiner Barefoot DPM

## 2015-11-13 IMAGING — CT CT HEAD W/O CM
2 series · 16 of 30 positions shown, 18 images · non-contrast
Comparison: None.

CLINICAL DATA: Altered mental status/confusion, progressed recently

EXAM:
CT HEAD WITHOUT CONTRAST
TECHNIQUE: Contiguous axial images were obtained from the base of the skull
through the vertex without intravenous contrast.

[Series 201: head w/o, idose (1) · axial · non-contrast · 0.49mm/px · z∈[+93,+208]mm · 8 of 31 slices shown, 10 images]
[im 4/31  brain]
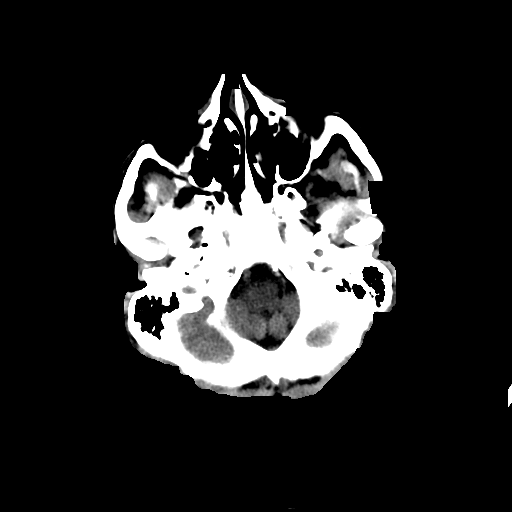
[im 4/31  bone]
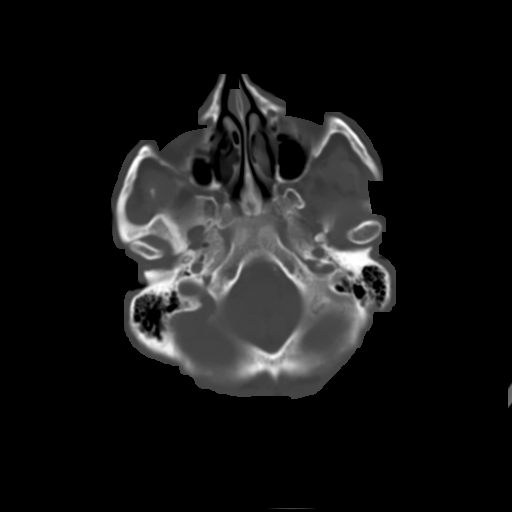
[im 7/31  brain]
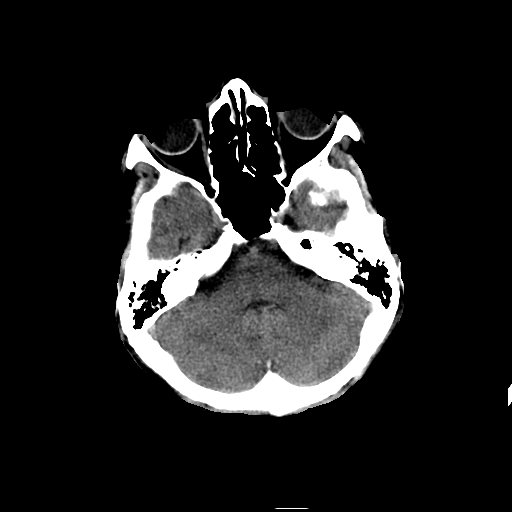
[im 11/31  brain]
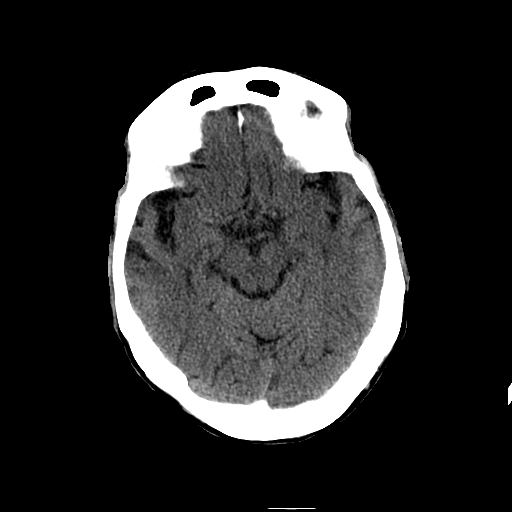
[im 14/31  brain]
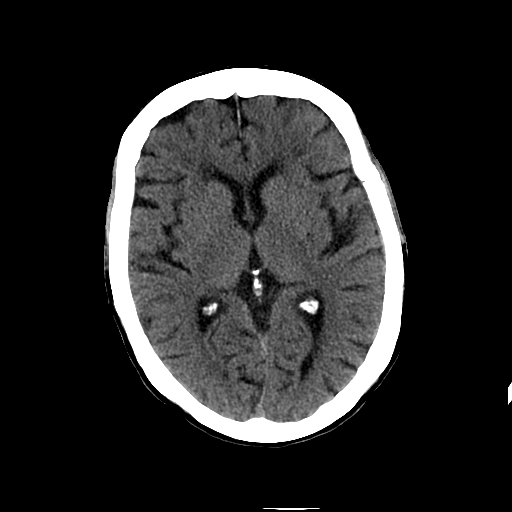
[im 17/31  brain]
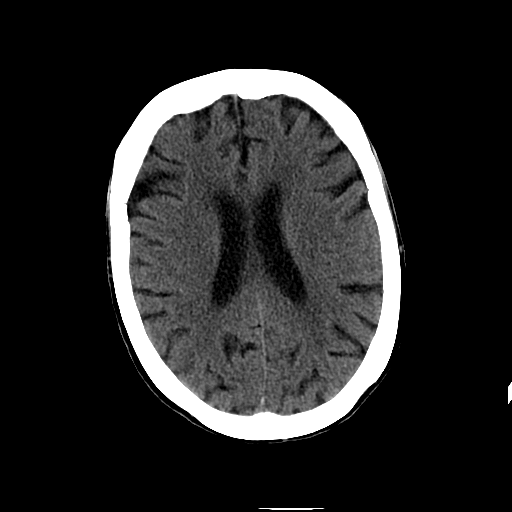
[im 17/31  bone]
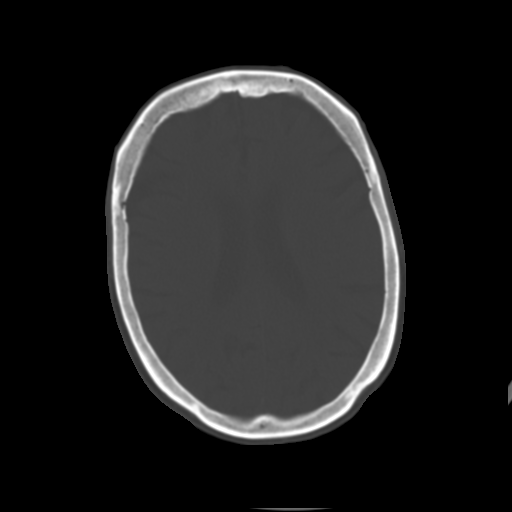
[im 21/31  brain]
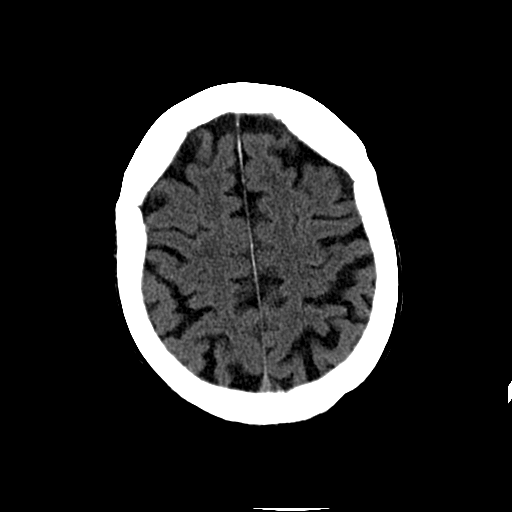
[im 24/31  brain]
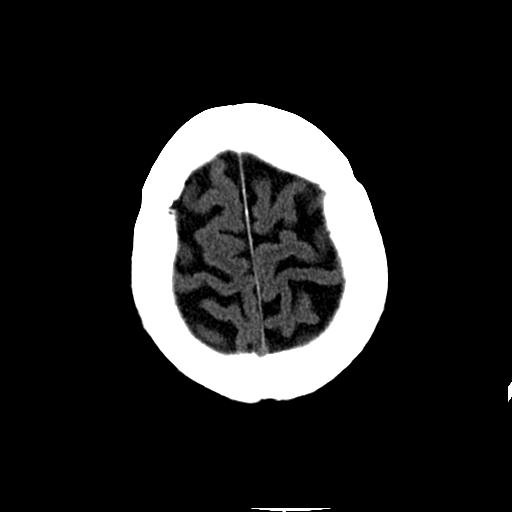
[im 27/31  brain]
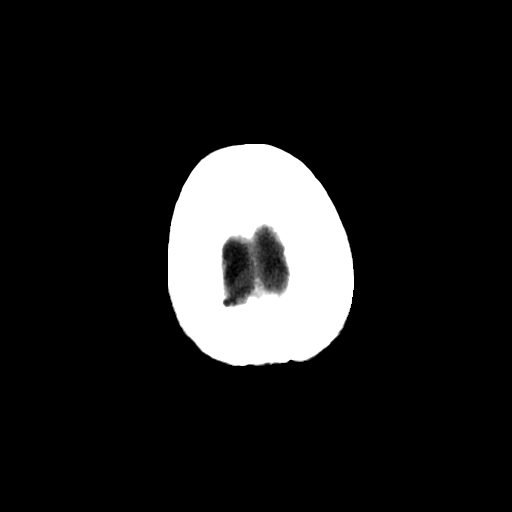

[Series 202: head w/o bone, idose (1) · axial · non-contrast · 0.49mm/px · z∈[+91,+211]mm · 8 of 62 slices shown]
[im 7/62  bone]
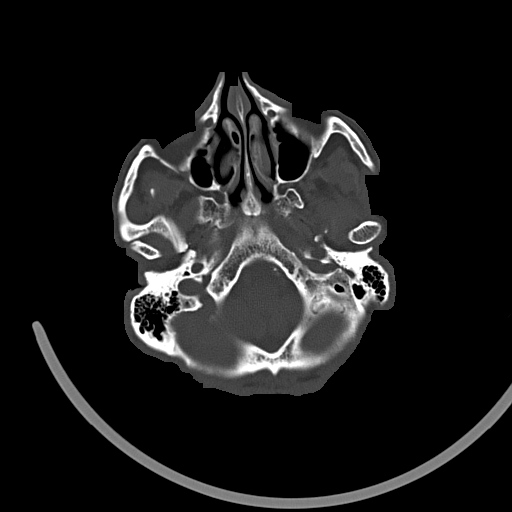
[im 13/62  bone]
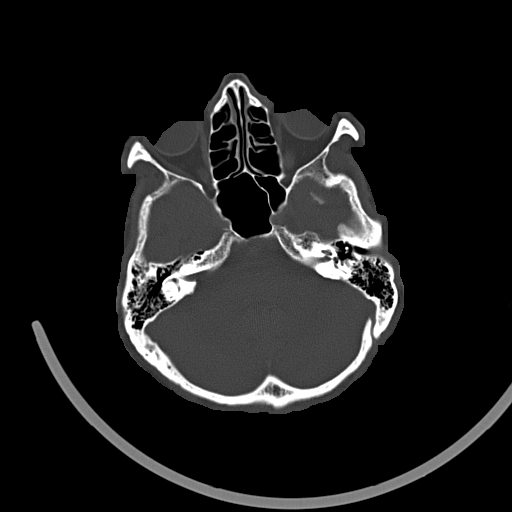
[im 20/62  bone]
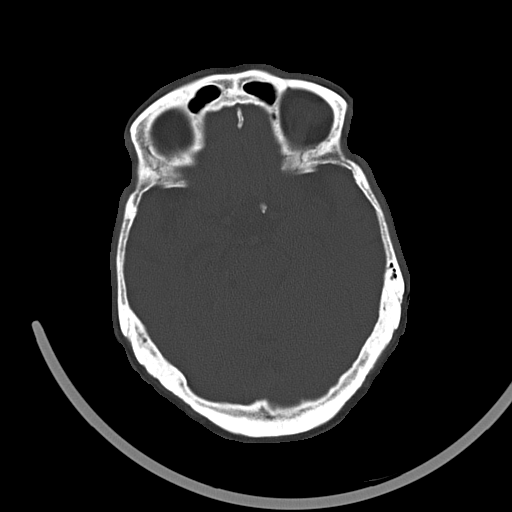
[im 26/62  bone]
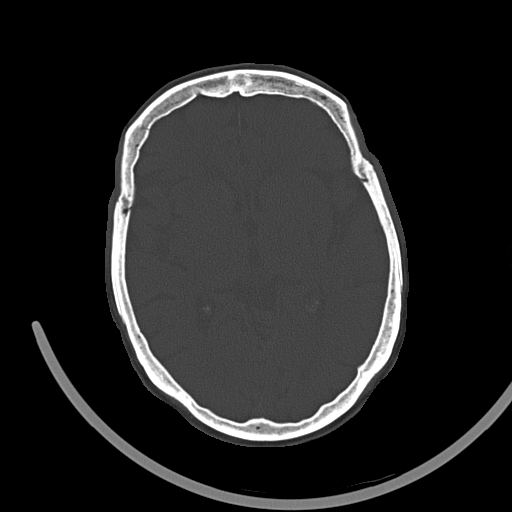
[im 36/62  bone]
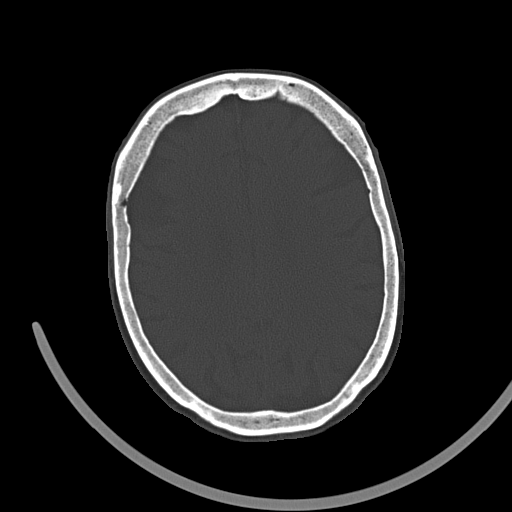
[im 42/62  bone]
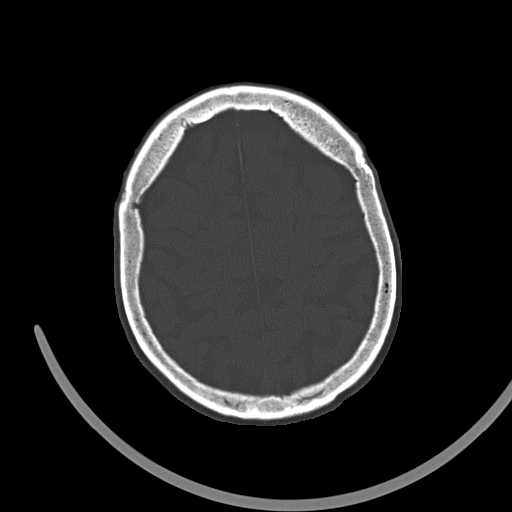
[im 49/62  bone]
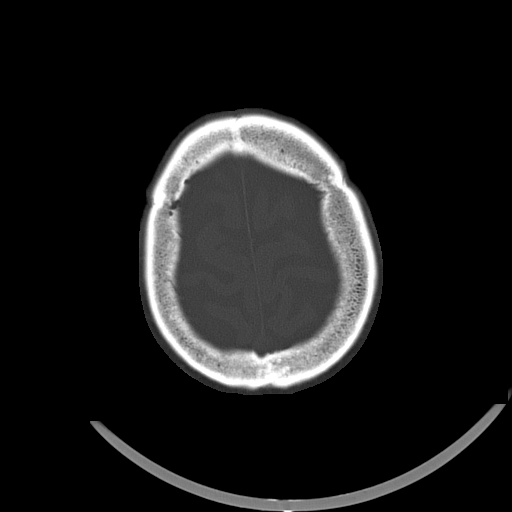
[im 55/62  bone]
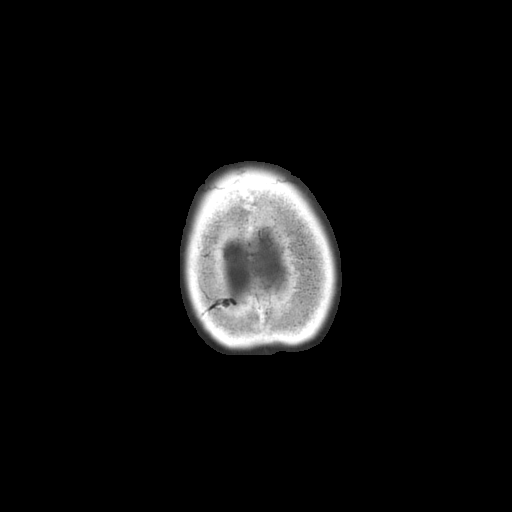

[16 of 30 positions shown; findings below may reference images not displayed]

FINDINGS: There is mild diffuse atrophy. There is no appreciable mass,
hemorrhage, extra-axial fluid collection, or midline shift. There is
patchy small vessel disease in the centra semiovale bilaterally.
There is a small focus of decreased attenuation in the posterior
limb of the left extreme capsule which may represent a small acute
infarct. This finding is seen on slice 13 series 201. No other
findings concerning for potential acute infarct present. Bony
calvarium appears intact. Mastoid air cells are clear. There is
opacification of a mid left ethmoid air cell. There are vertebral
artery calcifications bilaterally.
IMPRESSION: Atrophy with periventricular small vessel disease. Suspect small
acute infarct in the posterior limb of the left extreme capsule. No
hemorrhage or mass effect. Left ethmoid sinus disease. Bilateral
vertebral artery calcification.

## 2015-11-18 ENCOUNTER — Encounter: Payer: Self-pay | Admitting: Internal Medicine

## 2015-11-18 ENCOUNTER — Ambulatory Visit (INDEPENDENT_AMBULATORY_CARE_PROVIDER_SITE_OTHER): Payer: Medicare Other | Admitting: Internal Medicine

## 2015-11-18 VITALS — BP 150/70 | HR 74 | Temp 97.5°F | Ht 67.0 in | Wt 113.0 lb

## 2015-11-18 DIAGNOSIS — Z23 Encounter for immunization: Secondary | ICD-10-CM

## 2015-11-18 DIAGNOSIS — F411 Generalized anxiety disorder: Secondary | ICD-10-CM

## 2015-11-18 DIAGNOSIS — F329 Major depressive disorder, single episode, unspecified: Secondary | ICD-10-CM

## 2015-11-18 DIAGNOSIS — H6121 Impacted cerumen, right ear: Secondary | ICD-10-CM

## 2015-11-18 DIAGNOSIS — F32A Depression, unspecified: Secondary | ICD-10-CM

## 2015-11-18 DIAGNOSIS — F0150 Vascular dementia without behavioral disturbance: Secondary | ICD-10-CM | POA: Diagnosis not present

## 2015-11-18 MED ORDER — MIRTAZAPINE 7.5 MG PO TABS: 7.5000 mg | ORAL_TABLET | Freq: Every day | ORAL | 3 refills | Status: DC

## 2015-11-18 NOTE — Progress Notes (Signed)
Location:  [facility] clinic Provider:  Emelyn Roen L. Mariea Clonts, D.O., C.M.D.  Code Status: DNR Goals of Care:  Advanced Directives 11/18/2015  Does patient have an advance directive? Yes  Type of Advance Directive [place]  Does patient want to make changes to advanced directive? -  Copy of advanced directive(s) in chart? Yes  Would patient like information on creating an advanced directive? -     Chief Complaint  Patient presents with  . Medical Management of Chronic Issues    6 week follow-up on memory    HPI: [name] seen today for medical management of chronic diseases.    Right buttock had a sore place with a scab on it.  Her son, [name],  bought desitin for her to use and she notes improvement, but won't let me check it.  Wearing hearing aide but still couldn't hear and battery seemed not to work.  Cerumen was cleaned manually from her ear by me today with white plastic curette, but no improvement.  She was over on July 4th with her son here locally [name]).  Got frustrated and angry and she insisted upon walking home.  Then would not get in the car and sat on the porch.  Police had to come to address it.  After her last visit, I spoke with her son, [name], recommending more supervision but ideally 24 hr care in an [place] environment with med mgt.    Past Medical History:  Diagnosis Date  . Abnormal chest CT    Lesion is unchanged; Followed by Dr. Benay Spice  . Anxiety   . Arthritis   . Breast cancer ([place]) 08/1994   Remote lumpectomy on L  . Breast cancer ([place]) 05/2011   R breast with lumpectomy and XRT therapy  . Chronic pain   . Dementia in conditions classified elsewhere without behavioral disturbance   . Hallucinations   . History of medication noncompliance   . Hypertension   . Insomnia, unspecified   . Malignant neoplasm of breast (female), unspecified site   . Other B-complex deficiencies   . Palpitations   . Senile osteoporosis   . SOB  (shortness of breath) on exertion   . Varicose veins   . Varicose veins of lower extremities with inflammation   . Vitamin D deficiency   . Wears hearing aid     Past Surgical History:  Procedure Laterality Date  . BREAST LUMPECTOMY  09/01/2011  . BREAST LUMPECTOMY  1996   left breast  . CATARACT EXTRACTION, BILATERAL    . TONSILLECTOMY    . TUBAL LIGATION  1980  . US ECHOCARDIOGRAPHY  12/17/2009   EF 55-60%  . varicose vein removal  1970    Allergies  Allergen Reactions  . Codeine Shortness Of Breath  . Lexapro [Escitalopram Oxalate] Other (See Comments)    "Wiped patient out"  . Lodine [Etodolac]   . Namenda [Memantine Hcl] Other (See Comments)    "Wiped patient out"      Medication List       Accurate as of 11/18/15  3:23 PM. Always use your most recent med list.          aspirin 325 MG tablet Take 1 tablet (325 mg total) by mouth daily.       Review of Systems:  Review of Systems  Constitutional: Negative for chills, fever and malaise/fatigue.  HENT: Positive for hearing loss. Negative for congestion.  Right ear with cerumen (see hpi)  Eyes: Negative for blurred vision.  Respiratory: Negative for cough and shortness of breath.   Cardiovascular: Positive for chest pain. Negative for palpitations and leg swelling.  Gastrointestinal: Negative for abdominal pain.  Genitourinary: Negative for dysuria.  Musculoskeletal: Negative for falls and myalgias.       Using cane  Skin: Negative for rash.  Neurological: Negative for dizziness, loss of consciousness, weakness and headaches.  Endo/Heme/Allergies: Bruises/bleeds easily.  Psychiatric/Behavioral: Positive for depression and memory loss. The patient is nervous/anxious. The patient does not have insomnia.     Health Maintenance  Topic Date Due  . TETANUS/TDAP  08/[birth year]/1947  . ZOSTAVAX  08/12/1988  . PNA vac Low Risk Adult (1 of 2 - PCV13) 08/12/1993  . MAMMOGRAM  09/27/2013  . INFLUENZA VACCINE   11/19/2015  . DEXA SCAN  Completed    Physical Exam: Vitals:   11/18/15 1447  BP: (!) 150/70  Pulse: 74  Temp: 97.5 F (36.4 C)  TempSrc: Oral  SpO2: 95%  Weight: 113 lb (51.3 kg)  Height: 5\' 7"  (1.702 m)   Body mass index is 17.7 kg/m. Physical Exam  Constitutional: No distress.  HENT:  Right ear cerumen impaction was removed with white plastic curette instrumentation by me today  Cardiovascular: Normal rate, regular rhythm, normal heart sounds and intact distal pulses.   Pulmonary/Chest: Effort normal and breath sounds normal.  Neurological: She is alert.  Oriented to person, place, not time; poor short term memory--repeating herself throughout the day  Skin: Skin is warm and dry.    Labs reviewed: Basic Metabolic Panel:  Recent Labs  10/07/15 1622  NA 140  K 4.2  CL 98  CO2 27  GLUCOSE 66  BUN 24  CREATININE 0.87  CALCIUM 9.4   Liver Function Tests:  Recent Labs  10/07/15 1622  AST 23  ALT 12  ALKPHOS 83  BILITOT 0.5  PROT 7.3  ALBUMIN 4.2   No results for input(s): LIPASE, AMYLASE in the last 8760 hours. No results for input(s): AMMONIA in the last 8760 hours. CBC:  Recent Labs  10/07/15 1622  WBC 8.6  NEUTROABS 5.8  HCT 42.1  MCV 91  PLT 247   Lipid Panel:  Recent Labs  10/07/15 1622  CHOL 216*  HDL 78  LDLCALC 114*  TRIG 120  CHOLHDL 2.8   Lab Results  Component Value Date   HGBA1C 5.8 (H) 01/31/2014   Assessment/Plan 1. Depression - I believe we actually did try remeron before, but at that point there was no one checking if she took her medications and they now have someone who can per [name]  -will see if this helps her spirits, sleep and appetite  -there's question if she got nightmares from it before so if this recurs, it needs to be added to intolerances - mirtazapine (REMERON) 7.5 MG tablet; Take 1 tablet (7.5 mg total) by mouth at bedtime.  Dispense: 30 tablet; Refill: 3  2. Vascular dementia without behavioral  disturbance -is progressing -she is not admitted to any significant memory loss, did not tolerate namenda and cannot afford the weight loss side effect of aricept/acetylcholinesterase inhibitors -needs at least AL vs. Memory care   3. Generalized anxiety disorder -ongoing, but would do worse with benzo and possibly fall  4. Right ear impacted cerumen -was mechanically debrided with instrumentation by me today -staff need to make sure she has working batteries in her hearing aides at all times  5. Need for vaccination with 13-polyvalent pneumococcal conjugate vaccine - Pneumococcal conjugate vaccine 13-valent  Labs/tests ordered:   Orders Placed This Encounter  Procedures  . Pneumococcal conjugate vaccine 13-valent   Next appt:  Her son left w/o making an appt b/c receptionist was on the phone  Ondine Gemme L. Wally Shevchenko, D.O. [facility] [address], [place] [address] Cell Phone (Mon-Fri 8am-5pm):  [phone] On Call:  [phone] & follow prompts after 5pm & weekends Office Phone:  [phone] Office Fax:  [phone]

## 2016-02-04 ENCOUNTER — Ambulatory Visit (INDEPENDENT_AMBULATORY_CARE_PROVIDER_SITE_OTHER): Payer: Medicare Other | Admitting: Podiatry

## 2016-02-04 ENCOUNTER — Encounter: Payer: Self-pay | Admitting: Podiatry

## 2016-02-04 VITALS — Ht 67.0 in | Wt 113.0 lb

## 2016-02-04 DIAGNOSIS — B351 Tinea unguium: Secondary | ICD-10-CM | POA: Diagnosis not present

## 2016-02-04 DIAGNOSIS — M79676 Pain in unspecified toe(s): Secondary | ICD-10-CM

## 2016-02-04 NOTE — Progress Notes (Signed)
Subjective:     Patient ID: Rachael Perez, female   DOB: [birth year], 82 y.o.   MRN: [id number]  HPIThis patient presents to the office for preventive foot care services.  She says her nails are long and painful.  Her son brings her to the office.   Review of Systems     Objective:   Physical Exam GENERAL APPEARANCE: Alert, conversant. Appropriately groomed. No acute distress.  VASCULAR: Pedal pulses are  palpable at  [facility] and PT bilateral.  Capillary refill time is immediate to all digits,  Normal temperature gradient.   NEUROLOGIC: sensation is normal to 5.07 monofilament at 5/5 sites bilateral.  Light touch is intact bilateral, Muscle strength normal.  MUSCULOSKELETAL: acceptable muscle strength, tone and stability bilateral.  Intrinsic muscluature intact bilateral.  Rectus appearance of foot and digits noted bilateral. 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 Hammer toes second B/L.  Tailor bunion 5th MPJ B/L.     DERMATOLOGIC: skin color, texture, and turgor are within normal limits.  No preulcerative lesions or ulcers  are seen, no interdigital maceration noted.  No open lesions present.  . No drainage noted. Porokeratosis sub1 right foot.  Marland KitchenNAILS  Long painful ingrown toenail B/L        Assessment:  Onychomycosis  Porokeratosis right foot.     Plan:  Debridement of nails.  Debridement of porokeratosis right  RTC 3 months   Gardiner Barefoot DPM

## 2016-02-10 DIAGNOSIS — L720 Epidermal cyst: Secondary | ICD-10-CM | POA: Diagnosis not present

## 2016-02-24 ENCOUNTER — Telehealth: Payer: Self-pay | Admitting: Internal Medicine

## 2016-02-24 NOTE — Telephone Encounter (Signed)
left msg asking pt to confirm this AWV appt w/ nurse. VDM (DD) °

## 2016-03-02 ENCOUNTER — Ambulatory Visit (INDEPENDENT_AMBULATORY_CARE_PROVIDER_SITE_OTHER): Payer: Medicare Other

## 2016-03-02 DIAGNOSIS — I10 Essential (primary) hypertension: Secondary | ICD-10-CM | POA: Diagnosis not present

## 2016-03-02 NOTE — Progress Notes (Signed)
Unable to do AWV due to pt not being able to focus. Upset by son's behavior and actions. BP was elevated at 168/80 but stated she felt fine, other than how her son was acting.   Pt would not be a good candidate for future AWV's with myself, as her concentration is not very strong.     Noted.  Pt has moderate dementia and depression with anxiety.  Lives in an [place] with a caregiver.  Her son, a Psychologist, sport and exercise who lives at the [place], is much more supportive in her care.      I reviewed health advisor's note, was available for consultation and agree with the assessment and plan as written.    Shamikia Linskey L. Laveda Demedeiros, D.O. [facility] [address], [place] [address] Cell Phone (Mon-Fri 8am-5pm):  [phone] On Call:  [phone] & follow prompts after 5pm & weekends Office Phone:  [phone] Office Fax:  [phone]

## 2016-05-05 ENCOUNTER — Ambulatory Visit: Payer: Medicare Other | Admitting: Podiatry

## 2016-05-21 ENCOUNTER — Other Ambulatory Visit: Payer: Self-pay

## 2016-05-21 MED ORDER — MIRTAZAPINE 7.5 MG PO TABS: 7.5000 mg | ORAL_TABLET | Freq: Every day | ORAL | 1 refills | Status: DC

## 2016-06-29 ENCOUNTER — Telehealth: Payer: Self-pay | Admitting: *Deleted

## 2016-06-29 NOTE — Telephone Encounter (Signed)
Dr. Elvin So called and would like to speak with you regarding his mother. In [facility] and trying to get VA Benefits for patients. Patient is getting demented and cannot locate her cards or information. Needs her Social Security Number and wants [place] to give it to him from our records. Also wants a copy of Demographic sheet with her picture because the bank needs a photo but patient does not have her ID card, Drivers license or Travel visa anymore. He wants to know your opinion.  Please Advise.

## 2016-06-30 NOTE — Telephone Encounter (Signed)
Dr. Elvin So called back this morning requesting to speak with Dr. Mariea Clonts regarding concerns. Wants Dr. Mariea Clonts to call him at [phone]

## 2016-06-30 NOTE — Telephone Encounter (Signed)
Called him back this am and left message saying I don't think we can supply this info from her medical record for HIPPA reasons.  Awaiting his call back.  Also suggested he contact her home facility who should have these items on file.

## 2016-09-09 ENCOUNTER — Emergency Department (HOSPITAL_COMMUNITY)
Admission: EM | Admit: 2016-09-09 | Discharge: 2016-09-09 | Disposition: A | Discharge status: Home / Self Care | Payer: Medicare Other | Attending: Emergency Medicine | Admitting: Emergency Medicine

## 2016-09-09 ENCOUNTER — Encounter (HOSPITAL_COMMUNITY): Payer: Self-pay | Admitting: Emergency Medicine

## 2016-09-09 DIAGNOSIS — Z87891 Personal history of nicotine dependence: Secondary | ICD-10-CM | POA: Diagnosis not present

## 2016-09-09 DIAGNOSIS — I10 Essential (primary) hypertension: Secondary | ICD-10-CM | POA: Insufficient documentation

## 2016-09-09 DIAGNOSIS — Z7982 Long term (current) use of aspirin: Secondary | ICD-10-CM | POA: Insufficient documentation

## 2016-09-09 DIAGNOSIS — R030 Elevated blood-pressure reading, without diagnosis of hypertension: Secondary | ICD-10-CM | POA: Diagnosis not present

## 2016-09-09 DIAGNOSIS — Z853 Personal history of malignant neoplasm of breast: Secondary | ICD-10-CM | POA: Diagnosis not present

## 2016-09-09 LAB — CBC WITH DIFFERENTIAL/PLATELET
Basophils Absolute: 0 10*3/uL (ref 0.0–0.1)
Basophils Relative: 1 %
Eosinophils Absolute: 0.1 10*3/uL (ref 0.0–0.7)
Eosinophils Relative: 1 %
HCT: 44 % (ref 36.0–46.0)
HEMOGLOBIN: 14.1 g/dL (ref 12.0–15.0)
LYMPHS PCT: 24 %
Lymphs Abs: 1.8 10*3/uL (ref 0.7–4.0)
MCH: 29.8 pg (ref 26.0–34.0)
MCHC: 32 g/dL (ref 30.0–36.0)
MCV: 93 fL (ref 78.0–100.0)
Monocytes Absolute: 0.6 10*3/uL (ref 0.1–1.0)
Monocytes Relative: 9 %
NEUTROS PCT: 65 %
Neutro Abs: 4.8 10*3/uL (ref 1.7–7.7)
Platelets: 213 10*3/uL (ref 150–400)
RBC: 4.73 MIL/uL (ref 3.87–5.11)
RDW: 13.5 % (ref 11.5–15.5)
WBC: 7.3 10*3/uL (ref 4.0–10.5)

## 2016-09-09 LAB — COMPREHENSIVE METABOLIC PANEL
ALT: 14 U/L (ref 14–54)
AST: 28 U/L (ref 15–41)
Albumin: 3.9 g/dL (ref 3.5–5.0)
Alkaline Phosphatase: 68 U/L (ref 38–126)
Anion gap: 9 (ref 5–15)
BUN: 18 mg/dL (ref 6–20)
CO2: 28 mmol/L (ref 22–32)
Calcium: 9.5 mg/dL (ref 8.9–10.3)
Chloride: 101 mmol/L (ref 101–111)
Creatinine, Ser: 0.88 mg/dL (ref 0.44–1.00)
GFR, EST NON AFRICAN AMERICAN: 57 mL/min — AB (ref >60)
Glucose, Bld: 107 mg/dL — ABNORMAL HIGH (ref 65–99)
Potassium: 4.5 mmol/L (ref 3.5–5.1)
SODIUM: 138 mmol/L (ref 135–145)
Total Bilirubin: 1.2 mg/dL (ref 0.3–1.2)
Total Protein: 7.3 g/dL (ref 6.5–8.1)

## 2016-09-09 LAB — URINALYSIS, ROUTINE W REFLEX MICROSCOPIC
Bilirubin Urine: NEGATIVE
Glucose, UA: NEGATIVE mg/dL
HGB URINE DIPSTICK: NEGATIVE
Ketones, ur: NEGATIVE mg/dL
Leukocytes, UA: NEGATIVE
Nitrite: NEGATIVE
Protein, ur: NEGATIVE mg/dL
Specific Gravity, Urine: 1.008 (ref 1.005–1.030)
pH: 6 (ref 5.0–8.0)

## 2016-09-09 NOTE — Discharge Instructions (Signed)
Return for worsening symptoms.    

## 2016-09-09 NOTE — ED Provider Notes (Signed)
[facility] DEPT Provider Note   CSN: [id number] Arrival date & time: 09/09/16  1258     History   Chief Complaint Chief Complaint  Patient presents with  . Altered Mental Status  . Hypertension    HPI Rachael Perez is a 83 y.o. female who presents with leg weakness. PMH significant for dementia, arthritis, hx of CVA. She lives at [facility] (assisted living). She was eating breakfast this morning and had acute weakness in the legs and wasn't able to stand. The patient denies any symptoms and is unclear why she is here. She reports chronic urinary incontinence. Denies pain. Level 5 caveat due to dementia.  HPI  Past Medical History:  Diagnosis Date  . Abnormal chest CT    Lesion is unchanged; Followed by Dr. Benay Spice  . Anxiety   . Arthritis   . Breast cancer ([place]) 08/1994   Remote lumpectomy on L  . Breast cancer ([place]) 05/2011   R breast with lumpectomy and XRT therapy  . Chronic pain   . Dementia in conditions classified elsewhere without behavioral disturbance   . Hallucinations   . History of medication noncompliance   . Hypertension   . Insomnia, unspecified   . Malignant neoplasm of breast (female), unspecified site   . Other B-complex deficiencies   . Palpitations   . Senile osteoporosis   . SOB (shortness of breath) on exertion   . Varicose veins   . Varicose veins of lower extremities with inflammation   . Vitamin D deficiency   . Wears hearing aid     Patient Active Problem List   Diagnosis Date Noted  . Generalized anxiety disorder 10/07/2015  . Hypertensive urgency 01/31/2014  . Acute encephalopathy 01/31/2014  . UTI (urinary tract infection) 01/31/2014  . Dementia with behavioral disturbance 01/31/2014  . Acute CVA (cerebrovascular accident) ([place]) 01/30/2014  . Altered mental status 01/30/2014  . Insomnia secondary to anxiety 07/27/2013  . Dry mouth 07/27/2013  . Loss of weight 07/27/2013  . Essential hypertension, benign 11/09/2012  .  Senile osteoporosis   . Vitamin D deficiency   . Vascular dementia without behavioral disturbance   . Insomnia, unspecified   . Cancer of upper-outer quadrant of female breast ([place]) 08/24/2011  . History of breast cancer in female 07/31/2011  . Atypical chest pain 06/19/2011  . Elevated blood pressure 12/08/2010  . Abnormal chest CT 12/08/2010  . Chronic anxiety 12/08/2010    Past Surgical History:  Procedure Laterality Date  . BREAST LUMPECTOMY  09/01/2011  . BREAST LUMPECTOMY  1996   left breast  . CATARACT EXTRACTION, BILATERAL    . TONSILLECTOMY    . TUBAL LIGATION  1980  . US ECHOCARDIOGRAPHY  12/17/2009   EF 55-60%  . varicose vein removal  1970    OB History    No data available       Home Medications    Prior to Admission medications   Medication Sig Start Date End Date Taking? Authorizing Provider  aspirin 325 MG tablet Take 1 tablet (325 mg total) by mouth daily. 02/01/14   Kelvin Cellar, MD  mirtazapine (REMERON) 7.5 MG tablet Take 1 tablet (7.5 mg total) by mouth at bedtime. 05/21/16   Gayland Curry, DO    Family History Family History  Problem Relation Age of Onset  . Heart disease Mother   . Heart disease Father   . Heart disease Sister   . Heart disease Brother   . Arthritis Brother   .  Heart disease Sister   . Alzheimer's disease Sister   . Cancer Neg Hx     Social History Social History  Substance Use Topics  . Smoking status: Former Smoker    Types: Cigarettes    Quit date: 04/20/1952  . Smokeless tobacco: Never Used  . Alcohol use Yes     Comment: "I occasionally drink wine."     Allergies   Codeine; Lexapro [escitalopram oxalate]; Lodine [etodolac]; and Namenda [memantine hcl]   Review of Systems Review of Systems  Unable to perform ROS: Dementia     Physical Exam Updated Vital Signs BP (!) 170/81   Pulse 76   Temp 97.5 F (36.4 C) (Oral)   Resp (!) 22   Ht 5\' 7"  (1.702 m)   Wt 54.4 kg (120 lb)   SpO2 99%   BMI  18.79 kg/m   Physical Exam  Constitutional: She appears well-developed and well-nourished. No distress.  Talkative, pleasant  HENT:  Head: Normocephalic and atraumatic.  Hard of hearing  Eyes: Conjunctivae are normal. Pupils are equal, round, and reactive to light. Right eye exhibits no discharge. Left eye exhibits no discharge. No scleral icterus.  Neck: Normal range of motion.  Cardiovascular: Normal rate and regular rhythm.  Exam reveals no gallop and no friction rub.   No murmur heard. Pulmonary/Chest: Effort normal and breath sounds normal. No respiratory distress. She has no wheezes. She has no rales. She exhibits no tenderness.  Abdominal: Soft. Bowel sounds are normal. She exhibits no distension. There is no tenderness.  Neurological: She is alert. She is disoriented. GCS eye subscore is 4. GCS verbal subscore is 5. GCS motor subscore is 6.  Lying on stretcher in NAD. GCS 15. Speaks in a clear voice. Cranial nerves II through XII grossly intact. 5/5 strength in all extremities. Sensation fully intact.  Bilateral finger-to-nose intact. Ambulatory    Skin: Skin is warm and dry.  Psychiatric: She has a normal mood and affect. Her behavior is normal.  Nursing note and vitals reviewed.    ED Treatments / Results  Labs (all labs ordered are listed, but only abnormal results are displayed) Labs Reviewed  COMPREHENSIVE METABOLIC PANEL - Abnormal; Notable for the following:       Result Value   Glucose, Bld 107 (*)    GFR calc non Af Amer 57 (*)    All other components within normal limits  CBC WITH DIFFERENTIAL/PLATELET  URINALYSIS, ROUTINE W REFLEX MICROSCOPIC    EKG  EKG Interpretation  Date/Time:  Wednesday Sep 09 2016 13:47:17 EDT Ventricular Rate:  87 PR Interval:    QRS Duration: 88 QT Interval:  405 QTC Calculation: 488 R Axis:   167 Text Interpretation:  Sinus rhythm Right axis deviation Consider left ventricular hypertrophy Abnrm T, consider ischemia,  anterolateral lds No significant change since last tracing Confirmed by Dorie Rank [phone]) on 09/09/2016 1:56:43 PM       Radiology No results found.  Procedures Procedures (including critical care time)  Medications Ordered in ED Medications - No data to display   Initial Impression / Assessment and Plan / ED Course  I have reviewed the triage vital signs and the nursing notes.  Pertinent labs & imaging results that were available during my care of the patient were reviewed by me and considered in my medical decision making (see chart for details).  83 year old female with dementia. She is hypertensive but vitals signs are otherwise normal. She is very well-appearing. No  focal neuro deficits. She is able to stand and walk to the bathroom in the ED. Labs are unremarkable. UA is clean. Shared visit with Dr. Tomi Bamberger. Will d/c back to assisted living.   Final Clinical Impressions(s) / ED Diagnoses   Final diagnoses:  Hypertension, unspecified type    New Prescriptions New Prescriptions   No medications on file     Iris Pert 09/13/16 1338    Dorie Rank, MD 09/13/16 [phone]

## 2016-09-09 NOTE — ED Triage Notes (Addendum)
Per EMS pt lives in an assisted living where nurse aids noted pt was eating breakfast at 8:00 today when the nurses aids came back to check on her at 10:00 she wasn't able to stand and almost urinated on herself before they could help her to bathroom.  The Nurse aids told EMS this was not her normal.  Per EMS no slurred speech on arrival.  Pt was anxious and stated "she did not want to come to the hospital"  Pt denies any pain. 240/197 BP initially  201/106 BP en route to hospital  Syra Sirmons (Son)  [phone]

## 2016-09-09 NOTE — ED Notes (Signed)
Pt from [place] at [facility]

## 2016-09-24 ENCOUNTER — Ambulatory Visit (INDEPENDENT_AMBULATORY_CARE_PROVIDER_SITE_OTHER): Payer: Medicare Other | Admitting: Podiatry

## 2016-09-24 ENCOUNTER — Encounter: Payer: Self-pay | Admitting: Podiatry

## 2016-09-24 DIAGNOSIS — M79676 Pain in unspecified toe(s): Secondary | ICD-10-CM | POA: Diagnosis not present

## 2016-09-24 DIAGNOSIS — B351 Tinea unguium: Secondary | ICD-10-CM | POA: Diagnosis not present

## 2016-09-24 NOTE — Progress Notes (Signed)
Subjective:     Patient ID: Rachael Perez, female   DOB: 01-25-1927, 83 y.o.   MRN: [id number]  HPIThis patient presents to the office for preventive foot care services.  She says her nails are long and painful.  Her nails are painful walking and wearing her shoes.     Review of Systems     Objective:   Physical Exam GENERAL APPEARANCE: Alert, conversant. Appropriately groomed. No acute distress.  VASCULAR: Pedal pulses are  palpable at  [facility] and PT bilateral.  Capillary refill time is immediate to all digits,  Normal temperature gradient.   NEUROLOGIC: sensation is normal to 5.07 monofilament at 5/5 sites bilateral.  Light touch is intact bilateral, Muscle strength normal.  MUSCULOSKELETAL: acceptable muscle strength, tone and stability bilateral.  Intrinsic muscluature intact bilateral.  Rectus appearance of foot and digits noted bilateral. 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 Hammer toes second B/L.  Tailor bunion 5th MPJ B/L.     DERMATOLOGIC: skin color, texture, and turgor are within normal limits.  No preulcerative lesions or ulcers  are seen, no interdigital maceration noted.  No open lesions present.  . No drainage noted.  No painful callus noted.  Marland KitchenNAILS  Long painful ingrown toenail B/L        Assessment:  Onychomycosis       Plan:  Debridement of nails.   RTC 3 months   Gardiner Barefoot DPM

## 2016-10-14 ENCOUNTER — Other Ambulatory Visit: Payer: Self-pay

## 2016-10-14 NOTE — Telephone Encounter (Signed)
Son [name] left message on triage voice mail, needs Rx for Remeron 7.5. She had a 3 month supply with one refill from 05/21/16, either he or [facility] lost the bottle. Last seen 11/18/15, no appt scheduled. Forward to Dr. Mariea Clonts.

## 2016-10-14 NOTE — Telephone Encounter (Signed)
Pt needs to be seen. Ok to refill as long as appt made and kept.  Give just 30 day supply please.

## 2016-10-15 MED ORDER — MIRTAZAPINE 7.5 MG PO TABS: 7.5000 mg | ORAL_TABLET | Freq: Every day | ORAL | 0 refills | Status: DC

## 2016-10-15 NOTE — Telephone Encounter (Signed)
Patient son, [name] notified and agreed. Appointment scheduled for July 12th. Rx called to [facility] for #30.

## 2016-10-16 DIAGNOSIS — L720 Epidermal cyst: Secondary | ICD-10-CM | POA: Diagnosis not present

## 2016-10-29 ENCOUNTER — Ambulatory Visit: Payer: Medicare Other | Admitting: Internal Medicine

## 2016-12-18 ENCOUNTER — Telehealth: Payer: Self-pay | Admitting: *Deleted

## 2016-12-18 NOTE — Telephone Encounter (Signed)
Son, Dr. Elvin So called and wanted to let you know that the [facility] will be sending some paperwork for you to fill out for patient.

## 2016-12-21 NOTE — Telephone Encounter (Signed)
I recommend she have an appointment.  I won't know what to put on the paperwork at this point.  I have not seen her in over a year.  I suspect that Rachael Perez is unaware of that.  His brother typically arranges the appointments and brings her.

## 2016-12-22 NOTE — Telephone Encounter (Signed)
Dr. Baldo Daub notified. Left message on voicemail with Dr. Cyndi Lennert response.

## 2016-12-25 NOTE — Telephone Encounter (Signed)
Pt's son called and declined pt's appt stating that he would like to bypass an examination and take pt straight to the Social Security administration.

## 2017-01-07 ENCOUNTER — Other Ambulatory Visit: Payer: Self-pay | Admitting: Internal Medicine

## 2017-01-07 NOTE — Telephone Encounter (Signed)
Ok to refill? Has not been seen in over 1 year. Declined last appt.

## 2017-01-07 NOTE — Telephone Encounter (Signed)
Needs appt

## 2017-03-08 ENCOUNTER — Telehealth: Payer: Self-pay | Admitting: *Deleted

## 2017-03-08 NOTE — Telephone Encounter (Signed)
Dr. Baldo Daub called regarding patient. Stated that he had concerns with his mother having confusion at night. Stated that he doesn't think she is taking her medication at night time. Wanted to know when her last refill was. Informed him that we last phoned in 10/15/16 to [facility]. Son hung up quickly. Needs Appointment.   Patient son, Dr. Baldo Daub called back requesting to speak with Dr. Mariea Clonts directly. I did inform him that patient needs an appointment and that it has been over a year since we saw her last (11/18/2015).  Dr. Baldo Daub Wants you to call him at:  [phone].

## 2017-03-09 MED ORDER — MIRTAZAPINE 7.5 MG PO TABS: 7.5000 mg | ORAL_TABLET | Freq: Every day | ORAL | 0 refills | Status: AC

## 2017-03-09 NOTE — Telephone Encounter (Signed)
Fine to refill, but I still need to see her.  I have not gotten to call him yet or the other 2 people requesting to speak with me directly.

## 2017-03-09 NOTE — Telephone Encounter (Signed)
Spoke with Son and he wants Rx faxed to [facility]. Faxed. Son will call back to schedule an appointment next week with Janett Billow.

## 2017-03-09 NOTE — Telephone Encounter (Signed)
Dr. Baldo Daub is calling today requesting a Refill of patient's Remeron 7.5mg  to be called into pharmacy. Stated that patient has not been on it for several weeks and he wants a refill sent to pharmacy so patient can go back to taking it. Please Advise.

## 2017-03-29 ENCOUNTER — Emergency Department (HOSPITAL_COMMUNITY): Payer: Medicare Other

## 2017-03-29 ENCOUNTER — Emergency Department (EMERGENCY_DEPARTMENT_HOSPITAL)
Admission: EM | Admit: 2017-03-29 | Discharge: 2017-03-29 | Disposition: A | Discharge status: Home / Self Care | Payer: Medicare Other | Source: Home / Self Care | Attending: Emergency Medicine | Admitting: Emergency Medicine

## 2017-03-29 ENCOUNTER — Encounter (HOSPITAL_COMMUNITY): Payer: Self-pay

## 2017-03-29 DIAGNOSIS — R339 Retention of urine, unspecified: Secondary | ICD-10-CM | POA: Diagnosis not present

## 2017-03-29 DIAGNOSIS — Z87891 Personal history of nicotine dependence: Secondary | ICD-10-CM | POA: Insufficient documentation

## 2017-03-29 DIAGNOSIS — Z7982 Long term (current) use of aspirin: Secondary | ICD-10-CM | POA: Insufficient documentation

## 2017-03-29 DIAGNOSIS — N319 Neuromuscular dysfunction of bladder, unspecified: Secondary | ICD-10-CM | POA: Diagnosis not present

## 2017-03-29 DIAGNOSIS — Z23 Encounter for immunization: Secondary | ICD-10-CM | POA: Diagnosis not present

## 2017-03-29 DIAGNOSIS — Z853 Personal history of malignant neoplasm of breast: Secondary | ICD-10-CM | POA: Insufficient documentation

## 2017-03-29 DIAGNOSIS — I10 Essential (primary) hypertension: Secondary | ICD-10-CM

## 2017-03-29 DIAGNOSIS — R410 Disorientation, unspecified: Secondary | ICD-10-CM | POA: Diagnosis not present

## 2017-03-29 DIAGNOSIS — Z8673 Personal history of transient ischemic attack (TIA), and cerebral infarction without residual deficits: Secondary | ICD-10-CM | POA: Insufficient documentation

## 2017-03-29 DIAGNOSIS — R404 Transient alteration of awareness: Secondary | ICD-10-CM | POA: Diagnosis not present

## 2017-03-29 DIAGNOSIS — N390 Urinary tract infection, site not specified: Secondary | ICD-10-CM

## 2017-03-29 DIAGNOSIS — R531 Weakness: Secondary | ICD-10-CM | POA: Diagnosis not present

## 2017-03-29 DIAGNOSIS — R29818 Other symptoms and signs involving the nervous system: Secondary | ICD-10-CM | POA: Diagnosis not present

## 2017-03-29 DIAGNOSIS — G9341 Metabolic encephalopathy: Secondary | ICD-10-CM | POA: Diagnosis not present

## 2017-03-29 DIAGNOSIS — N3000 Acute cystitis without hematuria: Secondary | ICD-10-CM | POA: Diagnosis not present

## 2017-03-29 DIAGNOSIS — N179 Acute kidney failure, unspecified: Secondary | ICD-10-CM | POA: Diagnosis not present

## 2017-03-29 LAB — I-STAT CHEM 8, ED
BUN: 23 mg/dL — ABNORMAL HIGH (ref 6–20)
CALCIUM ION: 1.15 mmol/L (ref 1.15–1.40)
CREATININE: 0.8 mg/dL (ref 0.44–1.00)
Chloride: 103 mmol/L (ref 101–111)
Glucose, Bld: 100 mg/dL — ABNORMAL HIGH (ref 65–99)
HEMATOCRIT: 44 % (ref 36.0–46.0)
HEMOGLOBIN: 15 g/dL (ref 12.0–15.0)
Potassium: 4.5 mmol/L (ref 3.5–5.1)
SODIUM: 140 mmol/L (ref 135–145)
TCO2: 28 mmol/L (ref 22–32)

## 2017-03-29 LAB — DIFFERENTIAL
BASOS ABS: 0 10*3/uL (ref 0.0–0.1)
BASOS PCT: 0 %
EOS ABS: 0.1 10*3/uL (ref 0.0–0.7)
Eosinophils Relative: 1 %
LYMPHS ABS: 1.5 10*3/uL (ref 0.7–4.0)
Lymphocytes Relative: 16 %
MONO ABS: 0.6 10*3/uL (ref 0.1–1.0)
MONOS PCT: 7 %
Neutro Abs: 7 10*3/uL (ref 1.7–7.7)
Neutrophils Relative %: 76 %

## 2017-03-29 LAB — CBC
HEMATOCRIT: 43.9 % (ref 36.0–46.0)
HEMOGLOBIN: 14.1 g/dL (ref 12.0–15.0)
MCH: 30.7 pg (ref 26.0–34.0)
MCHC: 32.1 g/dL (ref 30.0–36.0)
MCV: 95.4 fL (ref 78.0–100.0)
Platelets: 248 10*3/uL (ref 150–400)
RBC: 4.6 MIL/uL (ref 3.87–5.11)
RDW: 13.8 % (ref 11.5–15.5)
WBC: 9.2 10*3/uL (ref 4.0–10.5)

## 2017-03-29 LAB — COMPREHENSIVE METABOLIC PANEL
ALT: 10 U/L — ABNORMAL LOW (ref 14–54)
AST: 20 U/L (ref 15–41)
Albumin: 3.5 g/dL (ref 3.5–5.0)
Alkaline Phosphatase: 61 U/L (ref 38–126)
Anion gap: 7 (ref 5–15)
BILIRUBIN TOTAL: 0.8 mg/dL (ref 0.3–1.2)
BUN: 22 mg/dL — ABNORMAL HIGH (ref 6–20)
CO2: 28 mmol/L (ref 22–32)
Calcium: 9.1 mg/dL (ref 8.9–10.3)
Chloride: 103 mmol/L (ref 101–111)
Creatinine, Ser: 0.92 mg/dL (ref 0.44–1.00)
GFR, EST NON AFRICAN AMERICAN: 54 mL/min — AB (ref >60)
GLUCOSE: 100 mg/dL — AB (ref 65–99)
Potassium: 4.4 mmol/L (ref 3.5–5.1)
Sodium: 138 mmol/L (ref 135–145)
Total Protein: 6.8 g/dL (ref 6.5–8.1)

## 2017-03-29 LAB — URINALYSIS, ROUTINE W REFLEX MICROSCOPIC
Bilirubin Urine: NEGATIVE
GLUCOSE, UA: NEGATIVE mg/dL
KETONES UR: NEGATIVE mg/dL
NITRITE: POSITIVE — AB
PROTEIN: NEGATIVE mg/dL
SPECIFIC GRAVITY, URINE: 1.01 (ref 1.005–1.030)
SQUAMOUS EPITHELIAL / LPF: NONE SEEN
pH: 7 (ref 5.0–8.0)

## 2017-03-29 LAB — APTT: aPTT: 28 seconds (ref 24–36)

## 2017-03-29 LAB — CBG MONITORING, ED: GLUCOSE-CAPILLARY: 82 mg/dL (ref 65–99)

## 2017-03-29 LAB — I-STAT TROPONIN, ED: Troponin i, poc: 0.02 ng/mL (ref 0.00–0.08)

## 2017-03-29 LAB — PROTIME-INR
INR: 0.98
Prothrombin Time: 12.9 seconds (ref 11.4–15.2)

## 2017-03-29 MED ORDER — CEPHALEXIN 500 MG PO CAPS: 500.0000 mg | ORAL_CAPSULE | Freq: Three times a day (TID) | ORAL | 0 refills | Status: DC

## 2017-03-29 MED ORDER — SODIUM CHLORIDE 0.9 % IV SOLN
Freq: Once | INTRAVENOUS | Status: AC
  Administered 2017-03-29: 15:00:00 via INTRAVENOUS

## 2017-03-29 MED ORDER — DEXTROSE 5 % IV SOLN
1.0000 g | Freq: Once | INTRAVENOUS | Status: AC
  Administered 2017-03-29: 1 g via INTRAVENOUS
  Filled 2017-03-29: qty 10

## 2017-03-29 NOTE — ED Provider Notes (Signed)
[facility] EMERGENCY DEPARTMENT Provider Note   CSN: [id number] Arrival date & time: 03/29/17  1215   An emergency department physician performed an initial assessment on this suspected stroke patient at 1218.  History   Chief Complaint Chief Complaint  Patient presents with  . Code Stroke    HPI JAVEAH LOEZA is a 83 y.o. female.  HPI  83 y.o. Female with a history of dementia sent from [facility] with report of acute onset of confusion.  They report that she does have baseline dementia but today after her shower was suddenlty unable to remember how to put on make up.  NO lateralized deficit was noted.  Ambulatory status unknown.   Past Medical History:  Diagnosis Date  . Abnormal chest CT    Lesion is unchanged; Followed by Dr. Benay Spice  . Anxiety   . Arthritis   . Breast cancer ([place]) 08/1994   Remote lumpectomy on L  . Breast cancer ([place]) 05/2011   R breast with lumpectomy and XRT therapy  . Chronic pain   . Dementia in conditions classified elsewhere without behavioral disturbance   . Hallucinations   . History of medication noncompliance   . Hypertension   . Insomnia, unspecified   . Malignant neoplasm of breast (female), unspecified site   . Other B-complex deficiencies   . Palpitations   . Senile osteoporosis   . SOB (shortness of breath) on exertion   . Varicose veins   . Varicose veins of lower extremities with inflammation   . Vitamin D deficiency   . Wears hearing aid     Patient Active Problem List   Diagnosis Date Noted  . Generalized anxiety disorder 10/07/2015  . Hypertensive urgency 01/31/2014  . Acute encephalopathy 01/31/2014  . UTI (urinary tract infection) 01/31/2014  . Dementia with behavioral disturbance 01/31/2014  . Acute CVA (cerebrovascular accident) ([place]) 01/30/2014  . Altered mental status 01/30/2014  . Insomnia secondary to anxiety 07/27/2013  . Dry mouth 07/27/2013  . Loss of weight 07/27/2013  . Essential  hypertension, benign 11/09/2012  . Senile osteoporosis   . Vitamin D deficiency   . Vascular dementia without behavioral disturbance   . Insomnia, unspecified   . Cancer of upper-outer quadrant of female breast ([place]) 08/24/2011  . History of breast cancer in female 07/31/2011  . Atypical chest pain 06/19/2011  . Elevated blood pressure 12/08/2010  . Abnormal chest CT 12/08/2010  . Chronic anxiety 12/08/2010    Past Surgical History:  Procedure Laterality Date  . BREAST LUMPECTOMY  09/01/2011  . BREAST LUMPECTOMY  1996   left breast  . CATARACT EXTRACTION, BILATERAL    . TONSILLECTOMY    . TUBAL LIGATION  1980  . US ECHOCARDIOGRAPHY  12/17/2009   EF 55-60%  . varicose vein removal  1970    OB History    No data available       Home Medications    Prior to Admission medications   Medication Sig Start Date End Date Taking? Authorizing Provider  aspirin 325 MG tablet Take 1 tablet (325 mg total) by mouth daily. 02/01/14   Kelvin Cellar, MD  mirtazapine (REMERON) 7.5 MG tablet Take 1 tablet (7.5 mg total) by mouth at bedtime. 03/09/17   Gayland Curry, DO    Family History Family History  Problem Relation Age of Onset  . Heart disease Mother   . Heart disease Father   . Heart disease Sister   . Heart disease Brother   .  Arthritis Brother   . Heart disease Sister   . Alzheimer's disease Sister   . Cancer Neg Hx     Social History Social History   Tobacco Use  . Smoking status: Former Smoker    Types: Cigarettes    Last attempt to quit: 04/20/1952    Years since quitting: 64.9  . Smokeless tobacco: Never Used  Substance Use Topics  . Alcohol use: Yes    Comment: "I occasionally drink wine."  . Drug use: No     Allergies   Codeine; Lexapro [escitalopram oxalate]; Lodine [etodolac]; and Namenda [memantine hcl]   Review of Systems Review of Systems  Unable to perform ROS: Dementia     Physical Exam Updated Vital Signs BP (!) 203/107   Pulse 94    Resp 19   Wt 51.6 kg (113 lb 12.1 oz)   SpO2 93%   BMI 17.82 kg/m   Physical Exam  Constitutional: She appears well-developed and well-nourished.  HENT:  Head: Normocephalic and atraumatic.  Right Ear: External ear normal.  Left Ear: External ear normal.  Mouth/Throat: Oropharynx is clear and moist.  Eyes: Conjunctivae and EOM are normal. Pupils are equal, round, and reactive to light.  Neck: Normal range of motion.  Cardiovascular: Normal rate, regular rhythm, normal heart sounds and intact distal pulses.  Pulmonary/Chest: Effort normal and breath sounds normal.  Abdominal: Soft. Bowel sounds are normal.  Musculoskeletal: Normal range of motion.  Neurological: She is alert. She displays normal reflexes. No cranial nerve deficit or sensory deficit. She exhibits normal muscle tone. Coordination normal.  Oriented to person, not place or date  Skin: Skin is warm. Capillary refill takes less than 2 seconds.  Psychiatric: She has a normal mood and affect.  Nursing note and vitals reviewed.    ED Treatments / Results  Labs (all labs ordered are listed, but only abnormal results are displayed) Labs Reviewed  COMPREHENSIVE METABOLIC PANEL - Abnormal; Notable for the following components:      Result Value   Glucose, Bld 100 (*)    BUN 22 (*)    ALT 10 (*)    GFR calc non Af Amer 54 (*)    All other components within normal limits  I-STAT CHEM 8, ED - Abnormal; Notable for the following components:   BUN 23 (*)    Glucose, Bld 100 (*)    All other components within normal limits  PROTIME-INR  APTT  CBC  DIFFERENTIAL  I-STAT TROPONIN, ED  CBG MONITORING, ED    EKG  EKG Interpretation  Date/Time:  Monday March 29 2017 12:44:00 EST Ventricular Rate:  80 PR Interval:    QRS Duration: 76 QT Interval:  401 QTC Calculation: 463 R Axis:   95 Text Interpretation:  Normal sinus rhythm Non-specific ST-t changes No significant change since last tracing 09 Sep 2016  Confirmed by Pattricia Boss [id number]) on 03/29/2017 2:28:49 PM       Radiology Ct Head Code Stroke Wo Contrast  Result Date: 03/29/2017 CLINICAL DATA:  Code stroke. 83 year old female with slurred speech. Last seen normal 1025 hours. EXAM: CT HEAD WITHOUT CONTRAST TECHNIQUE: Contiguous axial images were obtained from the base of the skull through the vertex without intravenous contrast. COMPARISON:  Brain MRI 01/31/2014, intracranial MRA, and head CT 01/30/2014. FINDINGS: Brain: Decreased cerebral volume since 2015 appears generalized. No midline shift, mass effect, or evidence of intracranial mass lesion. No acute intracranial hemorrhage identified. No ventriculomegaly. Patchy bilateral cerebral white matter hypodensity is  more apparent by CT but appears similar to the MRI appearance in 2015. Heterogeneity in the left thalamus is new since 2015. Other deep gray matter nuclei appear stable. Stable brainstem and cerebellum. No cortical encephalomalacia or acute cortically based infarct identified. Vascular: Extensive Calcified atherosclerosis at the skull base. No suspicious intracranial vascular hyperdensity. Skull: No acute osseous abnormality identified. Sinuses/Orbits: Visualized paranasal sinuses and mastoids are stable and well pneumatized. Other: No acute orbit or scalp soft tissue findings. ASPECTS [facility] Stroke Program Early CT Score) - Ganglionic level infarction (caudate, lentiform nuclei, internal capsule, insula, M1-M3 cortex): 7 - Supraganglionic infarction (M4-M6 cortex): 3 Total score (0-10 with 10 being normal): 10 IMPRESSION: 1. No acute cortically based infarct or acute intracranial hemorrhage identified. 2. ASPECTS is 10. 3. Progression of chronic bilateral white matter disease, and possible new involvement of the left thalamus, since 2015 comparisons. 4. These results were communicated to Dr. Leonel Ramsay at 1241 hours on 03/29/2017 by text page via the [facility] messaging system.  Electronically Signed   By: Genevie Ann M.D.   On: 03/29/2017 12:44    Procedures Procedures (including critical care time)  Medications Ordered in ED Medications - No data to display   Initial Impression / Assessment and Plan / ED Course  I have reviewed the triage vital signs and the nursing notes.  Pertinent labs & imaging results that were available during my care of the patient were reviewed by me and considered in my medical decision making (see chart for details).     83 y.o.female with dementia and altered mental status.  Today she appeared to be more confused than usual abruptly after taking a shower this am.  No lateralized weakness noted. Head ct clear.   Discussed with son, [name] and he advises that he holds poa and is ok with patient being sent back to [facility] if no acute abnormality. Patient with normal labs, head ct clear, and appears at baseline here.  Discussed with son Mikiala Fugett and he agrees with plan.   Urine pending.  Dr. Rex Kras aware and will dc after urine results.  Final Clinical Impressions(s) / ED Diagnoses   Final diagnoses:  Confusion  Hypertension, unspecified type    ED Discharge Orders    None       Pattricia Boss, MD 03/29/17 [phone]

## 2017-03-29 NOTE — ED Triage Notes (Signed)
Pt from [facility] with slurred speech by [facility]. Pt LNW was 1025. Pt demented at baseline

## 2017-03-29 NOTE — ED Notes (Signed)
Pt ambulatory in hallway with assistance; pt tolerated well.

## 2017-03-29 NOTE — Consult Note (Signed)
Requesting Physician: Dr. Jeanell Sparrow    Chief Complaint: Code stroke  History obtained from: Facility patient resides in and EMS  HPI:                                                                                                                                         Rachael Perez is an 83 y.o. female with known history of dementia who resides at [facility] twitched this morning staff noted that patient usually puts on her makeup and is very put together was having difficult time doing so.  They also thought they had noticed some slurred speech so they called EMS.  Due to the slurred speech patient was brought to [facility] for a code stroke.  On arrival patient was very hard of hearing, had difficulty following commands second to both the decreased hearing and her dementia.  She appeared to be dehydrated but did her best to follow all commands and showed no localizing or lateralizing signs.  CT of head was obtained and did not show any intracranial bleed, mass, stroke.  Date last known well: Date: 03/29/2017 Time last known well: Time: 10:45 tPA Given: No: Minimal symptoms, patient has dementia, scored NIH of 2 only because she could not recall her age and the date NIH stroke scale of 2 Modified Rankin: Rankin Score=1    Past Medical History:  Diagnosis Date  . Abnormal chest CT    Lesion is unchanged; Followed by Dr. Benay Spice  . Anxiety   . Arthritis   . Breast cancer ([place]) 08/1994   Remote lumpectomy on L  . Breast cancer ([place]) 05/2011   R breast with lumpectomy and XRT therapy  . Chronic pain   . Dementia in conditions classified elsewhere without behavioral disturbance   . Hallucinations   . History of medication noncompliance   . Hypertension   . Insomnia, unspecified   . Malignant neoplasm of breast (female), unspecified site   . Other B-complex deficiencies   . Palpitations   . Senile osteoporosis   . SOB (shortness of breath) on exertion   . Varicose veins    . Varicose veins of lower extremities with inflammation   . Vitamin D deficiency   . Wears hearing aid     Past Surgical History:  Procedure Laterality Date  . BREAST LUMPECTOMY  09/01/2011  . BREAST LUMPECTOMY  1996   left breast  . CATARACT EXTRACTION, BILATERAL    . TONSILLECTOMY    . TUBAL LIGATION  1980  . US ECHOCARDIOGRAPHY  12/17/2009   EF 55-60%  . varicose vein removal  1970    Family History  Problem Relation Age of Onset  . Heart disease Mother   . Heart disease Father   . Heart disease Sister   . Heart disease Brother   . Arthritis Brother   . Heart disease Sister   . Alzheimer's  disease Sister   . Cancer Neg Hx    Social History:  reports that she quit smoking about 64 years ago. Her smoking use included cigarettes. she has never used smokeless tobacco. She reports that she drinks alcohol. She reports that she does not use drugs.  Allergies:  Allergies  Allergen Reactions  . Codeine Shortness Of Breath  . Lexapro [Escitalopram Oxalate] Other (See Comments)    "Wiped patient out"  . Lodine [Etodolac]   . Namenda [Memantine Hcl] Other (See Comments)    "Wiped patient out"    Medications:                                                                                                                           No current facility-administered medications for this encounter.    Current Outpatient Medications  Medication Sig Dispense Refill  . aspirin 325 MG tablet Take 1 tablet (325 mg total) by mouth daily. 30 tablet 1  . mirtazapine (REMERON) 7.5 MG tablet Take 1 tablet (7.5 mg total) by mouth at bedtime. 30 tablet 0     ROS:                                                                                                                                       History obtained from the patient  General ROS: negative for - chills, fatigue, fever, night sweats, weight gain or weight loss Psychological ROS: negative for - behavioral disorder,  hallucinations, memory difficulties, mood swings or suicidal ideation Ophthalmic ROS: negative for - blurry vision, double vision, eye pain or loss of vision ENT ROS: negative for - epistaxis, nasal discharge, oral lesions, sore throat, tinnitus or vertigo Allergy and Immunology ROS: negative for - hives or itchy/watery eyes Hematological and Lymphatic ROS: negative for - bleeding problems, bruising or swollen lymph nodes Endocrine ROS: negative for - galactorrhea, hair pattern changes, polydipsia/polyuria or temperature intolerance Respiratory ROS: negative for - cough, hemoptysis, shortness of breath or wheezing Cardiovascular ROS: negative for - chest pain, dyspnea on exertion, edema or irregular heartbeat Gastrointestinal ROS: negative for - abdominal pain, diarrhea, hematemesis, nausea/vomiting or stool incontinence Genito-Urinary ROS: negative for - dysuria, hematuria, incontinence or urinary frequency/urgency Musculoskeletal ROS: negative for - joint swelling or muscular weakness Neurological ROS: as noted in HPI Dermatological ROS: negative for rash and skin lesion changes  Neurologic Examination:  Blood pressure (!) 186/78, pulse 79, resp. rate (!) 22, weight 51.6 kg (113 lb 12.1 oz), SpO2 97 %.  HEENT-  Normocephalic, no lesions, without obvious abnormality.  Normal external eye and conjunctiva.  Normal TM's bilaterally.  Normal auditory canals and external ears.  mucus membranes were dry.  Normal pharynx. Cardiovascular- S1, S2 normal, pulses palpable throughout   Lungs- chest clear, no wheezing, rales, normal symmetric air entry Abdomen- normal findings: bowel sounds normal Extremities- no edema Lymph-no adenopathy palpable Musculoskeletal-no joint tenderness, deformity or swelling Skin-warm and dry, no hyperpigmentation, vitiligo, or suspicious lesions  Neurological  Examination Mental Status: Alert, was unable to tell me her age however she said 22 and she is 63, did not know the month was aware she was in the hospital..  Speech slightly dysarthric however she had a fissured tongue and dry mouth  without evidence of aphasia.  Able to follow simple two-step commands commands with some difficulty. Cranial Nerves: II: Discs flat bilaterally; Visual fields grossly normal,  III,IV, VI: ptosis not present, extra-ocular motions intact bilaterally, pupils equal, round, reactive to light and accommodation V,VII: smile symmetric, facial light touch sensation normal bilaterally VIII: hearing normal bilaterally IX,X: uvula rises symmetrically XI: bilateral shoulder shrug XII: midline tongue extension Motor: Right : Upper extremity   5/5    Left:     Upper extremity   5/5  Lower extremity   5/5     Lower extremity   5/5 Tone and bulk:normal tone throughout; no atrophy noted Sensory: Pinprick and light touch intact throughout, bilaterally Deep Tendon Reflexes: 1+ and symmetric throughout Plantars: Right: downgoing   Left: downgoing Cerebellar: normal finger-to-nose,  and normal heel-to-shin test Gait: Not tested       Lab Results: Basic Metabolic Panel: No results for input(s): NA, K, CL, CO2, GLUCOSE, BUN, CREATININE, CALCIUM, MG, PHOS in the last 168 hours.  Liver Function Tests: No results for input(s): AST, ALT, ALKPHOS, BILITOT, PROT, ALBUMIN in the last 168 hours. No results for input(s): LIPASE, AMYLASE in the last 168 hours. No results for input(s): AMMONIA in the last 168 hours.  CBC: Recent Labs  Lab 03/29/17 1223  WBC 9.2  NEUTROABS 7.0  HGB 14.1  HCT 43.9  MCV 95.4  PLT 248    Cardiac Enzymes: No results for input(s): CKTOTAL, CKMB, CKMBINDEX, TROPONINI in the last 168 hours.  Lipid Panel: No results for input(s): CHOL, TRIG, HDL, CHOLHDL, VLDL, LDLCALC in the last 168 hours.  CBG: No results for input(s): GLUCAP in the last  168 hours.  Microbiology: Results for orders placed or performed in visit on 10/19/14  Culture, Urine     Status: None   Collection Time: 10/19/14  3:15 PM  Result Value Ref Range Status   Urine Culture, Routine Final report  Final   Organism ID, Bacteria Comment  Final    Comment: Mixed urogenital flora Less than 10,000 colonies/mL     Coagulation Studies: No results for input(s): LABPROT, INR in the last 72 hours.  Imaging: Ct Head Code Stroke Wo Contrast  Result Date: 03/29/2017 CLINICAL DATA:  Code stroke. 83 year old female with slurred speech. Last seen normal 1025 hours. EXAM: CT HEAD WITHOUT CONTRAST TECHNIQUE: Contiguous axial images were obtained from the base of the skull through the vertex without intravenous contrast. COMPARISON:  Brain MRI 01/31/2014, intracranial MRA, and head CT 01/30/2014. FINDINGS: Brain: Decreased cerebral volume since 2015 appears generalized. No midline shift, mass effect, or evidence of intracranial mass lesion. No  acute intracranial hemorrhage identified. No ventriculomegaly. Patchy bilateral cerebral white matter hypodensity is more apparent by CT but appears similar to the MRI appearance in 2015. Heterogeneity in the left thalamus is new since 2015. Other deep gray matter nuclei appear stable. Stable brainstem and cerebellum. No cortical encephalomalacia or acute cortically based infarct identified. Vascular: Extensive Calcified atherosclerosis at the skull base. No suspicious intracranial vascular hyperdensity. Skull: No acute osseous abnormality identified. Sinuses/Orbits: Visualized paranasal sinuses and mastoids are stable and well pneumatized. Other: No acute orbit or scalp soft tissue findings. ASPECTS [facility] Stroke Program Early CT Score) - Ganglionic level infarction (caudate, lentiform nuclei, internal capsule, insula, M1-M3 cortex): 7 - Supraganglionic infarction (M4-M6 cortex): 3 Total score (0-10 with 10 being normal): 10 IMPRESSION: 1. No  acute cortically based infarct or acute intracranial hemorrhage identified. 2. ASPECTS is 10. 3. Progression of chronic bilateral white matter disease, and possible new involvement of the left thalamus, since 2015 comparisons. 4. These results were communicated to Dr. Leonel Ramsay at 1241 hours on 03/29/2017 by text page via the [facility] messaging system. Electronically Signed   By: Genevie Ann M.D.   On: 03/29/2017 12:44       Assessment and plan discussed with with attending physician and they are in agreement.    Etta Quill PA-C [facility] [phone]  03/29/2017, 12:48 PM   Assessment: 83 y.o. female with mild worsening of confusion in the setting of dementia. She did fall and hit her head yesterday, unclear if mild TBI could be playing a role, though with a negative head CT I don't think that we would change management at all based on this. There is no lateralizing or localizing symptoms to suggest stroke.   Stroke Risk Factors - hypertension  1) Assess for possible other contributing factors(e.g. UTI, PNA, etc) 2) If stable, I would favor her following up as outpatient with PCP. If she continues to have a marked difference than baseline, could consider MRI.  3) Please call with any further questions or concerns.   Roland Rack, MD [facility] [phone]  If 7pm- 7am, please page neurology on call as listed in [place].

## 2017-03-29 NOTE — ED Notes (Signed)
Son is taking patient back to [facility]

## 2017-03-29 NOTE — Discharge Instructions (Addendum)
Recheck blood pressure with doctor this week. Return if worse at any time.  Take all antibiotics as prescribed.   Recheck with your doctor for urinalysis recheck.

## 2017-03-30 ENCOUNTER — Encounter (HOSPITAL_COMMUNITY): Payer: Self-pay | Admitting: *Deleted

## 2017-03-30 ENCOUNTER — Other Ambulatory Visit: Payer: Self-pay

## 2017-03-30 ENCOUNTER — Telehealth: Payer: Self-pay | Admitting: *Deleted

## 2017-03-30 ENCOUNTER — Inpatient Hospital Stay (HOSPITAL_COMMUNITY)
Admission: EM | Admit: 2017-03-30 | Discharge: 2017-04-02 | DRG: 689 | Disposition: A | Discharge status: Skilled Nursing Facility | Payer: Medicare Other | Attending: Internal Medicine | Admitting: Internal Medicine

## 2017-03-30 DIAGNOSIS — S0990XA Unspecified injury of head, initial encounter: Secondary | ICD-10-CM | POA: Diagnosis not present

## 2017-03-30 DIAGNOSIS — N319 Neuromuscular dysfunction of bladder, unspecified: Secondary | ICD-10-CM | POA: Diagnosis present

## 2017-03-30 DIAGNOSIS — Z79899 Other long term (current) drug therapy: Secondary | ICD-10-CM | POA: Diagnosis not present

## 2017-03-30 DIAGNOSIS — N289 Disorder of kidney and ureter, unspecified: Secondary | ICD-10-CM | POA: Diagnosis not present

## 2017-03-30 DIAGNOSIS — Z885 Allergy status to narcotic agent status: Secondary | ICD-10-CM | POA: Diagnosis not present

## 2017-03-30 DIAGNOSIS — R109 Unspecified abdominal pain: Secondary | ICD-10-CM

## 2017-03-30 DIAGNOSIS — G9341 Metabolic encephalopathy: Secondary | ICD-10-CM | POA: Diagnosis not present

## 2017-03-30 DIAGNOSIS — Z66 Do not resuscitate: Secondary | ICD-10-CM | POA: Diagnosis present

## 2017-03-30 DIAGNOSIS — Z888 Allergy status to other drugs, medicaments and biological substances status: Secondary | ICD-10-CM

## 2017-03-30 DIAGNOSIS — Z974 Presence of external hearing-aid: Secondary | ICD-10-CM

## 2017-03-30 DIAGNOSIS — M810 Age-related osteoporosis without current pathological fracture: Secondary | ICD-10-CM | POA: Diagnosis present

## 2017-03-30 DIAGNOSIS — R339 Retention of urine, unspecified: Secondary | ICD-10-CM | POA: Diagnosis present

## 2017-03-30 DIAGNOSIS — N179 Acute kidney failure, unspecified: Secondary | ICD-10-CM | POA: Diagnosis present

## 2017-03-30 DIAGNOSIS — N390 Urinary tract infection, site not specified: Secondary | ICD-10-CM | POA: Diagnosis present

## 2017-03-30 DIAGNOSIS — E876 Hypokalemia: Secondary | ICD-10-CM | POA: Diagnosis present

## 2017-03-30 DIAGNOSIS — Z853 Personal history of malignant neoplasm of breast: Secondary | ICD-10-CM

## 2017-03-30 DIAGNOSIS — R402441 Other coma, without documented Glasgow coma scale score, or with partial score reported, in the field [EMT or ambulance]: Secondary | ICD-10-CM | POA: Diagnosis not present

## 2017-03-30 DIAGNOSIS — H9190 Unspecified hearing loss, unspecified ear: Secondary | ICD-10-CM | POA: Diagnosis present

## 2017-03-30 DIAGNOSIS — F329 Major depressive disorder, single episode, unspecified: Secondary | ICD-10-CM | POA: Diagnosis not present

## 2017-03-30 DIAGNOSIS — Z23 Encounter for immunization: Secondary | ICD-10-CM

## 2017-03-30 DIAGNOSIS — R278 Other lack of coordination: Secondary | ICD-10-CM | POA: Diagnosis not present

## 2017-03-30 DIAGNOSIS — M6281 Muscle weakness (generalized): Secondary | ICD-10-CM | POA: Diagnosis not present

## 2017-03-30 DIAGNOSIS — R2689 Other abnormalities of gait and mobility: Secondary | ICD-10-CM | POA: Diagnosis not present

## 2017-03-30 DIAGNOSIS — R627 Adult failure to thrive: Secondary | ICD-10-CM | POA: Diagnosis present

## 2017-03-30 DIAGNOSIS — K5909 Other constipation: Secondary | ICD-10-CM | POA: Diagnosis not present

## 2017-03-30 DIAGNOSIS — F0150 Vascular dementia without behavioral disturbance: Secondary | ICD-10-CM | POA: Diagnosis present

## 2017-03-30 DIAGNOSIS — I10 Essential (primary) hypertension: Secondary | ICD-10-CM | POA: Diagnosis present

## 2017-03-30 DIAGNOSIS — F411 Generalized anxiety disorder: Secondary | ICD-10-CM | POA: Diagnosis present

## 2017-03-30 DIAGNOSIS — Z87891 Personal history of nicotine dependence: Secondary | ICD-10-CM | POA: Diagnosis not present

## 2017-03-30 DIAGNOSIS — N3000 Acute cystitis without hematuria: Secondary | ICD-10-CM

## 2017-03-30 DIAGNOSIS — E860 Dehydration: Secondary | ICD-10-CM | POA: Diagnosis present

## 2017-03-30 DIAGNOSIS — R1312 Dysphagia, oropharyngeal phase: Secondary | ICD-10-CM | POA: Diagnosis not present

## 2017-03-30 DIAGNOSIS — R4182 Altered mental status, unspecified: Secondary | ICD-10-CM | POA: Diagnosis not present

## 2017-03-30 LAB — COMPREHENSIVE METABOLIC PANEL
ALBUMIN: 3.5 g/dL (ref 3.5–5.0)
ALK PHOS: 61 U/L (ref 38–126)
ALT: 11 U/L — AB (ref 14–54)
ANION GAP: 10 (ref 5–15)
AST: 19 U/L (ref 15–41)
BILIRUBIN TOTAL: 0.4 mg/dL (ref 0.3–1.2)
BUN: 30 mg/dL — ABNORMAL HIGH (ref 6–20)
CALCIUM: 9.2 mg/dL (ref 8.9–10.3)
CHLORIDE: 105 mmol/L (ref 101–111)
CO2: 26 mmol/L (ref 22–32)
Creatinine, Ser: 1.08 mg/dL — ABNORMAL HIGH (ref 0.44–1.00)
GFR calc non Af Amer: 44 mL/min — ABNORMAL LOW (ref >60)
GFR, EST AFRICAN AMERICAN: 52 mL/min — AB (ref >60)
Glucose, Bld: 105 mg/dL — ABNORMAL HIGH (ref 65–99)
POTASSIUM: 4 mmol/L (ref 3.5–5.1)
SODIUM: 141 mmol/L (ref 135–145)
Total Protein: 6.9 g/dL (ref 6.5–8.1)

## 2017-03-30 LAB — URINALYSIS, COMPLETE (UACMP) WITH MICROSCOPIC
BILIRUBIN URINE: NEGATIVE
Glucose, UA: NEGATIVE mg/dL
KETONES UR: NEGATIVE mg/dL
NITRITE: NEGATIVE
PROTEIN: 30 mg/dL — AB
Specific Gravity, Urine: 1.015 (ref 1.005–1.030)
pH: 5 (ref 5.0–8.0)

## 2017-03-30 LAB — CBC WITH DIFFERENTIAL/PLATELET
BASOS PCT: 0 %
Basophils Absolute: 0 10*3/uL (ref 0.0–0.1)
Eosinophils Absolute: 0.1 10*3/uL (ref 0.0–0.7)
Eosinophils Relative: 1 %
HEMATOCRIT: 42.8 % (ref 36.0–46.0)
HEMOGLOBIN: 14 g/dL (ref 12.0–15.0)
Lymphocytes Relative: 15 %
Lymphs Abs: 1.4 10*3/uL (ref 0.7–4.0)
MCH: 30.7 pg (ref 26.0–34.0)
MCHC: 32.7 g/dL (ref 30.0–36.0)
MCV: 93.9 fL (ref 78.0–100.0)
MONOS PCT: 9 %
Monocytes Absolute: 0.8 10*3/uL (ref 0.1–1.0)
NEUTROS ABS: 7.1 10*3/uL (ref 1.7–7.7)
NEUTROS PCT: 75 %
Platelets: 253 10*3/uL (ref 150–400)
RBC: 4.56 MIL/uL (ref 3.87–5.11)
RDW: 13.7 % (ref 11.5–15.5)
WBC: 9.5 10*3/uL (ref 4.0–10.5)

## 2017-03-30 LAB — I-STAT CG4 LACTIC ACID, ED: LACTIC ACID, VENOUS: 1.35 mmol/L (ref 0.5–1.9)

## 2017-03-30 LAB — GLUCOSE, CAPILLARY: GLUCOSE-CAPILLARY: 76 mg/dL (ref 65–99)

## 2017-03-30 LAB — URINE CULTURE
Culture: NO GROWTH
SPECIAL REQUESTS: NORMAL

## 2017-03-30 LAB — CBG MONITORING, ED: Glucose-Capillary: 94 mg/dL (ref 65–99)

## 2017-03-30 MED ORDER — SODIUM CHLORIDE 0.9 % IV BOLUS (SEPSIS)
1000.0000 mL | Freq: Once | INTRAVENOUS | Status: AC
  Administered 2017-03-30: 1000 mL via INTRAVENOUS

## 2017-03-30 MED ORDER — SODIUM CHLORIDE 0.9 % IV SOLN
INTRAVENOUS | Status: DC
  Administered 2017-03-30 – 2017-04-02 (×4): via INTRAVENOUS

## 2017-03-30 MED ORDER — INSULIN ASPART 100 UNIT/ML ~~LOC~~ SOLN
0.0000 [IU] | Freq: Three times a day (TID) | SUBCUTANEOUS | Status: DC
  Administered 2017-03-31 – 2017-04-01 (×2): 1 [IU] via SUBCUTANEOUS

## 2017-03-30 MED ORDER — HEPARIN SODIUM (PORCINE) 5000 UNIT/ML IJ SOLN
5000.0000 [IU] | Freq: Three times a day (TID) | INTRAMUSCULAR | Status: DC
  Administered 2017-03-30 – 2017-04-02 (×9): 5000 [IU] via SUBCUTANEOUS
  Filled 2017-03-30 (×9): qty 1

## 2017-03-30 MED ORDER — DEXTROSE 5 % IV SOLN
1.0000 g | INTRAVENOUS | Status: DC
  Administered 2017-03-31 – 2017-04-01 (×2): 1 g via INTRAVENOUS
  Filled 2017-03-30 (×3): qty 10

## 2017-03-30 MED ORDER — ACETAMINOPHEN 325 MG PO TABS
650.0000 mg | ORAL_TABLET | Freq: Four times a day (QID) | ORAL | Status: DC | PRN
  Administered 2017-04-01: 650 mg via ORAL
  Filled 2017-03-30: qty 2

## 2017-03-30 MED ORDER — ASPIRIN 325 MG PO TABS
325.0000 mg | ORAL_TABLET | Freq: Every day | ORAL | Status: DC
  Administered 2017-03-30 – 2017-04-02 (×4): 325 mg via ORAL
  Filled 2017-03-30 (×4): qty 1

## 2017-03-30 MED ORDER — CEFTRIAXONE SODIUM 1 G IJ SOLR
1.0000 g | Freq: Once | INTRAMUSCULAR | Status: AC
  Administered 2017-03-30: 1 g via INTRAVENOUS
  Filled 2017-03-30: qty 10

## 2017-03-30 MED ORDER — ACETAMINOPHEN 650 MG RE SUPP: 650.0000 mg | Freq: Four times a day (QID) | RECTAL | Status: DC | PRN

## 2017-03-30 MED ORDER — SENNOSIDES-DOCUSATE SODIUM 8.6-50 MG PO TABS: 1.0000 | ORAL_TABLET | Freq: Every evening | ORAL | Status: DC | PRN

## 2017-03-30 MED ORDER — SODIUM CHLORIDE 0.9 % IV SOLN
INTRAVENOUS | Status: DC
  Administered 2017-03-30: 18:00:00 via INTRAVENOUS

## 2017-03-30 MED ORDER — MIRTAZAPINE 15 MG PO TABS
7.5000 mg | ORAL_TABLET | Freq: Every day | ORAL | Status: DC
  Administered 2017-03-30 – 2017-04-01 (×3): 7.5 mg via ORAL
  Filled 2017-03-30 (×3): qty 1

## 2017-03-30 NOTE — Patient Outreach (Signed)
[name]) Care Management  03/30/2017  Rachael Perez 19-Apr-1927 [id number]   1st Telephone call to patient for ED Utilization screening.  No answer.  HIPAA complaint voice message left with contact information.    Plan: RN Health Coach will make an outreach attempt to the patient within three business days.  Lazaro Arms RN, BSN, [place] Direct Dial:  [phone] Fax: [phone]

## 2017-03-30 NOTE — Telephone Encounter (Signed)
Son calling with concerns about her mother and wanted to speak with Dr. Mariea Clonts directly. I advised to son that his mother hasn't been seen by Dr. Mariea Clonts since 11/18/2015, son states that shouldn't matter, he needs to speak with her. [phone] Dr. Elvin So

## 2017-03-30 NOTE — ED Notes (Signed)
Pt continues to take out purewick catheter and strip off clothes. RN and other nursing staff try to check on patient as frequently as possible. Pt continues to try to scoot to the end of the bed and get out of bed.  Pt repeatedly saying "I was born in [place]" and asking nurse "Please stay with me". Pt has once again removed yellow socks. RN replaced socks at this time

## 2017-03-30 NOTE — ED Provider Notes (Addendum)
[facility] DEPT Provider Note   CSN: [id number] Arrival date & time: 03/30/17  1710     History   Chief Complaint Chief Complaint  Patient presents with  . Altered Mental Status    HPI Rachael Perez is a 83 y.o. female.  Pt presents to the ED today with AMS.  The pt was seen at [facility] yesterday for a code stroke.  The pt was seen by neurology who did not feel like it was a stroke.  She was diagnosed with an UTI and sent back to assisted living.  The pt was sent back to the hospital today because she is not any better.  Pt has been less alert since yesterday.  She is unable to give me any history.  Urine culture from yesterday showed no growth.      Past Medical History:  Diagnosis Date  . Abnormal chest CT    Lesion is unchanged; Followed by Dr. Benay Spice  . Anxiety   . Arthritis   . Breast cancer ([place]) 08/1994   Remote lumpectomy on L  . Breast cancer ([place]) 05/2011   R breast with lumpectomy and XRT therapy  . Chronic pain   . Dementia in conditions classified elsewhere without behavioral disturbance   . Hallucinations   . History of medication noncompliance   . Hypertension   . Insomnia, unspecified   . Malignant neoplasm of breast (female), unspecified site   . Other B-complex deficiencies   . Palpitations   . Senile osteoporosis   . SOB (shortness of breath) on exertion   . Varicose veins   . Varicose veins of lower extremities with inflammation   . Vitamin D deficiency   . Wears hearing aid     Patient Active Problem List   Diagnosis Date Noted  . Generalized anxiety disorder 10/07/2015  . Hypertensive urgency 01/31/2014  . Acute encephalopathy 01/31/2014  . UTI (urinary tract infection) 01/31/2014  . Dementia with behavioral disturbance 01/31/2014  . Acute CVA (cerebrovascular accident) ([place]) 01/30/2014  . Altered mental status 01/30/2014  . Insomnia secondary to anxiety 07/27/2013  . Dry mouth 07/27/2013  . Loss of  weight 07/27/2013  . Essential hypertension, benign 11/09/2012  . Senile osteoporosis   . Vitamin D deficiency   . Vascular dementia without behavioral disturbance   . Insomnia, unspecified   . Cancer of upper-outer quadrant of female breast ([place]) 08/24/2011  . History of breast cancer in female 07/31/2011  . Atypical chest pain 06/19/2011  . Elevated blood pressure 12/08/2010  . Abnormal chest CT 12/08/2010  . Chronic anxiety 12/08/2010    Past Surgical History:  Procedure Laterality Date  . BREAST LUMPECTOMY  09/01/2011  . BREAST LUMPECTOMY  1996   left breast  . CATARACT EXTRACTION, BILATERAL    . TONSILLECTOMY    . TUBAL LIGATION  1980  . US ECHOCARDIOGRAPHY  12/17/2009   EF 55-60%  . varicose vein removal  1970    OB History    No data available       Home Medications    Prior to Admission medications   Medication Sig Start Date End Date Taking? Authorizing Provider  aspirin 325 MG tablet Take 1 tablet (325 mg total) by mouth daily. Patient not taking: Reported on 03/29/2017 02/01/14   Kelvin Cellar, MD  cephALEXin (KEFLEX) 500 MG capsule Take 1 capsule (500 mg total) by mouth 3 (three) times daily. 03/29/17   Pattricia Boss, MD  mirtazapine (REMERON) 7.5 MG  tablet Take 1 tablet (7.5 mg total) by mouth at bedtime. 03/09/17   Gayland Curry, DO    Family History Family History  Problem Relation Age of Onset  . Heart disease Mother   . Heart disease Father   . Heart disease Sister   . Heart disease Brother   . Arthritis Brother   . Heart disease Sister   . Alzheimer's disease Sister   . Cancer Neg Hx     Social History Social History   Tobacco Use  . Smoking status: Former Smoker    Types: Cigarettes    Last attempt to quit: 04/20/1952    Years since quitting: 64.9  . Smokeless tobacco: Never Used  Substance Use Topics  . Alcohol use: Yes    Comment: "I occasionally drink wine."  . Drug use: No     Allergies   Codeine; Lexapro [escitalopram  oxalate]; Lodine [etodolac]; and Namenda [memantine hcl]   Review of Systems Review of Systems  Unable to perform ROS: Dementia     Physical Exam Updated Vital Signs BP (!) 153/112   Pulse (!) 107   Temp 97.7 F (36.5 C) (Oral)   Resp 20   Wt 51.3 kg (113 lb)   SpO2 97%   BMI 17.70 kg/m   Physical Exam  Constitutional: She appears well-developed and well-nourished.  HENT:  Head: Normocephalic and atraumatic.  Right Ear: External ear normal.  Left Ear: External ear normal.  Nose: Nose normal.  Mouth/Throat: Oropharynx is clear and moist.  Eyes: Conjunctivae and EOM are normal. Pupils are equal, round, and reactive to light.  Neck: Normal range of motion. Neck supple.  Cardiovascular: Normal rate, regular rhythm, normal heart sounds and intact distal pulses.  Pulmonary/Chest: Effort normal and breath sounds normal.  Abdominal: Soft. Bowel sounds are normal.  Musculoskeletal: Normal range of motion.  Neurological: She is alert.  Skin: Skin is warm. Capillary refill takes less than 2 seconds.  Psychiatric: She has a normal mood and affect. Her behavior is normal. Judgment and thought content normal.  Nursing note and vitals reviewed.    ED Treatments / Results  Labs (all labs ordered are listed, but only abnormal results are displayed) Labs Reviewed  COMPREHENSIVE METABOLIC PANEL - Abnormal; Notable for the following components:      Result Value   Glucose, Bld 105 (*)    BUN 30 (*)    Creatinine, Ser 1.08 (*)    ALT 11 (*)    GFR calc non Af Amer 44 (*)    GFR calc Af Amer 52 (*)    All other components within normal limits  URINALYSIS, COMPLETE (UACMP) WITH MICROSCOPIC - Abnormal; Notable for the following components:   Hgb urine dipstick SMALL (*)    Protein, ur 30 (*)    Leukocytes, UA MODERATE (*)    Bacteria, UA RARE (*)    Squamous Epithelial / LPF 0-5 (*)    All other components within normal limits  URINE CULTURE  CBC WITH DIFFERENTIAL/PLATELET    CBG MONITORING, ED  I-STAT CG4 LACTIC ACID, ED    EKG  EKG Interpretation None       Radiology Ct Head Code Stroke Wo Contrast  Result Date: 03/29/2017 CLINICAL DATA:  Code stroke. 83 year old female with slurred speech. Last seen normal 1025 hours. EXAM: CT HEAD WITHOUT CONTRAST TECHNIQUE: Contiguous axial images were obtained from the base of the skull through the vertex without intravenous contrast. COMPARISON:  Brain MRI 01/31/2014, intracranial MRA,  and head CT 01/30/2014. FINDINGS: Brain: Decreased cerebral volume since 2015 appears generalized. No midline shift, mass effect, or evidence of intracranial mass lesion. No acute intracranial hemorrhage identified. No ventriculomegaly. Patchy bilateral cerebral white matter hypodensity is more apparent by CT but appears similar to the MRI appearance in 2015. Heterogeneity in the left thalamus is new since 2015. Other deep gray matter nuclei appear stable. Stable brainstem and cerebellum. No cortical encephalomalacia or acute cortically based infarct identified. Vascular: Extensive Calcified atherosclerosis at the skull base. No suspicious intracranial vascular hyperdensity. Skull: No acute osseous abnormality identified. Sinuses/Orbits: Visualized paranasal sinuses and mastoids are stable and well pneumatized. Other: No acute orbit or scalp soft tissue findings. ASPECTS [facility] Stroke Program Early CT Score) - Ganglionic level infarction (caudate, lentiform nuclei, internal capsule, insula, M1-M3 cortex): 7 - Supraganglionic infarction (M4-M6 cortex): 3 Total score (0-10 with 10 being normal): 10 IMPRESSION: 1. No acute cortically based infarct or acute intracranial hemorrhage identified. 2. ASPECTS is 10. 3. Progression of chronic bilateral white matter disease, and possible new involvement of the left thalamus, since 2015 comparisons. 4. These results were communicated to Dr. Leonel Ramsay at 1241 hours on 03/29/2017 by text page via the [employer]  messaging system. Electronically Signed   By: Genevie Ann M.D.   On: 03/29/2017 12:44    Procedures Procedures (including critical care time)  Medications Ordered in ED Medications  sodium chloride 0.9 % bolus 1,000 mL (0 mLs Intravenous Stopped 03/30/17 1920)    And  0.9 %  sodium chloride infusion ( Intravenous New Bag/Given 03/30/17 1758)  cefTRIAXone (ROCEPHIN) 1 g in dextrose 5 % 50 mL IVPB (not administered)  cefTRIAXone (ROCEPHIN) 1 g in dextrose 5 % 50 mL IVPB (0 g Intravenous Stopped 03/30/17 2118)     Initial Impression / Assessment and Plan / ED Course  I have reviewed the triage vital signs and the nursing notes.  Pertinent labs & imaging results that were available during my care of the patient were reviewed by me and considered in my medical decision making (see chart for details).    Pt is very confused.  The nurse said she got out of bed and escaped to the hall naked.  The pt is not safe for assisted living.  I am not sure if the urine culture was done before or after she had abx.  I gave her rocephin again here.  Pt d/w Dr. Reesa Chew (hospitalist) for admission.  Pt's son Dr. Elvin So updated on pt's condition.  He requests to be called if there is any change and for an update in the morning.  [phone].  Final Clinical Impressions(s) / ED Diagnoses   Final diagnoses:  Metabolic encephalopathy  Acute cystitis without hematuria    ED Discharge Orders    None       Isla Pence, MD 03/30/17 2045    Isla Pence, MD 03/30/17 2145

## 2017-03-30 NOTE — ED Triage Notes (Signed)
Pt from [facility] with confusion. Pt was seen yesterday at [facility]. Pt is slightly more lethargic than yesterday per EMS. Pt was given IV antibiotics yesterday and given a prescription for medication. She just got the prescription filled today and has not started. Staff sent pt out to be evaluated.  Last Vitals 194/91 BP 86HR  87 CBG 98.7 F 95% SPO2 on RA

## 2017-03-30 NOTE — Progress Notes (Signed)
Pharmacy Antibiotic Note  Rachael Perez is a 83 y.o. female admitted on 03/30/2017 with UTI.  Pharmacy has been consulted for Rocephin dosing.  Plan: Rocephin 1 gm IV q24 Pharmacy to sign off  Weight: 113 lb (51.3 kg)  Temp (24hrs), Avg:97.7 F (36.5 C), Min:97.7 F (36.5 C), Max:97.7 F (36.5 C)  Recent Labs  Lab 03/29/17 1223 03/29/17 1238 03/30/17 1748 03/30/17 1801  WBC 9.2  --  9.5  --   CREATININE 0.92 0.80 1.08*  --   LATICACIDVEN  --   --   --  1.35    CrCl cannot be calculated (Unknown ideal weight.).    Allergies  Allergen Reactions  . Codeine Shortness Of Breath  . Lexapro [Escitalopram Oxalate] Other (See Comments)    "Wiped patient out"  . Lodine [Etodolac]   . Namenda [Memantine Hcl] Other (See Comments)    "Wiped patient out"    Thank you for allowing pharmacy to be a part of this patient's care. Eudelia Bunch, Pharm.D. [phone] 03/30/2017 9:16 PM

## 2017-03-30 NOTE — H&P (Signed)
History and Physical    Rachael Perez IEP:[id number] DOB: [birth year] DOA: 03/30/2017  PCP: Gayland Curry, DO Patient coming from: Assisted Living  Chief Complaint: AMS  HPI: Rachael Perez is a 83 y.o. female with medical history significant of vascular dementia, depression, general anxiety disorder came to the hospital for evaluation of change in mental status.  Patient has advanced dementia and currently more altered than baseline due to underlying infection therefore history is limited.  No family members at bedside.  Per the ED staff patient was acting unusual yesterday with questionable slurred speech therefore she was brought to [facility] ER.  CT of the head was negative and she was diagnosed with urinary tract infection.  After getting IV Rocephin she was sent back to the nursing facility on Keflex.  Also at [facility] she was evaluated by neurology who did not think patient was having stroke as her symptoms were secondary to urinary tract infection. After going back to the facility patient continued to remain altered and it does not appear that the prescription for Keflex was failed.  Due to persistent altered mental status and unable to ambulate much she was brought back to the hospital again.  During this time she is also had poor intake.  In the ER she was noted has dirty UA. Her mentation was AAOx 1 but was told her baseline is AAOx2 and able to perform some basic task on her own.  Due to poor oral intake she was started on IV antibiotics here along with fluids and admitted for further treatment.   Review of Systems: As per HPI otherwise 10 point review of systems negative.   Past Medical History:  Diagnosis Date  . Abnormal chest CT    Lesion is unchanged; Followed by Dr. Benay Spice  . Anxiety   . Arthritis   . Breast cancer ([place]) 08/1994   Remote lumpectomy on L  . Breast cancer ([place]) 05/2011   R breast with lumpectomy and XRT therapy  . Chronic pain   .  Dementia in conditions classified elsewhere without behavioral disturbance   . Hallucinations   . History of medication noncompliance   . Hypertension   . Insomnia, unspecified   . Malignant neoplasm of breast (female), unspecified site   . Other B-complex deficiencies   . Palpitations   . Senile osteoporosis   . SOB (shortness of breath) on exertion   . Varicose veins   . Varicose veins of lower extremities with inflammation   . Vitamin D deficiency   . Wears hearing aid     Past Surgical History:  Procedure Laterality Date  . BREAST LUMPECTOMY  09/01/2011  . BREAST LUMPECTOMY  1996   left breast  . CATARACT EXTRACTION, BILATERAL    . TONSILLECTOMY    . TUBAL LIGATION  1980  . US ECHOCARDIOGRAPHY  12/17/2009   EF 55-60%  . varicose vein removal  1970     reports that she quit smoking about 64 years ago. Her smoking use included cigarettes. she has never used smokeless tobacco. She reports that she drinks alcohol. She reports that she does not use drugs.  Allergies  Allergen Reactions  . Codeine Shortness Of Breath  . Lexapro [Escitalopram Oxalate] Other (See Comments)    "Wiped patient out"  . Lodine [Etodolac]   . Namenda [Memantine Hcl] Other (See Comments)    "Wiped patient out"    Family History  Problem Relation Age of Onset  . Heart disease  Mother   . Heart disease Father   . Heart disease Sister   . Heart disease Brother   . Arthritis Brother   . Heart disease Sister   . Alzheimer's disease Sister   . Cancer Neg Hx     Acceptable: Family history reviewed and not pertinent (If you reviewed it)  Prior to Admission medications   Medication Sig Start Date End Date Taking? Authorizing Provider  aspirin 325 MG tablet Take 1 tablet (325 mg total) by mouth daily. Patient not taking: Reported on 03/29/2017 02/01/14   Kelvin Cellar, MD  cephALEXin (KEFLEX) 500 MG capsule Take 1 capsule (500 mg total) by mouth 3 (three) times daily. 03/29/17   Pattricia Boss,  MD  mirtazapine (REMERON) 7.5 MG tablet Take 1 tablet (7.5 mg total) by mouth at bedtime. 03/09/17   Gayland Curry, DO    Physical Exam: Vitals:   03/30/17 1728 03/30/17 1733 03/30/17 2000 03/30/17 2031  BP:  (!) 188/77 (!) 204/73 (!) 153/112  Pulse:  81 67 (!) 107  Resp:  16 18 20   Temp:  97.7 F (36.5 C)    TempSrc:  Oral    SpO2:  100% 98% 97%  Weight: 51.3 kg (113 lb)         Constitutional: NAD, calm, comfortable Vitals:   03/30/17 1728 03/30/17 1733 03/30/17 2000 03/30/17 2031  BP:  (!) 188/77 (!) 204/73 (!) 153/112  Pulse:  81 67 (!) 107  Resp:  16 18 20   Temp:  97.7 F (36.5 C)    TempSrc:  Oral    SpO2:  100% 98% 97%  Weight: 51.3 kg (113 lb)      Eyes: PERRL, lids and conjunctivae normal ENMT: Mucous membranes are DRY. Posterior pharynx clear of any exudate or lesions.Normal dentition.  Neck: normal, supple, no masses, no thyromegaly Respiratory: clear to auscultation bilaterally, no wheezing, no crackles. Normal respiratory effort. No accessory muscle use.  Cardiovascular: Regular rate and rhythm, no murmurs / rubs / gallops. No extremity edema. 2+ pedal pulses. No carotid bruits.  Abdomen: no tenderness, no masses palpated. No hepatosplenomegaly. Bowel sounds positive.  Musculoskeletal: no clubbing / cyanosis. No joint deformity upper and lower extremities. Good ROM, no contractures. Normal muscle tone.  Skin: no rashes, lesions, ulcers. No induration Neurologic: CN 2-12 grossly intact. Sensation intact, DTR normal. Strength 5/5 in all 4.  Psychiatric: Normal judgment and insight. Alert and oriented x1. Normal mood.     Labs on Admission: I have personally reviewed following labs and imaging studies  CBC: Recent Labs  Lab 03/29/17 1223 03/29/17 1238 03/30/17 1748  WBC 9.2  --  9.5  NEUTROABS 7.0  --  7.1  HGB 14.1 15.0 14.0  HCT 43.9 44.0 42.8  MCV 95.4  --  93.9  PLT 248  --  767   Basic Metabolic Panel: Recent Labs  Lab 03/29/17 1223  03/29/17 1238 03/30/17 1748  NA 138 140 141  K 4.4 4.5 4.0  CL 103 103 105  CO2 28  --  26  GLUCOSE 100* 100* 105*  BUN 22* 23* 30*  CREATININE 0.92 0.80 1.08*  CALCIUM 9.1  --  9.2   GFR: CrCl cannot be calculated (Unknown ideal weight.). Liver Function Tests: Recent Labs  Lab 03/29/17 1223 03/30/17 1748  AST 20 19  ALT 10* 11*  ALKPHOS 61 61  BILITOT 0.8 0.4  PROT 6.8 6.9  ALBUMIN 3.5 3.5   No results for input(s): LIPASE, AMYLASE in  the last 168 hours. No results for input(s): AMMONIA in the last 168 hours. Coagulation Profile: Recent Labs  Lab 03/29/17 1223  INR 0.98   Cardiac Enzymes: No results for input(s): CKTOTAL, CKMB, CKMBINDEX, TROPONINI in the last 168 hours. BNP (last 3 results) No results for input(s): PROBNP in the last 8760 hours. HbA1C: No results for input(s): HGBA1C in the last 72 hours. CBG: Recent Labs  Lab 03/29/17 1218 03/30/17 1755  GLUCAP 82 94   Lipid Profile: No results for input(s): CHOL, HDL, LDLCALC, TRIG, CHOLHDL, LDLDIRECT in the last 72 hours. Thyroid Function Tests: No results for input(s): TSH, T4TOTAL, FREET4, T3FREE, THYROIDAB in the last 72 hours. Anemia Panel: No results for input(s): VITAMINB12, FOLATE, FERRITIN, TIBC, IRON, RETICCTPCT in the last 72 hours. Urine analysis:    Component Value Date/Time   COLORURINE YELLOW 03/30/2017 1920   APPEARANCEUR CLEAR 03/30/2017 1920   APPEARANCEUR Clear 10/19/2014 1515   LABSPEC 1.015 03/30/2017 1920   PHURINE 5.0 03/30/2017 1920   GLUCOSEU NEGATIVE 03/30/2017 1920   HGBUR SMALL (A) 03/30/2017 1920   BILIRUBINUR NEGATIVE 03/30/2017 1920   BILIRUBINUR neg 10/07/2015 1638   BILIRUBINUR Negative 10/19/2014 1515   KETONESUR NEGATIVE 03/30/2017 1920   PROTEINUR 30 (A) 03/30/2017 1920   UROBILINOGEN negative 10/07/2015 1638   UROBILINOGEN 1.0 05/29/2014 1740   NITRITE NEGATIVE 03/30/2017 1920   LEUKOCYTESUR MODERATE (A) 03/30/2017 1920   LEUKOCYTESUR 1+ (A) 10/19/2014  1515   Sepsis Labs: !!!!!!!!!!!!!!!!!!!!!!!!!!!!!!!!!!!!!!!!!!!! @LABRCNTIP (procalcitonin:4,lacticidven:4) ) Recent Results (from the past 240 hour(s))  Urine culture     Status: None   Collection Time: 03/29/17  4:04 PM  Result Value Ref Range Status   Specimen Description URINE, CLEAN CATCH  Final   Special Requests Normal  Final   Culture NO GROWTH  Final   Report Status 03/30/2017 FINAL  Final     Radiological Exams on Admission: Ct Head Code Stroke Wo Contrast  Result Date: 03/29/2017 CLINICAL DATA:  Code stroke. 83 year old female with slurred speech. Last seen normal 1025 hours. EXAM: CT HEAD WITHOUT CONTRAST TECHNIQUE: Contiguous axial images were obtained from the base of the skull through the vertex without intravenous contrast. COMPARISON:  Brain MRI 01/31/2014, intracranial MRA, and head CT 01/30/2014. FINDINGS: Brain: Decreased cerebral volume since 2015 appears generalized. No midline shift, mass effect, or evidence of intracranial mass lesion. No acute intracranial hemorrhage identified. No ventriculomegaly. Patchy bilateral cerebral white matter hypodensity is more apparent by CT but appears similar to the MRI appearance in 2015. Heterogeneity in the left thalamus is new since 2015. Other deep gray matter nuclei appear stable. Stable brainstem and cerebellum. No cortical encephalomalacia or acute cortically based infarct identified. Vascular: Extensive Calcified atherosclerosis at the skull base. No suspicious intracranial vascular hyperdensity. Skull: No acute osseous abnormality identified. Sinuses/Orbits: Visualized paranasal sinuses and mastoids are stable and well pneumatized. Other: No acute orbit or scalp soft tissue findings. ASPECTS [facility] Stroke Program Early CT Score) - Ganglionic level infarction (caudate, lentiform nuclei, internal capsule, insula, M1-M3 cortex): 7 - Supraganglionic infarction (M4-M6 cortex): 3 Total score (0-10 with 10 being normal): 10 IMPRESSION:  1. No acute cortically based infarct or acute intracranial hemorrhage identified. 2. ASPECTS is 10. 3. Progression of chronic bilateral white matter disease, and possible new involvement of the left thalamus, since 2015 comparisons. 4. These results were communicated to Dr. Leonel Ramsay at 1241 hours on 03/29/2017 by text page via the [facility] messaging system. Electronically Signed   By: Herminio Heads.D.  On: 03/29/2017 12:44    EKG: Independently reviewed.   Assessment/Plan Active Problems:   UTI (urinary tract infection)    Acute metabolic encephalopathy (AAOx1), worse from baseline of AAOx2 Urinary tract infection - Admit to inpatient for IV antibiotics and treatment - UA shows urinary tract infection, urine culture sent but patient is already on antibiotics. -We will continue IV Rocephin -Neurochecks, CT of the head was negative from yesterday.  If this fails to improve with UTI she may need MRI of the brain -Provide supportive care, gentle IV fluids  Mild renal insufficiency; Cr 1.08 (baseline 0.8) Mild dehydration - Secondary to urinary tract infection and mild dehydration -Gentle IV fluids, avoid nephrotoxic drugs.  Monitor creatinine  Poor oral intake -Secondary to metabolic encephalopathy.  Hopefully this will improve with her infection.  History of depression - On mirtazapine at home which I will continue  History of vascular dementia per records - Continue supportive care  DVT prophylaxis: Hep SQ Code Status: Presumed Full Code  Family Communication: None at bedside  Disposition Plan: TBD Consults called:None Admission status: Inpatient admit    [place] Kinyon Arsenio Loader MD [facility]   If 7PM-7AM, please contact night-coverage [email or web address] Password [facility]  03/30/2017, 9:19 PM

## 2017-03-30 NOTE — ED Notes (Signed)
During rounding pt found to have taken off her yellow socks and her gown and EKG leads. Pt was walking naked in the room.  RN put in for safety sitter

## 2017-03-30 NOTE — Telephone Encounter (Signed)
Pharmacy called to verify dosage and instructions Rx written yesterday.

## 2017-03-30 NOTE — ED Notes (Signed)
Pt. 's son, Dr. Elvin So called for urine culture results.   Results reviewed with Dr. Baldo Daub. 03/30/2017, 14:03

## 2017-03-30 NOTE — ED Notes (Signed)
Bed assigned @ 2100 1527

## 2017-03-31 ENCOUNTER — Inpatient Hospital Stay (HOSPITAL_COMMUNITY): Payer: Medicare Other

## 2017-03-31 ENCOUNTER — Other Ambulatory Visit: Payer: Self-pay

## 2017-03-31 ENCOUNTER — Telehealth: Payer: Self-pay

## 2017-03-31 ENCOUNTER — Ambulatory Visit: Payer: Self-pay | Admitting: Nurse Practitioner

## 2017-03-31 DIAGNOSIS — F0150 Vascular dementia without behavioral disturbance: Secondary | ICD-10-CM

## 2017-03-31 DIAGNOSIS — N179 Acute kidney failure, unspecified: Secondary | ICD-10-CM

## 2017-03-31 DIAGNOSIS — G9341 Metabolic encephalopathy: Secondary | ICD-10-CM

## 2017-03-31 DIAGNOSIS — N3000 Acute cystitis without hematuria: Secondary | ICD-10-CM

## 2017-03-31 DIAGNOSIS — R627 Adult failure to thrive: Secondary | ICD-10-CM

## 2017-03-31 LAB — GLUCOSE, CAPILLARY
Glucose-Capillary: 78 mg/dL (ref 65–99)
Glucose-Capillary: 87 mg/dL (ref 65–99)
Glucose-Capillary: 127 mg/dL — ABNORMAL HIGH (ref 65–99)
Glucose-Capillary: 97 mg/dL (ref 65–99)

## 2017-03-31 LAB — COMPREHENSIVE METABOLIC PANEL
ALT: 9 U/L — ABNORMAL LOW (ref 14–54)
AST: 21 U/L (ref 15–41)
Albumin: 3.4 g/dL — ABNORMAL LOW (ref 3.5–5.0)
Alkaline Phosphatase: 54 U/L (ref 38–126)
Anion gap: 8 (ref 5–15)
BILIRUBIN TOTAL: 0.8 mg/dL (ref 0.3–1.2)
BUN: 19 mg/dL (ref 6–20)
CHLORIDE: 105 mmol/L (ref 101–111)
CO2: 28 mmol/L (ref 22–32)
Calcium: 9 mg/dL (ref 8.9–10.3)
Creatinine, Ser: 0.67 mg/dL (ref 0.44–1.00)
Glucose, Bld: 86 mg/dL (ref 65–99)
POTASSIUM: 3.5 mmol/L (ref 3.5–5.1)
Sodium: 141 mmol/L (ref 135–145)
TOTAL PROTEIN: 6.9 g/dL (ref 6.5–8.1)

## 2017-03-31 LAB — CBC
HEMATOCRIT: 42.9 % (ref 36.0–46.0)
Hemoglobin: 13.9 g/dL (ref 12.0–15.0)
MCH: 30.3 pg (ref 26.0–34.0)
MCHC: 32.4 g/dL (ref 30.0–36.0)
MCV: 93.7 fL (ref 78.0–100.0)
PLATELETS: 254 10*3/uL (ref 150–400)
RBC: 4.58 MIL/uL (ref 3.87–5.11)
RDW: 13.6 % (ref 11.5–15.5)
WBC: 7.9 10*3/uL (ref 4.0–10.5)

## 2017-03-31 MED ORDER — HYDRALAZINE HCL 20 MG/ML IJ SOLN
5.0000 mg | Freq: Once | INTRAMUSCULAR | Status: AC
  Administered 2017-03-31: 5 mg via INTRAVENOUS
  Filled 2017-03-31: qty 1

## 2017-03-31 MED ORDER — SENNOSIDES-DOCUSATE SODIUM 8.6-50 MG PO TABS
1.0000 | ORAL_TABLET | Freq: Two times a day (BID) | ORAL | Status: DC
  Administered 2017-03-31 – 2017-04-02 (×5): 1 via ORAL
  Filled 2017-03-31 (×5): qty 1

## 2017-03-31 MED ORDER — ENSURE ENLIVE PO LIQD
237.0000 mL | Freq: Two times a day (BID) | ORAL | Status: DC
  Administered 2017-03-31 – 2017-04-02 (×5): 237 mL via ORAL

## 2017-03-31 MED ORDER — LIP MEDEX EX OINT
TOPICAL_OINTMENT | CUTANEOUS | Status: AC
  Filled 2017-03-31: qty 7

## 2017-03-31 MED ORDER — TAMSULOSIN HCL 0.4 MG PO CAPS
0.4000 mg | ORAL_CAPSULE | Freq: Every day | ORAL | Status: DC
  Administered 2017-03-31 – 2017-04-02 (×3): 0.4 mg via ORAL
  Filled 2017-03-31 (×3): qty 1

## 2017-03-31 NOTE — Telephone Encounter (Signed)
Patient rescheduled for 04/08/2017 with Sherrie Mustache

## 2017-03-31 NOTE — Telephone Encounter (Signed)
I called patient's son to confirm that patient is currently admitted to [facility]. Based on Epic notes, pt was admitted late yesterday.

## 2017-03-31 NOTE — Telephone Encounter (Signed)
Spoke with Dr. Baldo Daub.  I had been reading her ED visit information.  "the other day, she fell in the dining hall at [employer].  No injury.  He had said to monitor her.  She had a rough night after that.  The following day, she was weak, ataxic, and they called him back and he recommended sending her to the ED--she went to [facility].  She was found to be hypertensive and to have a "UTI" for which she was given abx there and then placed on keflex orally.  He had called our office for a f/u and pt was to see Janett Billow today.  Unfortunately, staff at [facility] called him back last night and she was worse, weaker, more lethargic and this time she was brought to [facility].  She was admitted for IV abx for UTI.    Plan prior to illness had been for her to come in for an FL-2 at our office to transfer from [facility] to [facility].  She's gotten more demented, frail and HOH than she had been.  Her intake has been poor, she's generally happy but has periods of tearfulness.  She was no longer remembering the relationships among family members during Thanksgiving.    We discussed that at the hospital, she'll get a PT/OT eval and may need rehab prior to placement at [facility] or back to [facility].  I recommended speaking with the hospitalist about this need.  Dr. Baldo Daub was in agreement.  I advised for him to call us back if she returns to [place] and needs to have an FL-2 still done to transfer to [facility].  I also requested that she be seen upon discharge from the hospital so I can see how she's doing since I have not seen her since summer of 2017.

## 2017-03-31 NOTE — Progress Notes (Signed)
Patient is from [facility]. CSW will follow if SNF needs are recommended.   CSW left message for patient son.   Kathrin Greathouse, Latanya Presser, MSW Clinical Social Worker  [phone] 03/31/2017  2:41 PM

## 2017-03-31 NOTE — Progress Notes (Addendum)
PROGRESS NOTE  Rachael Perez GQQ:[id number] DOB: [birth year] DOA: 03/30/2017 PCP: Gayland Curry, DO  HPI/Recap of past 24 hours:  Patient has dementia and very hard of hearing, not able to provide history  She was agitated last night due to does not like external catheter,  She is calm this am Tele sitter in room  Assessment/Plan: Active Problems:   UTI (urinary tract infection)  Metabolic encephalopathy with baseline progressive dementia -She was sent to ED on 12/10 due to confusion and fall at independent living, ct head was negative, she was evaluated by neurology in the ED , she is discharged back to independent living with oral abx for possible uti -she is sent back to ED on 12/12 due to continue confusion and lethargy -ua on admission wbc TNTC, she is started on rocephin and ivf on admission - will get bladder scan, ? Tender over suprapubic area? physical exam is not reliable , she says yes to most of the questions, she is only oriented to self -repeat ct head , continue asa  Addendum: PVR 286cc, place foley. Start flomax. Start bowel regimen.   AKI:  Cr 1.08 on admission, cr decreased to 0.67 with ivf and abx Repeat bmp in am, monitor renal function   HTN: does not appear to be on any bp meds at home Monitor,   H/o vascular Dementia: report progressive pmd recently started on remeron a week ago due to progressive confusion Will check tsh/b12/folate/rpr  H/o breast cancer s/p bilateral lumpectomy   FTT: with progressive confusion, falls, pmd is in the process of transferring patient to ALF Get pt/ot, social worker Optometrist consult  Code Status: full  Family Communication: patient , son Dr Baldo Daub over the phone  Disposition Plan: pending   Consultants:  PT/OT/social worker  Procedures:  none  Antibiotics:  Rocephin since admission   Objective: BP (!) 166/85 (BP Location: Left Arm)   Pulse 78   Temp 97.7 F (36.5 C) (Oral)    Resp 16   Ht 5\' 8"  (1.727 m)   Wt 49.3 kg (108 lb 11.2 oz)   SpO2 97%   BMI 16.53 kg/m   Intake/Output Summary (Last 24 hours) at 03/31/2017 1030 Last data filed at 03/31/2017 0842 Gross per 24 hour  Intake 1943.75 ml  Output -  Net 1943.75 ml   Filed Weights   03/30/17 1728 03/30/17 2159  Weight: 51.3 kg (113 lb) 49.3 kg (108 lb 11.2 oz)    Exam: Patient is examined daily including today on 03/31/2017, exams remain the same as of yesterday except that has changed    General:  Frail, thin, NAD, only oriented to self, very hard of hearing  Cardiovascular: RRR  Respiratory: CTABL  Abdomen: Soft, positive BS, tender over suprapubic area?  Musculoskeletal: No Edema  Neuro: alert, only oriented to self, calm  Data Reviewed: Basic Metabolic Panel: Recent Labs  Lab 03/29/17 1223 03/29/17 1238 03/30/17 1748 03/31/17 0523  NA 138 140 141 141  K 4.4 4.5 4.0 3.5  CL 103 103 105 105  CO2 28  --  26 28  GLUCOSE 100* 100* 105* 86  BUN 22* 23* 30* 19  CREATININE 0.92 0.80 1.08* 0.67  CALCIUM 9.1  --  9.2 9.0   Liver Function Tests: Recent Labs  Lab 03/29/17 1223 03/30/17 1748 03/31/17 0523  AST 20 19 21   ALT 10* 11* 9*  ALKPHOS 61 61 54  BILITOT 0.8 0.4 0.8  PROT 6.8 6.9 6.9  ALBUMIN 3.5 3.5 3.4*   No results for input(s): LIPASE, AMYLASE in the last 168 hours. No results for input(s): AMMONIA in the last 168 hours. CBC: Recent Labs  Lab 03/29/17 1223 03/29/17 1238 03/30/17 1748 03/31/17 0523  WBC 9.2  --  9.5 7.9  NEUTROABS 7.0  --  7.1  --   HGB 14.1 15.0 14.0 13.9  HCT 43.9 44.0 42.8 42.9  MCV 95.4  --  93.9 93.7  PLT 248  --  253 254   Cardiac Enzymes:   No results for input(s): CKTOTAL, CKMB, CKMBINDEX, TROPONINI in the last 168 hours. BNP (last 3 results) No results for input(s): BNP in the last 8760 hours.  ProBNP (last 3 results) No results for input(s): PROBNP in the last 8760 hours.  CBG: Recent Labs  Lab 03/29/17 1218  03/30/17 1755 03/30/17 2210 03/31/17 0756  GLUCAP 82 94 76 78    Recent Results (from the past 240 hour(s))  Urine culture     Status: None   Collection Time: 03/29/17  4:04 PM  Result Value Ref Range Status   Specimen Description URINE, CLEAN CATCH  Final   Special Requests Normal  Final   Culture NO GROWTH  Final   Report Status 03/30/2017 FINAL  Final     Studies: No results found.  Scheduled Meds: . aspirin  325 mg Oral Daily  . heparin  5,000 Units Subcutaneous Q8H  . insulin aspart  0-9 Units Subcutaneous TID WC  . mirtazapine  7.5 mg Oral QHS    Continuous Infusions: . sodium chloride 125 mL/hr at 03/30/17 1758  . sodium chloride 75 mL/hr at 03/30/17 2205  . cefTRIAXone (ROCEPHIN)  IV       Time spent: 51mins I have personally reviewed and interpreted on  03/31/2017 daily labs,  imagings as discussed above under date review session and assessment and plans.  I reviewed all nursing notes, pharmacy notes,  vitals, pertinent old records  I have discussed plan of care as described above with RN , patient and family on 03/31/2017   Florencia Reasons MD, PhD  [facility] Pager [phone]. If 7PM-7AM, please contact night-coverage at [email or web address], password [facility] 03/31/2017, 10:30 AM  LOS: 1 day

## 2017-03-31 NOTE — Progress Notes (Signed)
Initial Nutrition Assessment  DOCUMENTATION CODES:   Underweight  INTERVENTION:   Provide Ensure Enlive po BID, each supplement provides 350 kcal and 20 grams of protein RD will continue to monitor  NUTRITION DIAGNOSIS:   Inadequate oral intake related to lethargy/confusion as evidenced by per patient/family report.  GOAL:   Patient will meet greater than or equal to 90% of their needs  MONITOR:   PO intake, Supplement acceptance, Weight trends, Labs, I & O's  REASON FOR ASSESSMENT:   Malnutrition Screening Tool    ASSESSMENT:   83 y.o. female with medical history significant of vascular dementia, depression, general anxiety disorder came to the hospital for evaluation of change in mental status.  Patient has advanced dementia and currently more altered than baseline due to underlying infection therefore history is limited.  No family members at bedside.  Patient unable to provide history given advanced dementia and HOH status. Pt began to have increased AMS on 12/10 per chart review, with decreased PO intake. Pt consumed 25% of breakfast this morning. RD will place order for Ensure supplements given poor PO intake.   Per weight records, pt has lost 5 lb since 12/10 (4% wt loss x 2 days which may be related to fluid loss). Will continue to monitor weight trends.  Labs reviewed. Medications: Remeron tablet daily, Senokot-S tablet BID  NUTRITION - FOCUSED PHYSICAL EXAM:  Unable to perform NFPE given AMS.  Diet Order:  Diet regular Room service appropriate? Yes; Fluid consistency: Thin  EDUCATION NEEDS:   Not appropriate for education at this time  Skin:  Skin Assessment: Reviewed RN Assessment  Last BM:  PTA  Height:   Ht Readings from Last 1 Encounters:  03/31/17 5\' 8"  (1.727 m)    Weight:   Wt Readings from Last 1 Encounters:  03/30/17 108 lb 11.2 oz (49.3 kg)    Ideal Body Weight:  63.6 kg  BMI:  Body mass index is 16.53 kg/m.  Estimated  Nutritional Needs:   Kcal:  1200-1400  Protein:  50-60g  Fluid:  1.4L/day  Clayton Bibles, MS, RD, LDN [facility] Pager: [phone] After Hours Pager: [phone]

## 2017-03-31 NOTE — Telephone Encounter (Signed)
I spoke with patient's son and he verified that patient is currently admitted to hospital. Appointment for today was cancelled and a new appt was scheduled for 12/20.

## 2017-03-31 NOTE — Patient Outreach (Signed)
[facility]) Care Management  03/31/2017  Rachael Perez May 24, 1926 [id number]   RN Health Coach notified that the patient was admitted to the hospital for Metabolic Encephalopathy.  RN Health Coach is closing the case.  Plan:  RN Health Coach will notify the hospital Liaison of admission.  Lazaro Arms RN, BSN, [place] Direct Dial:  [phone] Fax: [phone]

## 2017-04-01 ENCOUNTER — Inpatient Hospital Stay (HOSPITAL_COMMUNITY): Payer: Medicare Other

## 2017-04-01 ENCOUNTER — Ambulatory Visit: Payer: Self-pay

## 2017-04-01 LAB — MAGNESIUM: Magnesium: 1.6 mg/dL — ABNORMAL LOW (ref 1.7–2.4)

## 2017-04-01 LAB — URINE CULTURE: Culture: NO GROWTH

## 2017-04-01 LAB — GLUCOSE, CAPILLARY
GLUCOSE-CAPILLARY: 108 mg/dL — AB (ref 65–99)
GLUCOSE-CAPILLARY: 126 mg/dL — AB (ref 65–99)
Glucose-Capillary: 111 mg/dL — ABNORMAL HIGH (ref 65–99)
Glucose-Capillary: 90 mg/dL (ref 65–99)
Glucose-Capillary: 255 mg/dL — ABNORMAL HIGH (ref 65–99)

## 2017-04-01 LAB — BASIC METABOLIC PANEL
ANION GAP: 9 (ref 5–15)
BUN: 21 mg/dL — ABNORMAL HIGH (ref 6–20)
CALCIUM: 9 mg/dL (ref 8.9–10.3)
CO2: 26 mmol/L (ref 22–32)
CREATININE: 0.66 mg/dL (ref 0.44–1.00)
Chloride: 104 mmol/L (ref 101–111)
GLUCOSE: 103 mg/dL — AB (ref 65–99)
Potassium: 3.6 mmol/L (ref 3.5–5.1)
Sodium: 139 mmol/L (ref 135–145)

## 2017-04-01 LAB — HEMOGLOBIN A1C
HEMOGLOBIN A1C: 5.6 % (ref 4.8–5.6)
MEAN PLASMA GLUCOSE: 114.02 mg/dL

## 2017-04-01 LAB — FOLATE: Folate: 11.3 ng/mL (ref >5.9)

## 2017-04-01 LAB — VITAMIN B12: VITAMIN B 12: 141 pg/mL — AB (ref 180–914)

## 2017-04-01 LAB — TSH: TSH: 2.162 u[IU]/mL (ref 0.350–4.500)

## 2017-04-01 LAB — RPR: RPR Ser Ql: NONREACTIVE

## 2017-04-01 MED ORDER — POLYETHYLENE GLYCOL 3350 17 G PO PACK
17.0000 g | PACK | Freq: Every day | ORAL | Status: DC
  Administered 2017-04-01 – 2017-04-02 (×2): 17 g via ORAL
  Filled 2017-04-01 (×2): qty 1

## 2017-04-01 MED ORDER — BISACODYL 10 MG RE SUPP
10.0000 mg | Freq: Every day | RECTAL | Status: DC
  Administered 2017-04-01: 10 mg via RECTAL
  Filled 2017-04-01 (×2): qty 1

## 2017-04-01 MED ORDER — MAGNESIUM SULFATE 2 GM/50ML IV SOLN
2.0000 g | Freq: Once | INTRAVENOUS | Status: AC
  Administered 2017-04-01: 2 g via INTRAVENOUS
  Filled 2017-04-01: qty 50

## 2017-04-01 MED ORDER — POTASSIUM CHLORIDE 20 MEQ/15ML (10%) PO SOLN
40.0000 meq | Freq: Once | ORAL | Status: AC
  Administered 2017-04-01: 40 meq via ORAL
  Filled 2017-04-01: qty 30

## 2017-04-01 MED ORDER — INFLUENZA VAC SPLIT HIGH-DOSE 0.5 ML IM SUSY
0.5000 mL | PREFILLED_SYRINGE | INTRAMUSCULAR | Status: AC
  Administered 2017-04-02: 0.5 mL via INTRAMUSCULAR
  Filled 2017-04-01: qty 0.5

## 2017-04-01 MED ORDER — VITAMIN B-12 1000 MCG PO TABS
1000.0000 ug | ORAL_TABLET | Freq: Every day | ORAL | Status: DC
  Administered 2017-04-01 – 2017-04-02 (×2): 1000 ug via ORAL
  Filled 2017-04-01 (×2): qty 1

## 2017-04-01 NOTE — Consult Note (Signed)
   [facility] Inpatient Consult   04/01/2017  MAYAH URQUIDI 1926/05/16 [id number]    Made aware of hospitalization by [facility] RN Healthcoach, Requested that hospital liaison follow up for [facility] services.   Went to bedside. Family not present. Nursing advises writer to contact patient's son Shyler Hamill at [phone] to discuss [facility]. Mrs. Margolis has some confusion. Spoke with inpatient LCSW who states she has spoken to both sons and it is okay for Probation officer to contact son.   Maxine Glenn son/HCPOA at [phone] to discuss [facility] Management services. He is agreeable and verbal consent obtained. Not sure which facility patient will be going to as of yet. Made son aware that writer will leave [place] Management packet with his mother's belongings to have for their records.   Will make referral to [facility] LCSW once SNF disposition facility is known. Patient was active with [facility] prior to hospitalization.   Satoya Feeley is primary contact at [phone].   Marthenia Rolling, MSN-Ed, RN,BSN [facility] Liaison [phone]

## 2017-04-01 NOTE — Progress Notes (Addendum)
PROGRESS NOTE  Rachael Perez DZH:[id number] DOB: 01-09-1927 DOA: 03/30/2017 PCP: Gayland Curry, DO  HPI/Recap of past 24 hours:  Patient has dementia and very hard of hearing, not able to provide history  Patient was drowsy after worked with PT and OT this am,  I went back to check on her in the afternoon, she is more alert and interactive.  She wants the foley catheter out, but no bm yet  Assessment/Plan: Active Problems:   UTI (urinary tract infection)  Metabolic encephalopathy with baseline progressive dementia -She was sent to ED on 12/10 due to confusion and fall at independent living, ct head was negative, she was evaluated by neurology in the ED , she is discharged back to independent living with oral abx for possible uti -she is sent back to ED on 12/12 due to continue confusion and lethargy --repeat ct head no acute findings , rpr negative, b12 low, start b12 supplement, continue asa  Urinary retention: PVR 286cc, place foley. Start flomax. Start bowel regimen (kub with constipation).  ua on admission wbc TNTC, she is started on rocephin and ivf on admission voiding trial after bm  AKI:  Cr 1.08 on admission, cr decreased to 0.67 with ivf and abx Repeat bmp in am, monitor renal function  Hypokalemia/hypomagnesemia; replace k/mag   HTN: does not appear to be on any bp meds at home Monitor, consider low dose coreg is remain elevated.  H/o vascular Dementia: report progressive pmd recently started on remeron a week ago due to progressive confusion  tsh/folate/rpr unremarkable Repeat ct head not acute findings. b12 mildly low, start b12 supplement  H/o breast cancer s/p bilateral lumpectomy   FTT: with progressive confusion, falls, pmd is in the process of transferring patient to ALF Get pt/ot, social worker /case manager consult  Code Status: full  Family Communication: patient , son Dr Baldo Daub over the phone daily. Son request flu shot. Will defer  ppd testing to snf.   Disposition Plan: snf, hopefully on 12/14   Consultants:  PT/OT/social worker  Procedures:  none  Antibiotics:  Rocephin since admission   Objective: BP (!) 169/76 (BP Location: Right Arm)   Pulse 74   Temp 97.7 F (36.5 C) (Oral)   Resp 18   Ht 5\' 8"  (1.727 m)   Wt 49.3 kg (108 lb 11.2 oz)   SpO2 97%   BMI 16.53 kg/m   Intake/Output Summary (Last 24 hours) at 04/01/2017 0814 Last data filed at 04/01/2017 0600 Gross per 24 hour  Intake 5504.59 ml  Output 500 ml  Net 5004.59 ml   Filed Weights   03/30/17 1728 03/30/17 2159  Weight: 51.3 kg (113 lb) 49.3 kg (108 lb 11.2 oz)    Exam: Patient is examined daily including today on 04/01/2017, exams remain the same as of yesterday except that has changed    General:  Frail, thin, NAD, only oriented to self, very hard of hearing  Cardiovascular: RRR  Respiratory: CTABL  Abdomen: Soft, positive BS, nontender  Musculoskeletal: No Edema  Neuro: alert, only oriented to self, calm  Data Reviewed: Basic Metabolic Panel: Recent Labs  Lab 03/29/17 1223 03/29/17 1238 03/30/17 1748 03/31/17 0523 04/01/17 0501  NA 138 140 141 141 139  K 4.4 4.5 4.0 3.5 3.6  CL 103 103 105 105 104  CO2 28  --  26 28 26   GLUCOSE 100* 100* 105* 86 103*  BUN 22* 23* 30* 19 21*  CREATININE 0.92 0.80 1.08* 0.67  0.66  CALCIUM 9.1  --  9.2 9.0 9.0  MG  --   --   --   --  1.6*   Liver Function Tests: Recent Labs  Lab 03/29/17 1223 03/30/17 1748 03/31/17 0523  AST 20 19 21   ALT 10* 11* 9*  ALKPHOS 61 61 54  BILITOT 0.8 0.4 0.8  PROT 6.8 6.9 6.9  ALBUMIN 3.5 3.5 3.4*   No results for input(s): LIPASE, AMYLASE in the last 168 hours. No results for input(s): AMMONIA in the last 168 hours. CBC: Recent Labs  Lab 03/29/17 1223 03/29/17 1238 03/30/17 1748 03/31/17 0523  WBC 9.2  --  9.5 7.9  NEUTROABS 7.0  --  7.1  --   HGB 14.1 15.0 14.0 13.9  HCT 43.9 44.0 42.8 42.9  MCV 95.4  --  93.9 93.7    PLT 248  --  253 254   Cardiac Enzymes:   No results for input(s): CKTOTAL, CKMB, CKMBINDEX, TROPONINI in the last 168 hours. BNP (last 3 results) No results for input(s): BNP in the last 8760 hours.  ProBNP (last 3 results) No results for input(s): PROBNP in the last 8760 hours.  CBG: Recent Labs  Lab 03/30/17 2210 03/31/17 0756 03/31/17 1145 03/31/17 1717 03/31/17 2314  GLUCAP 76 78 87 127* 97    Recent Results (from the past 240 hour(s))  Urine culture     Status: None   Collection Time: 03/29/17  4:04 PM  Result Value Ref Range Status   Specimen Description URINE, CLEAN CATCH  Final   Special Requests Normal  Final   Culture NO GROWTH  Final   Report Status 03/30/2017 FINAL  Final  Urine culture     Status: None   Collection Time: 03/30/17  7:20 PM  Result Value Ref Range Status   Specimen Description URINE, RANDOM  Final   Special Requests NONE  Final   Culture   Final    NO GROWTH Performed at [facility], [place] [address] [place],  [address]    Report Status 04/01/2017 FINAL  Final     Studies: Ct Head Wo Contrast  Result Date: 03/31/2017 CLINICAL DATA:  83 y/o female with confusion, unexplained altered mental status. Unsteadiness with recent fall. EXAM: CT HEAD WITHOUT CONTRAST TECHNIQUE: Contiguous axial images were obtained from the base of the skull through the vertex without intravenous contrast. COMPARISON:  Head CT without contrast 03/29/2017. Brain MRI 01/31/2014 FINDINGS: Brain: Stable cerebral volume since 2015. Patchy chronic white matter changes and mild heterogeneity in the left thalamus Re demonstrated and stable. No midline shift, ventriculomegaly, mass effect, evidence of mass lesion, intracranial hemorrhage or evidence of cortically based acute infarction. No cortical encephalomalacia identified. No cortical encephalomalacia identified. Vascular: Calcified atherosclerosis at the skull base. Skull: No acute osseous abnormality  identified. Sinuses/Orbits: Visualized paranasal sinuses and mastoids are stable and well pneumatized. Other: No acute orbit or scalp soft tissue findings. IMPRESSION: Stable non contrast CT appearance of the brain since 03/29/2017. No acute intracranial abnormality. Electronically Signed   By: Genevie Ann M.D.   On: 03/31/2017 14:02    Scheduled Meds: . aspirin  325 mg Oral Daily  . feeding supplement (ENSURE ENLIVE)  237 mL Oral BID BM  . heparin  5,000 Units Subcutaneous Q8H  . insulin aspart  0-9 Units Subcutaneous TID WC  . mirtazapine  7.5 mg Oral QHS  . potassium chloride  40 mEq Oral Once  . senna-docusate  1 tablet  Oral BID  . tamsulosin  0.4 mg Oral Daily    Continuous Infusions: . sodium chloride 125 mL/hr at 03/30/17 1758  . sodium chloride 75 mL/hr at 03/31/17 2354  . cefTRIAXone (ROCEPHIN)  IV Stopped (03/31/17 1853)  . magnesium sulfate 1 - 4 g bolus IVPB       Time spent: 74mins I have personally reviewed and interpreted on  04/01/2017 daily labs,  imagings as discussed above under date review session and assessment and plans.  I reviewed all nursing notes, pharmacy notes,  vitals, pertinent old records  I have discussed plan of care as described above with RN , patient and family on 04/01/2017   Florencia Reasons MD, PhD  [facility] Pager [phone]. If 7PM-7AM, please contact night-coverage at [email or web address], password [facility] 04/01/2017, 8:14 AM  LOS: 2 days

## 2017-04-01 NOTE — Progress Notes (Signed)
CSW attempted to call patient sons to discuss discharge planning to SNF. CSW waiting for return call.   Kathrin Greathouse, Latanya Presser, MSW Clinical Social Worker  [phone] 04/01/2017  1:53 PM

## 2017-04-01 NOTE — Evaluation (Signed)
Occupational Therapy Evaluation Patient Details Name: Rachael Perez MRN: [id number] DOB: 1926-07-09 Today's Date: 04/01/2017    History of Present Illness 83 y.o. female with medical history significant of vascular dementia, depression, HTN, breast cancer, general anxiety disorder came to the hospital from ALF for evaluation of change in mental status.  Dx of UTI.    Clinical Impression   Pt was admitted for the above. She is from ALF.  Pt was walking prior to admission; unsure if she had ADL assistance; pt is unable to answer questions.  Pt currently needs max +2 assistance for mobility and she was shaky, which was affecting ADLs.  Anticipate she will need SNF. Will follow in acute setting and recommend post acute OT    Follow Up Recommendations  SNF    Equipment Recommendations  3 in 1 bedside commode    Recommendations for Other Services       Precautions / Restrictions Precautions Precautions: Fall Precaution Comments: pt confused, unable to provide fall hx; per chart, pt fell prior to admission Restrictions Weight Bearing Restrictions: No      Mobility Bed Mobility Overal bed mobility: Needs Assistance Bed Mobility: Rolling;Sidelying to Sit Rolling: +2 for safety/equipment;+2 for physical assistance;Max assist Sidelying to sit: +2 for physical assistance;+2 for safety/equipment;Max assist       General bed mobility comments: assist to initiate roll and to raise trunk  Transfers Overall transfer level: Needs assistance Equipment used: 2 person hand held assist Transfers: Sit to/from Stand;Stand Pivot Transfers Sit to Stand: Max assist;+2 physical assistance;+2 safety/equipment Stand pivot transfers: Max assist;+2 physical assistance;+2 safety/equipment       General transfer comment: +2 max assist to stand and pivot    Balance Overall balance assessment: Needs assistance   Sitting balance-Leahy Scale: Poor Sitting balance - Comments: min to min/guard  for mild posterior lean initially Postural control: Posterior lean                                 ADL either performed or assessed with clinical judgement   ADL Overall ADL's : Needs assistance/impaired Eating/Feeding: Minimal assistance;Sitting   Grooming: Minimal assistance;Sitting   Upper Body Bathing: Moderate assistance;Sitting   Lower Body Bathing: Maximal assistance;Sit to/from stand;+2 for physical assistance   Upper Body Dressing : Moderate assistance;Sitting   Lower Body Dressing: Maximal assistance;Sit to/from stand;+2 for physical assistance   Toilet Transfer: Maximal assistance;+2 for physical assistance;Stand-pivot(to chair)   Toileting- Clothing Manipulation and Hygiene: Maximal assistance;Sit to/from stand;+2 for physical assistance         General ADL Comments: pt fearful of movement.  Pt shaky this am. NT states that this has come and go.  Difficulty feeding self wtih plastic utensils. Will try to provide weighted spoon, if available     Vision         Perception     Praxis      Pertinent Vitals/Pain Faces Pain Scale: Hurts little more Pain Location: pt grimmaced and held R lateral chest near axilla, but unable to verbalize if she was in pain due to confusion Pain Descriptors / Indicators: Grimacing Pain Intervention(s): Limited activity within patient's tolerance;Monitored during session;Premedicated before session;Repositioned;Ice applied     Hand Dominance     Extremity/Trunk Assessment Upper Extremity Assessment Upper Extremity Assessment: Generalized weakness   Lower Extremity Assessment Lower Extremity Assessment: Generalized weakness   Cervical / Trunk Assessment Cervical / Trunk Assessment: Normal  Communication Communication Communication: No difficulties   Cognition Arousal/Alertness: Awake/alert Behavior During Therapy: WFL for tasks assessed/performed Overall Cognitive Status: No family/caregiver present to  determine baseline cognitive functioning                                 General Comments: per chart, pt has vascular dementia   General Comments       Exercises     Shoulder Instructions      Home Living Family/patient expects to be discharged to:: Skilled nursing facility                                 Additional Comments: admitted from [facility], pt not oriented so unable to provide hx, 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[place]      Prior Functioning/Environment Level of Independence: Independent        Comments: unknown, pt not oriented, no family present        OT Problem List: Decreased strength;Decreased activity tolerance;Impaired balance (sitting and/or standing);Decreased cognition;Decreased safety awareness;Decreased knowledge of use of DME or AE;Pain      OT Treatment/Interventions: Self-care/ADL training;DME and/or AE instruction;Patient/family education;Balance training;Therapeutic activities    OT Goals(Current goals can be found in the care plan section) Acute Rehab OT Goals Patient Stated Goal: none stated OT Goal Formulation: With patient Time For Goal Achievement: 04/08/17 Potential to Achieve Goals: Fair ADL Goals Pt Will Perform Eating: with set-up;with adaptive utensils;sitting Pt Will Transfer to Toilet: with min assist;stand pivot transfer;bedside commode Additional ADL Goal #1: pt will perform UB adls with setup/supervision seated  OT Frequency: Min 2X/week   Barriers to D/C:            Co-evaluation PT/OT/SLP Co-Evaluation/Treatment: Yes Reason for Co-Treatment: Complexity of the patient's impairments (multi-system involvement);For patient/therapist safety PT goals addressed during session: Mobility/safety with mobility OT goals addressed during session: ADL's and self-care      AM-PAC PT "6 Clicks" Daily Activity     Outcome Measure Help from another person eating meals?: A  Little Help from another person taking care of personal grooming?: A Little Help from another person toileting, which includes using toliet, bedpan, or urinal?: A Lot Help from another person bathing (including washing, rinsing, drying)?: A Lot Help from another person to put on and taking off regular upper body clothing?: A Lot Help from another person to put on and taking off regular lower body clothing?: A Lot 6 Click Score: 14   End of Session    Activity Tolerance: Patient tolerated treatment well Patient left: in chair;with call bell/phone within reach;with chair alarm set;with nursing/sitter in room  OT Visit Diagnosis: Unsteadiness on feet (R26.81);Muscle weakness (generalized) (M62.81)                Time: 2409-7353 OT Time Calculation (min): 17 min Charges:  OT General Charges $OT Visit: 1 Visit OT Evaluation $OT Eval Moderate Complexity: 1 Mod G-Codes:     [place], OTR/L [phone] 04/01/2017  Latavia Goga 04/01/2017, 10:16 AM

## 2017-04-01 NOTE — NC FL2 (Signed)
[place] LEVEL OF CARE SCREENING TOOL     IDENTIFICATION  Patient Name: Rachael Perez Birthdate: Sep 28, 1926 Sex: female Admission Date (Current Location): 03/30/2017  [facility] and [place] Number:  Herbalist and Address:  [facility],  [place] [address], [place]      Provider Number: [id number]  Attending Physician Name and Address:  Florencia Reasons, MD  Relative Name and Phone Number:       Current Level of Care: Hospital Recommended Level of Care: [place] Prior Approval Number:    Date Approved/Denied:   PASRR Number: [id number]  Discharge Plan: SNF    Current Diagnoses: Patient Active Problem List   Diagnosis Date Noted  . Generalized anxiety disorder 10/07/2015  . Hypertensive urgency 01/31/2014  . Acute encephalopathy 01/31/2014  . UTI (urinary tract infection) 01/31/2014  . Dementia with behavioral disturbance 01/31/2014  . Acute CVA (cerebrovascular accident) ([place]) 01/30/2014  . Altered mental status 01/30/2014  . Insomnia secondary to anxiety 07/27/2013  . Dry mouth 07/27/2013  . Loss of weight 07/27/2013  . Essential hypertension, benign 11/09/2012  . Senile osteoporosis   . Vitamin D deficiency   . Vascular dementia without behavioral disturbance   . Insomnia, unspecified   . Cancer of upper-outer quadrant of female breast ([place]) 08/24/2011  . History of breast cancer in female 07/31/2011  . Atypical chest pain 06/19/2011  . Elevated blood pressure 12/08/2010  . Abnormal chest CT 12/08/2010  . Chronic anxiety 12/08/2010    Orientation RESPIRATION BLADDER Height & Weight     Self  Normal Incontinent Weight: 108 lb 11.2 oz (49.3 kg) Height:  5\' 8"  (172.7 cm)  BEHAVIORAL SYMPTOMS/MOOD NEUROLOGICAL BOWEL NUTRITION STATUS      Continent Diet(Regular)  AMBULATORY STATUS COMMUNICATION OF NEEDS Skin   Extensive Assist Verbally Normal                       Personal Care Assistance  Level of Assistance  Bathing, Feeding, Dressing Bathing Assistance: Limited assistance Feeding assistance: Independent Dressing Assistance: Limited assistance     Functional Limitations Info  Sight, Hearing, Speech Sight Info: Adequate Hearing Info: Adequate Speech Info: Adequate    SPECIAL CARE FACTORS FREQUENCY  OT (By licensed OT), PT (By licensed PT)     PT Frequency: 5x/week OT Frequency: 5x/week            Contractures Contractures Info: Not present    Additional Factors Info  Code Status, Allergies, Psychotropic Code Status Info: Fullcode Allergies Info: Codeine, Lexapro Escitalopram Oxalate, Lodine Etodolac, Namenda Memantine Hcl           Current Medications (04/01/2017):  This is the current hospital active medication list Current Facility-Administered Medications  Medication Dose Route Frequency Provider Last Rate Last Dose  . 0.9 %  sodium chloride infusion   Intravenous Continuous Isla Pence, MD 125 mL/hr at 03/30/17 1758    . 0.9 %  sodium chloride infusion   Intravenous Continuous Damita Lack, MD 75 mL/hr at 03/31/17 2354    . acetaminophen (TYLENOL) tablet 650 mg  650 mg Oral Q6H PRN Amin, Ankit Chirag, MD       Or  . acetaminophen (TYLENOL) suppository 650 mg  650 mg Rectal Q6H PRN Amin, Ankit Chirag, MD      . aspirin tablet 325 mg  325 mg Oral Daily Amin, Ankit Chirag, MD   325 mg at 04/01/17 0947  . bisacodyl (DULCOLAX)  suppository 10 mg  10 mg Rectal Daily Florencia Reasons, MD      . cefTRIAXone (ROCEPHIN) 1 g in dextrose 5 % 50 mL IVPB  1 g Intravenous Q24H Eudelia Bunch, RPH   Stopped at 03/31/17 1853  . feeding supplement (ENSURE ENLIVE) (ENSURE ENLIVE) liquid 237 mL  237 mL Oral BID BM Florencia Reasons, MD   237 mL at 04/01/17 1514  . heparin injection 5,000 Units  5,000 Units Subcutaneous Q8H Amin, Ankit Chirag, MD   5,000 Units at 04/01/17 1513  . insulin aspart (novoLOG) injection 0-9 Units  0-9 Units Subcutaneous TID WC Amin, Jeanella Flattery, MD    1 Units at 03/31/17 1758  . mirtazapine (REMERON) tablet 7.5 mg  7.5 mg Oral QHS Amin, Ankit Chirag, MD   7.5 mg at 03/31/17 2158  . senna-docusate (Senokot-S) tablet 1 tablet  1 tablet Oral QHS PRN Amin, Ankit Chirag, MD      . senna-docusate (Senokot-S) tablet 1 tablet  1 tablet Oral BID Florencia Reasons, MD   1 tablet at 04/01/17 [phone]  . tamsulosin (FLOMAX) capsule 0.4 mg  0.4 mg Oral Daily Florencia Reasons, MD   0.4 mg at 04/01/17 2500     Discharge Medications: Please see discharge summary for a list of discharge medications.  Relevant Imaging Results:  Relevant Lab Results:   Additional Information ssn: [phone]  Lia Hopping, LCSW

## 2017-04-01 NOTE — Clinical Social Work Note (Signed)
Clinical Social Work Assessment  Patient Details  Name: Rachael Perez MRN: [id number] Date of Birth: 1927-02-22  Date of referral:  04/01/17               Reason for consult:  Facility Placement                Permission sought to share information with:  Family Supports, Customer service manager Permission granted to share information::     Name::        Agency::     Relationship::  Sons   Contact Information:   Rachael Perez-[phone]                                                    Rachael Perez- [phone]  Housing/Transportation Living arrangements for the past 2 months:  Tro[place] of Information:  Adult Children Patient Interpreter Needed:  None Criminal Activity/Legal Involvement Pertinent to Current Situation/Hospitalization:  No - Comment as needed Significant Relationships:  Adult Children Lives with:  Facility Resident Do you feel safe going back to the place where you live?    Need for family participation in patient care:  No (Dependent w/mobility)  Care giving concerns:  Patient has advanced dementia. She was admitted for altered mental stays  Due to underlying infection.  Patient was started on IV antibiotics for further treatment.   Social Worker assessment / plan:  CSW spoke with patient sons Rachael Brow and Rachael Perez. CSW discussed discharge planning with patient sons, both are agreeable for patient to discharge to SNF for short rehab.   While at the facility the patient uses a can to walk and can usually ambulate on her own. She can feed herself and needs limited assistance with her ADL's.   Patient sons state they have been trying to move the patient from [place] to an [place]. Patient sons inquired about process. CSW explain SNF and ALF process.   FL2 complete and PASRR complete.   Plan: Assist with discharge to SNF.  Employment status:  Retired Forensic scientist:  Medicare PT  Recommendations:  [place] / Referral to community resources:  [place]  Patient/Family's Response to care:  Patient sons very involved in patient care and following for updates.   Patient/Family's Understanding of and Emotional Response to Diagnosis, Current Treatment, and Prognosis:  Patient sons are both knowledgeable of patient diagnosis and current treatment. Patient son is a Psychologist, sport and exercise in [place].   Emotional Assessment Appearance:  Developmentally appropriate Attitude/Demeanor/Rapport:  Unable to Assess Affect (typically observed):  Unable to Assess Orientation:  Oriented to Self Alcohol / Substance use:  Not Applicable Psych involvement (Current and /or in the community):  No (Comment)  Discharge Needs  Concerns to be addressed:  Discharge Planning Concerns Readmission within the last 30 days:    Current discharge risk:  Cognitively Impaired, Dependent with Mobility Barriers to Discharge:  Continued Medical Work up   [employer], LCSW 04/01/2017, 4:15 PM

## 2017-04-01 NOTE — Evaluation (Signed)
Physical Therapy Evaluation Patient Details Name: Rachael Perez MRN: [id number] DOB: 1926/10/30 Today's Date: 04/01/2017   History of Present Illness  83 y.o. female with medical history significant of vascular dementia, depression, HTN, breast cancer, general anxiety disorder came to the hospital from ALF for evaluation of change in mental status.  Dx of UTI.   Clinical Impression  Pt admitted with above diagnosis. Pt currently with functional limitations due to the deficits listed below (see PT Problem List). +2 max assist for bed mobility and to transfer to recliner. Pt tremulous throughout PT eval. Pt not able to follow commands, cannot communicate needs, she is oriented x 0, but is pleasant and cooperative. SNF recommended.  Pt will benefit from skilled PT to increase their independence and safety with mobility to allow discharge to the venue listed below.       Follow Up Recommendations SNF;Supervision/Assistance - 24 hour    Equipment Recommendations  Wheelchair (measurements PT);Wheelchair cushion (measurements PT)    Recommendations for Other Services       Precautions / Restrictions Precautions Precautions: Fall Precaution Comments: pt confused, unable to provide fall hx; per chart, pt fell prior to admission Restrictions Weight Bearing Restrictions: No      Mobility  Bed Mobility Overal bed mobility: Needs Assistance Bed Mobility: Rolling;Sidelying to Sit Rolling: +2 for safety/equipment;+2 for physical assistance;Max assist Sidelying to sit: +2 for physical assistance;+2 for safety/equipment;Max assist       General bed mobility comments: assist to initiate roll and to raise trunk  Transfers Overall transfer level: Needs assistance Equipment used: 2 person hand held assist Transfers: Sit to/from Stand;Stand Pivot Transfers Sit to Stand: Max assist;+2 physical assistance;+2 safety/equipment Stand pivot transfers: Max assist;+2 physical assistance;+2  safety/equipment       General transfer comment: +2 max assist to stand and pivot  Ambulation/Gait                Stairs            Wheelchair Mobility    Modified Rankin (Stroke Patients Only)       Balance Overall balance assessment: Needs assistance   Sitting balance-Leahy Scale: Poor Sitting balance - Comments: min to min/guard for mild posterior lean initially Postural control: Posterior lean                                   Pertinent Vitals/Pain Faces Pain Scale: Hurts little more Pain Location: pt grimmaced and held R lateral chest near axilla, but unable to verbalize if she was in pain due to confusion Pain Descriptors / Indicators: Grimacing Pain Intervention(s): Limited activity within patient's tolerance;Monitored during session    Home Living Family/patient expects to be discharged to:: Skilled nursing facility                 Additional Comments: admitted from [facility], pt not oriented so unable to provide hx, 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[place]    Prior Function           Comments: unknown, pt not oriented, no family present     Hand Dominance        Extremity/Trunk Assessment   Upper Extremity Assessment Upper Extremity Assessment: Defer to OT evaluation    Lower Extremity Assessment Lower Extremity Assessment: Generalized weakness    Cervical / Trunk Assessment Cervical / Trunk Assessment: Normal  Communication  Communication: No difficulties  Cognition Arousal/Alertness: Awake/alert Behavior During Therapy: WFL for tasks assessed/performed Overall Cognitive Status: No family/caregiver present to determine baseline cognitive functioning                                 General Comments: pt not able to state her birthdate, nor follow commands, cannot consistently answer yes/no questions, but pleasant and smiling, pt tremulous throughout PT Eval, RN  aware      General Comments      Exercises     Assessment/Plan    PT Assessment Patient needs continued PT services  PT Problem List Decreased strength;Decreased balance;Decreased cognition;Decreased activity tolerance       PT Treatment Interventions Gait training;Functional mobility training;Therapeutic activities;Balance training;Patient/family education;Therapeutic exercise    PT Goals (Current goals can be found in the Care Plan section)  Acute Rehab PT Goals PT Goal Formulation: Patient unable to participate in goal setting Time For Goal Achievement: 04/15/17 Potential to Achieve Goals: Fair    Frequency Min 2X/week   Barriers to discharge        Co-evaluation PT/OT/SLP Co-Evaluation/Treatment: Yes Reason for Co-Treatment: Complexity of the patient's impairments (multi-system involvement);Necessary to address cognition/behavior during functional activity;For patient/therapist safety PT goals addressed during session: Mobility/safety with mobility;Balance         AM-PAC PT "6 Clicks" Daily Activity  Outcome Measure Difficulty turning over in bed (including adjusting bedclothes, sheets and blankets)?: Unable Difficulty moving from lying on back to sitting on the side of the bed? : Unable Difficulty sitting down on and standing up from a chair with arms (e.g., wheelchair, bedside commode, etc,.)?: Unable Help needed moving to and from a bed to chair (including a wheelchair)?: Total Help needed walking in hospital room?: Total Help needed climbing 3-5 steps with a railing? : Total 6 Click Score: 6    End of Session Equipment Utilized During Treatment: Gait belt Activity Tolerance: Patient tolerated treatment well Patient left: in chair;with call bell/phone within reach;with chair alarm set;with nursing/sitter in room;Other (comment)(video monitor on) Nurse Communication: Mobility status;Other (comment)(pt tremulous) PT Visit Diagnosis: Unsteadiness on feet  (R26.81);History of falling (Z91.81);Difficulty in walking, not elsewhere classified (R26.2)    Time: 2355-7322 PT Time Calculation (min) (ACUTE ONLY): 22 min   Charges:   PT Evaluation $PT Eval Moderate Complexity: 1 Mod     PT G Codes:          Philomena Doheny 04/01/2017, 9:52 AM [phone]

## 2017-04-02 DIAGNOSIS — Z9181 History of falling: Secondary | ICD-10-CM | POA: Diagnosis not present

## 2017-04-02 DIAGNOSIS — F0150 Vascular dementia without behavioral disturbance: Secondary | ICD-10-CM | POA: Diagnosis not present

## 2017-04-02 DIAGNOSIS — N390 Urinary tract infection, site not specified: Secondary | ICD-10-CM | POA: Diagnosis not present

## 2017-04-02 DIAGNOSIS — R4182 Altered mental status, unspecified: Secondary | ICD-10-CM | POA: Diagnosis not present

## 2017-04-02 DIAGNOSIS — Z23 Encounter for immunization: Secondary | ICD-10-CM | POA: Diagnosis not present

## 2017-04-02 DIAGNOSIS — N3000 Acute cystitis without hematuria: Secondary | ICD-10-CM | POA: Diagnosis not present

## 2017-04-02 DIAGNOSIS — I10 Essential (primary) hypertension: Secondary | ICD-10-CM | POA: Diagnosis not present

## 2017-04-02 DIAGNOSIS — R1312 Dysphagia, oropharyngeal phase: Secondary | ICD-10-CM | POA: Diagnosis not present

## 2017-04-02 DIAGNOSIS — Z7189 Other specified counseling: Secondary | ICD-10-CM | POA: Diagnosis not present

## 2017-04-02 DIAGNOSIS — K5909 Other constipation: Secondary | ICD-10-CM | POA: Diagnosis not present

## 2017-04-02 DIAGNOSIS — R627 Adult failure to thrive: Secondary | ICD-10-CM | POA: Diagnosis not present

## 2017-04-02 DIAGNOSIS — R278 Other lack of coordination: Secondary | ICD-10-CM | POA: Diagnosis not present

## 2017-04-02 DIAGNOSIS — K5900 Constipation, unspecified: Secondary | ICD-10-CM | POA: Diagnosis not present

## 2017-04-02 DIAGNOSIS — R5381 Other malaise: Secondary | ICD-10-CM | POA: Diagnosis not present

## 2017-04-02 DIAGNOSIS — R2689 Other abnormalities of gait and mobility: Secondary | ICD-10-CM | POA: Diagnosis not present

## 2017-04-02 DIAGNOSIS — M6281 Muscle weakness (generalized): Secondary | ICD-10-CM | POA: Diagnosis not present

## 2017-04-02 DIAGNOSIS — G9341 Metabolic encephalopathy: Secondary | ICD-10-CM | POA: Diagnosis not present

## 2017-04-02 DIAGNOSIS — Z87898 Personal history of other specified conditions: Secondary | ICD-10-CM | POA: Diagnosis not present

## 2017-04-02 LAB — BASIC METABOLIC PANEL
ANION GAP: 5 (ref 5–15)
BUN: 15 mg/dL (ref 6–20)
CALCIUM: 8.7 mg/dL — AB (ref 8.9–10.3)
CO2: 25 mmol/L (ref 22–32)
Chloride: 108 mmol/L (ref 101–111)
Creatinine, Ser: 0.63 mg/dL (ref 0.44–1.00)
GLUCOSE: 93 mg/dL (ref 65–99)
POTASSIUM: 4.4 mmol/L (ref 3.5–5.1)
Sodium: 138 mmol/L (ref 135–145)

## 2017-04-02 LAB — GLUCOSE, CAPILLARY
GLUCOSE-CAPILLARY: 89 mg/dL (ref 65–99)
GLUCOSE-CAPILLARY: 116 mg/dL — AB (ref 65–99)
GLUCOSE-CAPILLARY: 93 mg/dL (ref 65–99)

## 2017-04-02 LAB — MAGNESIUM: MAGNESIUM: 1.9 mg/dL (ref 1.7–2.4)

## 2017-04-02 MED ORDER — CARVEDILOL 3.125 MG PO TABS
3.1250 mg | ORAL_TABLET | Freq: Two times a day (BID) | ORAL | Status: DC
  Administered 2017-04-02 (×2): 3.125 mg via ORAL
  Filled 2017-04-02 (×2): qty 1

## 2017-04-02 MED ORDER — ENSURE ENLIVE PO LIQD: 237.0000 mL | Freq: Two times a day (BID) | ORAL | 12 refills | Status: AC

## 2017-04-02 MED ORDER — MAGNESIUM OXIDE 400 MG PO TABS: 400.0000 mg | ORAL_TABLET | Freq: Every day | ORAL | 0 refills | Status: AC

## 2017-04-02 MED ORDER — CYANOCOBALAMIN 1000 MCG PO TABS: 1000.0000 ug | ORAL_TABLET | Freq: Every day | ORAL | 3 refills | Status: AC

## 2017-04-02 MED ORDER — CARVEDILOL 3.125 MG PO TABS: 3.1250 mg | ORAL_TABLET | Freq: Two times a day (BID) | ORAL | 0 refills | Status: AC

## 2017-04-02 MED ORDER — POLYETHYLENE GLYCOL 3350 17 G PO PACK: 17.0000 g | PACK | Freq: Every day | ORAL | 0 refills | Status: AC

## 2017-04-02 MED ORDER — AMOXICILLIN-POT CLAVULANATE 500-125 MG PO TABS: 1.0000 | ORAL_TABLET | Freq: Two times a day (BID) | ORAL | 0 refills | Status: AC

## 2017-04-02 MED ORDER — LORAZEPAM 1 MG PO TABS
1.0000 mg | ORAL_TABLET | Freq: Once | ORAL | Status: AC
  Administered 2017-04-02: 1 mg via ORAL
  Filled 2017-04-02: qty 1

## 2017-04-02 MED ORDER — ONDANSETRON HCL 4 MG PO TABS: 4.0000 mg | ORAL_TABLET | Freq: Three times a day (TID) | ORAL | Status: DC | PRN

## 2017-04-02 MED ORDER — SENNOSIDES-DOCUSATE SODIUM 8.6-50 MG PO TABS: 1.0000 | ORAL_TABLET | Freq: Every day | ORAL | 0 refills | Status: AC

## 2017-04-02 NOTE — Clinical Social Work Placement (Signed)
   CLINICAL SOCIAL WORK PLACEMENT  NOTE  Date:  04/02/2017  Patient Details  Name: Rachael Perez MRN: [id number] Date of Birth: 11/04/1926  Clinical Social Work is seeking post-discharge placement for this patient at the [place] level of care (*CSW will initial, date and re-position this form in  chart as items are completed):  Yes   Patient/family provided with [facility] Work Department's list of facilities offering this level of care within the geographic area requested by the patient (or if unable, by the patient's family).  Yes   Patient/family informed of their freedom to choose among providers that offer the needed level of care, that participate in Medicare, Medicaid or managed care program needed by the patient, have an available bed and are willing to accept the patient.  Yes   Patient/family informed of [facility]'s ownership interest in [facility] and [facility], as well as of the fact that they are under no obligation to receive care at these facilities.  PASRR submitted to EDS on       PASRR number received on       Existing PASRR number confirmed on 04/02/17     FL2 transmitted to all facilities in geographic area requested by pt/family on       FL2 transmitted to all facilities within larger geographic area on 04/01/17     Patient informed that his/her managed care company has contracts with or will negotiate with certain facilities, including the following:  [employer]     Yes   Patient/family informed of bed offers received.  Patient chooses bed at [facility]     Physician recommends and patient chooses bed at      Patient to be transferred to [facility] on 04/02/17.  Patient to be transferred to facility by [facility]      Patient family notified on 04/02/17 of transfer.  Name of family member notified:  Son-David     PHYSICIAN Please prepare priority discharge summary, including medications      Additional Comment:    _______________________________________________ Lia Hopping, LCSW 04/02/2017, 2:35 PM

## 2017-04-02 NOTE — Progress Notes (Signed)
Relayed results of bladder scan to Dr. Erlinda Hong. Verbal orders to have SNF continue to monitor pt's urine output and refer to urology if needed. Will report to SNF @ report.

## 2017-04-02 NOTE — Progress Notes (Signed)
CSW provided bed offers to son [name], waiting for SNF choice.   Kathrin Greathouse, Latanya Presser, MSW Clinical Social Worker  [phone] 04/02/2017  10:44 AM

## 2017-04-02 NOTE — Discharge Summary (Addendum)
Discharge Summary  Rachael Perez TDV:[id number] DOB: September 23, 1926  PCP: Gayland Curry, DO  Admit date: 03/30/2017 Discharge date: 04/02/2017  Time spent: >54mins, more than 50% time spent on coordination of care.  Recommendations for Outpatient Follow-up:  1. F/u with SNF MD  for hospital discharge follow up, repeat cbc/bmp at follow up. 2. Palliative care to follow patient at SNF.   Discharge Diagnoses:  Active Hospital Problems   Diagnosis Date Noted  . UTI (urinary tract infection) 01/31/2014    Resolved Hospital Problems  No resolved problems to display.    Discharge Condition: stable  Diet recommendation: regular diet  Filed Weights   03/30/17 1728 03/30/17 2159  Weight: 51.3 kg (113 lb) 49.3 kg (108 lb 11.2 oz)    History of present illness:  PCP: Gayland Curry, DO Patient coming from: Assisted Living  Chief Complaint: AMS  HPI: Rachael Perez is a 83 y.o. female with medical history significant of vascular dementia, depression, general anxiety disorder came to the hospital for evaluation of change in mental status.  Patient has advanced dementia and currently more altered than baseline due to underlying infection therefore history is limited.  No family members at bedside.  Per the ED staff patient was acting unusual yesterday with questionable slurred speech therefore she was brought to [facility] ER.  CT of the head was negative and she was diagnosed with urinary tract infection.  After getting IV Rocephin she was sent back to the nursing facility on Keflex.  Also at [facility] she was evaluated by neurology who did not think patient was having stroke as her symptoms were secondary to urinary tract infection. After going back to the facility patient continued to remain altered and it does not appear that the prescription for Keflex was failed.  Due to persistent altered mental status and unable to ambulate much she was brought back to the hospital  again.  During this time she is also had poor intake.  In the ER she was noted has dirty UA. Her mentation was AAOx 1 but was told her baseline is AAOx2 and able to perform some basic task on her own.  Due to poor oral intake she was started on IV antibiotics here along with fluids and admitted for further treatment.    Hospital Course:  Active Problems:   UTI (urinary tract infection)   Metabolic encephalopathy with baseline progressive dementia -She was sent to ED on 12/10 due to confusion and fall at independent living, ct head was negative, she was evaluated by neurology in the ED , she is discharged back to independent living with oral abx for possible uti. -she is sent back to ED on 12/12 due to continue confusion and lethargy --repeat ct head no acute findings , rpr negative, b12 low, start b12 supplement, continue asa. -improving, d/c to SNF with palliative care following.  Urinary retention with possible uti: could have underline neurogenic bladder. -PVR 286cc, place foley. Start flomax. Start bowel regimen (kub with constipation).  -ua on admission wbc TNTC, she is started on rocephin and ivf on admission. She is discharge on oral augmentin for another day to finish total of 5 days abx treatment. -voiding trial after bm, SNF MD to refer patient to urology if patient continue to have urinary retention issues  AKI:  Cr 1.08 on admission, cr decreased to 0.63 with ivf and abx   Hypokalemia/hypomagnesemia; replaced k/mag   HTN: does not appear to be on any bp  meds at home Started on low dose coreg at discharge, SNF MD to continue titrate coreg dose.  H/o vascular Dementia: report progressive pmd recently started on remeron a week ago due to progressive confusion  tsh/folate/rpr unremarkable Repeat ct head not acute findings.  b12 mildly low, start b12 supplement  H/o breast cancer s/p bilateral lumpectomy   FTT: with progressive confusion, falls,  Discharge to  SNF with palliative care following  Code Status: DNR, confirmed with son Dr Baldo Daub  Family Communication: patient , son Dr Baldo Daub over the phone daily.   Disposition Plan: snf on 12/14   Consultants:  PT/OT/social worker  Procedures:  none  Antibiotics:  Rocephin since admission  Discharge Exam: BP (!) 164/89 (BP Location: Left Arm)   Pulse 79   Temp 98.9 F (37.2 C) (Oral)   Resp 20   Ht 5\' 8"  (1.727 m)   Wt 49.3 kg (108 lb 11.2 oz)   SpO2 99%   BMI 16.53 kg/m     General:  Frail, thin, NAD, only oriented to self, very hard of hearing  Cardiovascular: RRR  Respiratory: CTABL  Abdomen: Soft, positive BS, nontender  Musculoskeletal: No Edema  Neuro: alert, only oriented to self, calm   Discharge Instructions You were cared for by a hospitalist during your hospital stay. If you have any questions about your discharge medications or the care you received while you were in the hospital after you are discharged, you can call the unit and asked to speak with the hospitalist on call if the hospitalist that took care of you is not available. Once you are discharged, your primary care physician will handle any further medical issues. Please note that NO REFILLS for any discharge medications will be authorized once you are discharged, as it is imperative that you return to your primary care physician (or establish a relationship with a primary care physician if you do not have one) for your aftercare needs so that they can reassess your need for medications and monitor your lab values.  Discharge Instructions    Diet - low sodium heart healthy   Complete by:  As directed    Increase activity slowly   Complete by:  As directed      Allergies as of 04/02/2017      Reactions   Codeine Shortness Of Breath   Lexapro [escitalopram Oxalate] Other (See Comments)   "Wiped patient out"   Lodine [etodolac]    Namenda [memantine Hcl] Other (See Comments)   "Wiped  patient out"      Medication List    STOP taking these medications   cephALEXin 500 MG capsule Commonly known as:  KEFLEX     TAKE these medications   amoxicillin-clavulanate 500-125 MG tablet Commonly known as:  AUGMENTIN Take 1 tablet (500 mg total) by mouth 2 (two) times daily for 1 day.   aspirin 325 MG tablet Take 1 tablet (325 mg total) by mouth daily.   carvedilol 3.125 MG tablet Commonly known as:  COREG Take 1 tablet (3.125 mg total) by mouth 2 (two) times daily with a meal.   cyanocobalamin 1000 MCG tablet Take 1 tablet (1,000 mcg total) by mouth daily. Start taking on:  04/03/2017   feeding supplement (ENSURE ENLIVE) Liqd Take 237 mLs by mouth 2 (two) times daily between meals.   magnesium oxide 400 MG tablet Commonly known as:  MAG-OX Take 1 tablet (400 mg total) by mouth daily.   mirtazapine 7.5 MG tablet Commonly known  as:  REMERON Take 1 tablet (7.5 mg total) by mouth at bedtime.   polyethylene glycol packet Commonly known as:  MIRALAX / GLYCOLAX Take 17 g by mouth daily. Start taking on:  04/03/2017   senna-docusate 8.6-50 MG tablet Commonly known as:  Senokot-S Take 1 tablet by mouth at bedtime.      Allergies  Allergen Reactions  . Codeine Shortness Of Breath  . Lexapro [Escitalopram Oxalate] Other (See Comments)    "Wiped patient out"  . Lodine [Etodolac]   . Namenda [Memantine Hcl] Other (See Comments)    "Wiped patient out"    Contact information for follow-up providers    Reed, Tiffany L, DO Follow up.   Specialty:  Geriatric Medicine Contact information: [address] [place] [address] [phone]        follow up with urology as needed for urinary retention. Follow up.            Contact information for after-discharge care    Destination    [facility] Follow up.   Service:  Skilled Nursing Contact information: [place] [phone]                    The results of significant diagnostics from this hospitalization (including imaging, microbiology, ancillary and laboratory) are listed below for reference.    Significant Diagnostic Studies: Dg Abd 1 View  Result Date: 04/01/2017 CLINICAL DATA:  Abdominal pain and distension. Altered mental status. EXAM: ABDOMEN - 1 VIEW COMPARISON:  05/22/2011.  CT 03/06/2005 FINDINGS: Large amount of fecal matter within the colon. No gaseous distention. No dilated small bowel. Chronic arterial calcification. Chronic pelvic nodal calcification. Chronic osteoarthritis of the hips. IMPRESSION: Possible constipation.  No sign of bowel obstruction. Aortic atherosclerosis. Electronically Signed   By: Nelson Chimes M.D.   On: 04/01/2017 15:06   Ct Head Wo Contrast  Result Date: 03/31/2017 CLINICAL DATA:  83 y/o female with confusion, unexplained altered mental status. Unsteadiness with recent fall. EXAM: CT HEAD WITHOUT CONTRAST TECHNIQUE: Contiguous axial images were obtained from the base of the skull through the vertex without intravenous contrast. COMPARISON:  Head CT without contrast 03/29/2017. Brain MRI 01/31/2014 FINDINGS: Brain: Stable cerebral volume since 2015. Patchy chronic white matter changes and mild heterogeneity in the left thalamus Re demonstrated and stable. No midline shift, ventriculomegaly, mass effect, evidence of mass lesion, intracranial hemorrhage or evidence of cortically based acute infarction. No cortical encephalomalacia identified. No cortical encephalomalacia identified. Vascular: Calcified atherosclerosis at the skull base. Skull: No acute osseous abnormality identified. Sinuses/Orbits: Visualized paranasal sinuses and mastoids are stable and well pneumatized. Other: No acute orbit or scalp soft tissue findings. IMPRESSION: Stable non contrast CT appearance of the brain since 03/29/2017. No acute intracranial abnormality. Electronically Signed   By: Genevie Ann M.D.   On: 03/31/2017 14:02    Ct Head Code Stroke Wo Contrast  Result Date: 03/29/2017 CLINICAL DATA:  Code stroke. 83 year old female with slurred speech. Last seen normal 1025 hours. EXAM: CT HEAD WITHOUT CONTRAST TECHNIQUE: Contiguous axial images were obtained from the base of the skull through the vertex without intravenous contrast. COMPARISON:  Brain MRI 01/31/2014, intracranial MRA, and head CT 01/30/2014. FINDINGS: Brain: Decreased cerebral volume since 2015 appears generalized. No midline shift, mass effect, or evidence of intracranial mass lesion. No acute intracranial hemorrhage identified. No ventriculomegaly. Patchy bilateral cerebral white matter hypodensity is more apparent by CT but appears similar to the MRI  appearance in 2015. Heterogeneity in the left thalamus is new since 2015. Other deep gray matter nuclei appear stable. Stable brainstem and cerebellum. No cortical encephalomalacia or acute cortically based infarct identified. Vascular: Extensive Calcified atherosclerosis at the skull base. No suspicious intracranial vascular hyperdensity. Skull: No acute osseous abnormality identified. Sinuses/Orbits: Visualized paranasal sinuses and mastoids are stable and well pneumatized. Other: No acute orbit or scalp soft tissue findings. ASPECTS [facility] Stroke Program Early CT Score) - Ganglionic level infarction (caudate, lentiform nuclei, internal capsule, insula, M1-M3 cortex): 7 - Supraganglionic infarction (M4-M6 cortex): 3 Total score (0-10 with 10 being normal): 10 IMPRESSION: 1. No acute cortically based infarct or acute intracranial hemorrhage identified. 2. ASPECTS is 10. 3. Progression of chronic bilateral white matter disease, and possible new involvement of the left thalamus, since 2015 comparisons. 4. These results were communicated to Dr. Leonel Ramsay at 1241 hours on 03/29/2017 by text page via the [facility] messaging system. Electronically Signed   By: Genevie Ann M.D.   On: 03/29/2017 12:44     Microbiology: Recent Results (from the past 240 hour(s))  Urine culture     Status: None   Collection Time: 03/29/17  4:04 PM  Result Value Ref Range Status   Specimen Description URINE, CLEAN CATCH  Final   Special Requests Normal  Final   Culture NO GROWTH  Final   Report Status 03/30/2017 FINAL  Final  Urine culture     Status: None   Collection Time: 03/30/17  7:20 PM  Result Value Ref Range Status   Specimen Description URINE, RANDOM  Final   Special Requests NONE  Final   Culture   Final    NO GROWTH Performed at [facility], [place] [address], [place], [place] [address]    Report Status 04/01/2017 FINAL  Final     Labs: Basic Metabolic Panel: Recent Labs  Lab 03/29/17 1223 03/29/17 1238 03/30/17 1748 03/31/17 0523 04/01/17 0501 04/02/17 0445  NA 138 140 141 141 139 138  K 4.4 4.5 4.0 3.5 3.6 4.4  CL 103 103 105 105 104 108  CO2 28  --  26 28 26 25   GLUCOSE 100* 100* 105* 86 103* 93  BUN 22* 23* 30* 19 21* 15  CREATININE 0.92 0.80 1.08* 0.67 0.66 0.63  CALCIUM 9.1  --  9.2 9.0 9.0 8.7*  MG  --   --   --   --  1.6* 1.9   Liver Function Tests: Recent Labs  Lab 03/29/17 1223 03/30/17 1748 03/31/17 0523  AST 20 19 21   ALT 10* 11* 9*  ALKPHOS 61 61 54  BILITOT 0.8 0.4 0.8  PROT 6.8 6.9 6.9  ALBUMIN 3.5 3.5 3.4*   No results for input(s): LIPASE, AMYLASE in the last 168 hours. No results for input(s): AMMONIA in the last 168 hours. CBC: Recent Labs  Lab 03/29/17 1223 03/29/17 1238 03/30/17 1748 03/31/17 0523  WBC 9.2  --  9.5 7.9  NEUTROABS 7.0  --  7.1  --   HGB 14.1 15.0 14.0 13.9  HCT 43.9 44.0 42.8 42.9  MCV 95.4  --  93.9 93.7  PLT 248  --  253 254   Cardiac Enzymes: No results for input(s): CKTOTAL, CKMB, CKMBINDEX, TROPONINI in the last 168 hours. BNP: BNP (last 3 results) No results for input(s): BNP in the last 8760 hours.  ProBNP (last 3 results) No results for input(s): PROBNP in the last 8760 hours.  CBG: Recent  Labs  Lab 04/01/17 1642 04/01/17 2132 04/02/17 0833 04/02/17 1241 04/02/17 1715  GLUCAP 126* 255* 89 116* 93       Signed:  Florencia Reasons MD, PhD  [facility] 04/02/2017, 7:08 PM

## 2017-04-02 NOTE — Progress Notes (Signed)
Physical Therapy Treatment Patient Details Name: Rachael Perez MRN: [id number] DOB: 10-14-1926 Today's Date: 04/02/2017    History of Present Illness 83 y.o. female with medical history significant of vascular dementia, depression, HTN, breast cancer, general anxiety disorder came to the hospital from ALF for evaluation of change in mental status.  Dx of UTI.     PT Comments    +2 max assist for bed mobility and to transfer to recliner. Pt oriented to self only, but very pleasant.    Follow Up Recommendations  SNF;Supervision/Assistance - 24 hour     Equipment Recommendations  Wheelchair (measurements PT);Wheelchair cushion (measurements PT)    Recommendations for Other Services       Precautions / Restrictions Precautions Precautions: Fall Precaution Comments: pt confused, unable to provide fall hx; per chart, pt fell prior to admission Restrictions Weight Bearing Restrictions: No    Mobility  Bed Mobility Overal bed mobility: Needs Assistance Bed Mobility: Rolling;Sidelying to Sit Rolling: +2 for safety/equipment;+2 for physical assistance;Max assist Sidelying to sit: +2 for physical assistance;+2 for safety/equipment;Max assist       General bed mobility comments: assist to initiate roll and to raise trunk  Transfers Overall transfer level: Needs assistance Equipment used: 2 person hand held assist Transfers: Sit to/from Stand;Stand Pivot Transfers Sit to Stand: Max assist;+2 physical assistance;+2 safety/equipment Stand pivot transfers: Max assist;+2 physical assistance;+2 safety/equipment       General transfer comment: +2 max assist to stand and pivot; pt stood with RW for 2 minutes with min A for pericare  Ambulation/Gait                 Stairs            Wheelchair Mobility    Modified Rankin (Stroke Patients Only)       Balance     Sitting balance-Leahy Scale: Poor Sitting balance - Comments: min to min/guard for mild  posterior lean initially Postural control: Posterior lean   Standing balance-Leahy Scale: Poor                              Cognition Arousal/Alertness: Awake/alert Behavior During Therapy: WFL for tasks assessed/performed Overall Cognitive Status: No family/caregiver present to determine baseline cognitive functioning                                 General Comments: per chart, pt has vascular dementia, oriented to self only      Exercises      General Comments        Pertinent Vitals/Pain Faces Pain Scale: No hurt    Home Living                      Prior Function            PT Goals (current goals can now be found in the care plan section) Acute Rehab PT Goals Patient Stated Goal: none stated PT Goal Formulation: Patient unable to participate in goal setting Time For Goal Achievement: 04/15/17 Potential to Achieve Goals: Fair Progress towards PT goals: Progressing toward goals    Frequency    Min 2X/week      PT Plan      Co-evaluation              AM-PAC PT "6 Clicks" Daily Activity  Outcome Measure  Difficulty turning  over in bed (including adjusting bedclothes, sheets and blankets)?: Unable Difficulty moving from lying on back to sitting on the side of the bed? : Unable Difficulty sitting down on and standing up from a chair with arms (e.g., wheelchair, bedside commode, etc,.)?: Unable Help needed moving to and from a bed to chair (including a wheelchair)?: Total Help needed walking in hospital room?: Total Help needed climbing 3-5 steps with a railing? : Total 6 Click Score: 6    End of Session Equipment Utilized During Treatment: Gait belt Activity Tolerance: Patient tolerated treatment well Patient left: in chair;with call bell/phone within reach;with chair alarm set;with nursing/sitter in room;Other (comment)(video monitor on) Nurse Communication: Mobility status PT Visit Diagnosis: Unsteadiness on  feet (R26.81);History of falling (Z91.81);Difficulty in walking, not elsewhere classified (R26.2)     Time: 2297-9892 PT Time Calculation (min) (ACUTE ONLY): 18 min  Charges:  $Therapeutic Activity: 8-22 mins                    G Codes:          Philomena Doheny 04/02/2017, 10:56 AM [phone]

## 2017-04-02 NOTE — Progress Notes (Signed)
Called [facility] and spoke with Caitlyn. Gave report. She verbalized understanding. Discharge AVS and summary provided to transport team. Pt discharged via [facility] in stable condition with pain controlled and belongings.

## 2017-04-02 NOTE — Clinical Social Work Placement (Addendum)
D/C [place] sent via [place] [facility].   Nurse given number for report/ DNR signed.    CLINICAL SOCIAL WORK PLACEMENT  NOTE  Date:  04/02/2017  Patient Details  Name: Rachael Perez MRN: [id number] Date of Birth: [birth year]  Clinical Social Work is seeking post-discharge placement for this patient at the [place] level of care (*CSW will initial, date and re-position this form in  chart as items are completed):  Yes   Patient/family provided with [facility] Work Department's list of facilities offering this level of care within the geographic area requested by the patient (or if unable, by the patient's family).  Yes   Patient/family informed of their freedom to choose among providers that offer the needed level of care, that participate in Medicare, Medicaid or managed care program needed by the patient, have an available bed and are willing to accept the patient.  Yes   Patient/family informed of [facility]'s ownership interest in [facility] and [facility], as well as of the fact that they are under no obligation to receive care at these facilities.  PASRR submitted to EDS on       PASRR number received on       Existing PASRR number confirmed on 04/02/17     FL2 transmitted to all facilities in geographic area requested by pt/family on       FL2 transmitted to all facilities within larger geographic area on 04/01/17     Patient informed that his/her managed care company has contracts with or will negotiate with certain facilities, including the following:  [employer]     Yes   Patient/family informed of bed offers received.  Patient chooses bed at [facility]     Physician recommends and patient chooses bed at      Patient to be transferred to [facility] on 04/02/17.  Patient to be transferred to facility by [facility]      Patient family notified on 04/02/17 of transfer.  Name of family member  notified:  Son-David     PHYSICIAN Please prepare priority discharge summary, including medications     Additional Comment:    _______________________________________________ Lia Hopping, LCSW 04/02/2017, 3:34 PM

## 2017-04-05 ENCOUNTER — Telehealth: Payer: Self-pay

## 2017-04-05 DIAGNOSIS — K5900 Constipation, unspecified: Secondary | ICD-10-CM | POA: Diagnosis not present

## 2017-04-05 DIAGNOSIS — Z87898 Personal history of other specified conditions: Secondary | ICD-10-CM | POA: Diagnosis not present

## 2017-04-05 DIAGNOSIS — F0150 Vascular dementia without behavioral disturbance: Secondary | ICD-10-CM | POA: Diagnosis not present

## 2017-04-05 DIAGNOSIS — I10 Essential (primary) hypertension: Secondary | ICD-10-CM | POA: Diagnosis not present

## 2017-04-05 NOTE — Telephone Encounter (Signed)
I have made the 1st attempt to contact the patient or family member in charge, in order to follow up from recently being discharged from the hospital. I left a message on voicemail but I will make another attempt at a different time.  

## 2017-04-05 NOTE — Telephone Encounter (Signed)
Pts son returned your call  

## 2017-04-06 ENCOUNTER — Other Ambulatory Visit: Payer: Self-pay | Admitting: *Deleted

## 2017-04-06 DIAGNOSIS — I10 Essential (primary) hypertension: Secondary | ICD-10-CM

## 2017-04-06 NOTE — Telephone Encounter (Signed)
Called pt son back to ask TOC questions but found out pt is at [facility] currently.

## 2017-04-07 DIAGNOSIS — R5381 Other malaise: Secondary | ICD-10-CM | POA: Diagnosis not present

## 2017-04-07 DIAGNOSIS — F0150 Vascular dementia without behavioral disturbance: Secondary | ICD-10-CM | POA: Diagnosis not present

## 2017-04-07 DIAGNOSIS — Z7189 Other specified counseling: Secondary | ICD-10-CM | POA: Diagnosis not present

## 2017-04-08 ENCOUNTER — Ambulatory Visit: Payer: Medicare Other | Admitting: Nurse Practitioner

## 2017-04-08 ENCOUNTER — Other Ambulatory Visit: Payer: Self-pay | Admitting: Licensed Clinical Social Worker

## 2017-04-08 ENCOUNTER — Encounter: Payer: Self-pay | Admitting: Licensed Clinical Social Worker

## 2017-04-08 NOTE — Patient Outreach (Signed)
Rachael Perez) Care Management  [facility] Social Work  04/08/2017  Rachael Perez Jun 25, 1926 [id number]  Encounter Medications:  Outpatient Encounter Medications as of 04/08/2017  Medication Sig  . aspirin 325 MG tablet Take 1 tablet (325 mg total) by mouth daily. (Patient not taking: Reported on 03/29/2017)  . carvedilol (COREG) 3.125 MG tablet Take 1 tablet (3.125 mg total) by mouth 2 (two) times daily with a meal.  . feeding supplement, ENSURE ENLIVE, (ENSURE ENLIVE) LIQD Take 237 mLs by mouth 2 (two) times daily between meals.  . magnesium oxide (MAG-OX) 400 MG tablet Take 1 tablet (400 mg total) by mouth daily.  . mirtazapine (REMERON) 7.5 MG tablet Take 1 tablet (7.5 mg total) by mouth at bedtime.  . polyethylene glycol (MIRALAX / GLYCOLAX) packet Take 17 g by mouth daily.  Marland Kitchen senna-docusate (SENOKOT-S) 8.6-50 MG tablet Take 1 tablet by mouth at bedtime.  . vitamin B-12 1000 MCG tablet Take 1 tablet (1,000 mcg total) by mouth daily.   No facility-administered encounter medications on file as of 04/08/2017.     Functional Status:  In your present state of health, do you have any difficulty performing the following activities: 03/30/2017  Hearing? N  Vision? N  Difficulty concentrating or making decisions? Y  Walking or climbing stairs? N  Dressing or bathing? Y  Doing errands, shopping? Y  Some recent data might be hidden    Fall/Depression Screening:  PHQ 2/9 Scores 11/18/2015 10/07/2015 05/18/2014 09/28/2013 09/02/2012  PHQ - 2 Score 0 0 0 0 0    Assessment: CSW arrived at [name] in order to complete SNF visit and assessment after receiving new referral on patient on 04/06/17. Patient was in her room being hand fed by her speech therapist when [place] arrived. Speech therapist reports that patient has been very lethargic since her arrival at SNF. Speech therapist reports that patient has had a lot of difficulty eating and drinking fluids. Patient is  able to feed herself but does not have much motivation to do so which is why SNF is providing assistance. Speech therapist reports that there has been some discussion in regards to hospice involvement but that she is not sure if an order has been made. CSW was informed that patient was able to participate in both PT and OT today. Patient does not respond to most assessment questions when prompted. Speech therapist reports that patient has only talked with her some. CSW sat down beside patient's bed and was able to engage with patient better at that position. Patient reports that she is feeling "okay." She is unable to state her address but confirms her full name and DOB. Patient is from [place] and presented to ED on 03/30/17 with altered mental status. Patient was able to sign consent for CSW today and gave permission for [place] [facility] to discuss her care with both of her son's. CSW completed call to patient's son [name] in order to complete assessment over the phone but was unable to reach him. CSW left a HIPPA compliant voice message and encouraged return call once available. CSW fell asleep during SNF visit and CSW went to SNF social worker's office to discuss case.  CSW met with SNF social worker Lilia Pro. She reports that she talked to patient's son yesterday and contacted him again today to discuss Hospice and [place] involvement but was unable to reach him. Lilia Pro reports that she will need to gain consent from son before they  pursue Hospice. CSW was informed that patient's health has been on a steady decline over the last 6 months according to patient's son [name]. SNF social worker agreeable to keep CSW updated.  [facility] CM Care Plan Problem One     Most Recent Value  Care Plan Problem One  SNF admission  Role Documenting the Problem One  Clinical Social Worker  Care Plan for Problem One  Active  [facility] Long Term Goal   Patient will have a safe and stable discharge back  home or to another LTC facility within 90 days as evidenced by SNF social worker or family  [facility] Long Term Goal Start Date  04/08/17  Interventions for Problem One Long Term Goal  CSW completed SNF visit today. Patient was very lethargic. CSW contacted family and left a message. CSW also met with SNF social worker to coordinate care. CSW will ensure that patient has all services, resources and equipment needed in order to have a safe discharge back home once discharge has been scheduled.  [facility] CM Short Term Goal #1   Patient will gain Hospice and Palliative services within 30 days as evidenced by SNF social worker   [facility] CM Short Term Goal #1 Start Date  04/08/17  Interventions for Short Term Goal #1  CSW met with SNF social worker and was informed that they will be making a referral for Hospice involvement once they receive family's consent. CSW will follow this referral process closely      Plan: CSW unable to complete assessment today. CSW will follow up within one week with SNF. CSW will await for return call from patient's son or complete additional outreach attempt to him within two weeks.  Eula Fried, BSW, MSW, [email or web address] Phone: [phone] Fax: [phone]

## 2017-04-09 ENCOUNTER — Other Ambulatory Visit: Payer: Self-pay | Admitting: Licensed Clinical Social Worker

## 2017-04-09 NOTE — Patient Outreach (Signed)
[facility]) Care Management  04/09/2017  KYRIA BUMGARDNER October 27, 1926 [id number]  Assessment- CSW received return call from patient's son on 04/08/17. CSW introduced self, reason for call and of [place] [facility] services. Son reports that he is the POA for patient. He shares that [place] Psychologist, sport and exercise that also assist him with patient's care. Son reports that he received a text message from brother that he has talked with SNF social worker and it seems as if patient will resume plans for rehab and will have palliative care involvement and then it will be decided if patient is a candidate for hospice. Son shares that the family has had discussion in regards to relocating patient from [place] to [place]. Family agreeable to keep CSW updated with any important updates on patient.   Plan-CSW will follow up with SNF within one week and continue to follow patient and await for SNF discharge.  Eula Fried, BSW, MSW, [email or web address] Phone: [phone] Fax: [phone]

## 2017-04-12 ENCOUNTER — Other Ambulatory Visit: Payer: Self-pay | Admitting: Licensed Clinical Social Worker

## 2017-04-12 DIAGNOSIS — Z9181 History of falling: Secondary | ICD-10-CM | POA: Diagnosis not present

## 2017-04-12 DIAGNOSIS — F0150 Vascular dementia without behavioral disturbance: Secondary | ICD-10-CM | POA: Diagnosis not present

## 2017-04-12 NOTE — Patient Outreach (Signed)
[facility]) Care Management  04/12/2017  Rachael Perez 1926-11-13 [id number]  Assessment- CSW completed call to [place], [place] at [facility] in order to gain updates on patient. CSW was unsuccessful in reaching [place] but left a HIPPA compliat voice message encouraging a return call once available in order to gain updates on patient in regards to palliative/hospice involvement.    Plan-CSW will await for return call or complete additional outreach attempt within one week.  Eula Fried, BSW, MSW, [email or web address] Phone: [phone] Fax: [phone]

## 2017-04-14 ENCOUNTER — Other Ambulatory Visit: Payer: Self-pay | Admitting: Licensed Clinical Social Worker

## 2017-04-14 NOTE — Patient Outreach (Signed)
[facility]) Care Management    KATHERINNE MOFIELD 10/29/1926 [id number]  Assessment-CSW completed outreach attempt today to patient's son [name]  to see if there had been a decision made on Hospice/pallaitive care. CSW unable to reach patient's son successfully. CSW left a HIPPA compliant voice message encouraging him to return call once available.  Plan-CSW will await return call. CSW will follow up with SNF within one week in order to gain appropriate updates on patient.   Eula Fried, BSW, MSW, [email or web address] Phone: [phone] Fax: [phone]

## 2017-04-15 ENCOUNTER — Other Ambulatory Visit: Payer: Self-pay | Admitting: Licensed Clinical Social Worker

## 2017-04-15 NOTE — Patient Outreach (Addendum)
Rachael Perez [facility]) Care Management  04/15/2017  Rachael Perez 01/09/1927 [id number]  Assessment- CSW completed call to [facility] and spoke with SNF social worker Lilia Pro. CSW was informed that patient passed away yesterday. CSW will complete case closure at this time and will update [facility] Care Management Assistant.  Plan-CSW will inform PCP and [facility] Care Management Assistant of case closure.  Eula Fried, BSW, MSW, [email or web address] Phone: [phone] Fax: [phone]

## 2017-04-20 DEATH — deceased

## 2019-01-13 IMAGING — DX DG ABDOMEN 1V
2 series · 2 of 2 positions shown · non-contrast
Comparison: 05/22/2011.  CT 03/06/2005

CLINICAL DATA: Abdominal pain and distension. Altered mental
status.

EXAM:
ABDOMEN - 1 VIEW

[abdomen kub (1 of 2)]
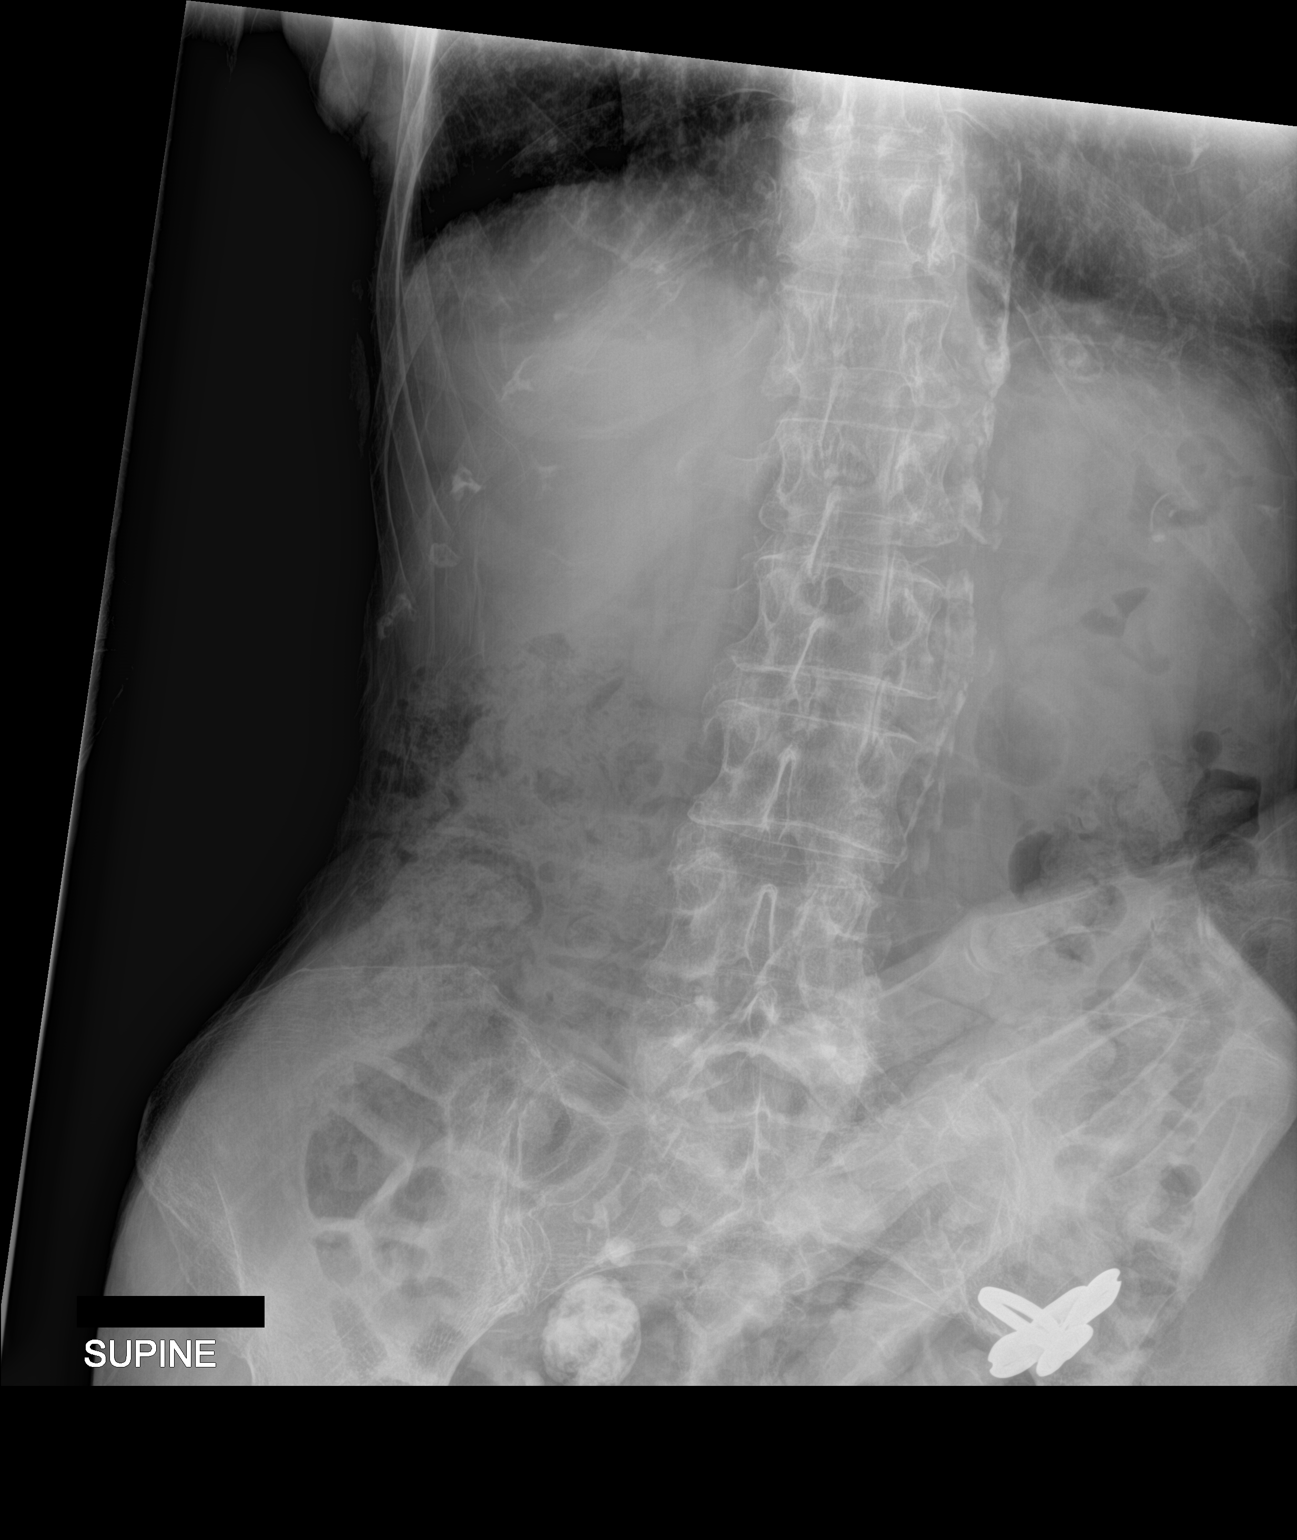

[abdomen kub (2 of 2)]
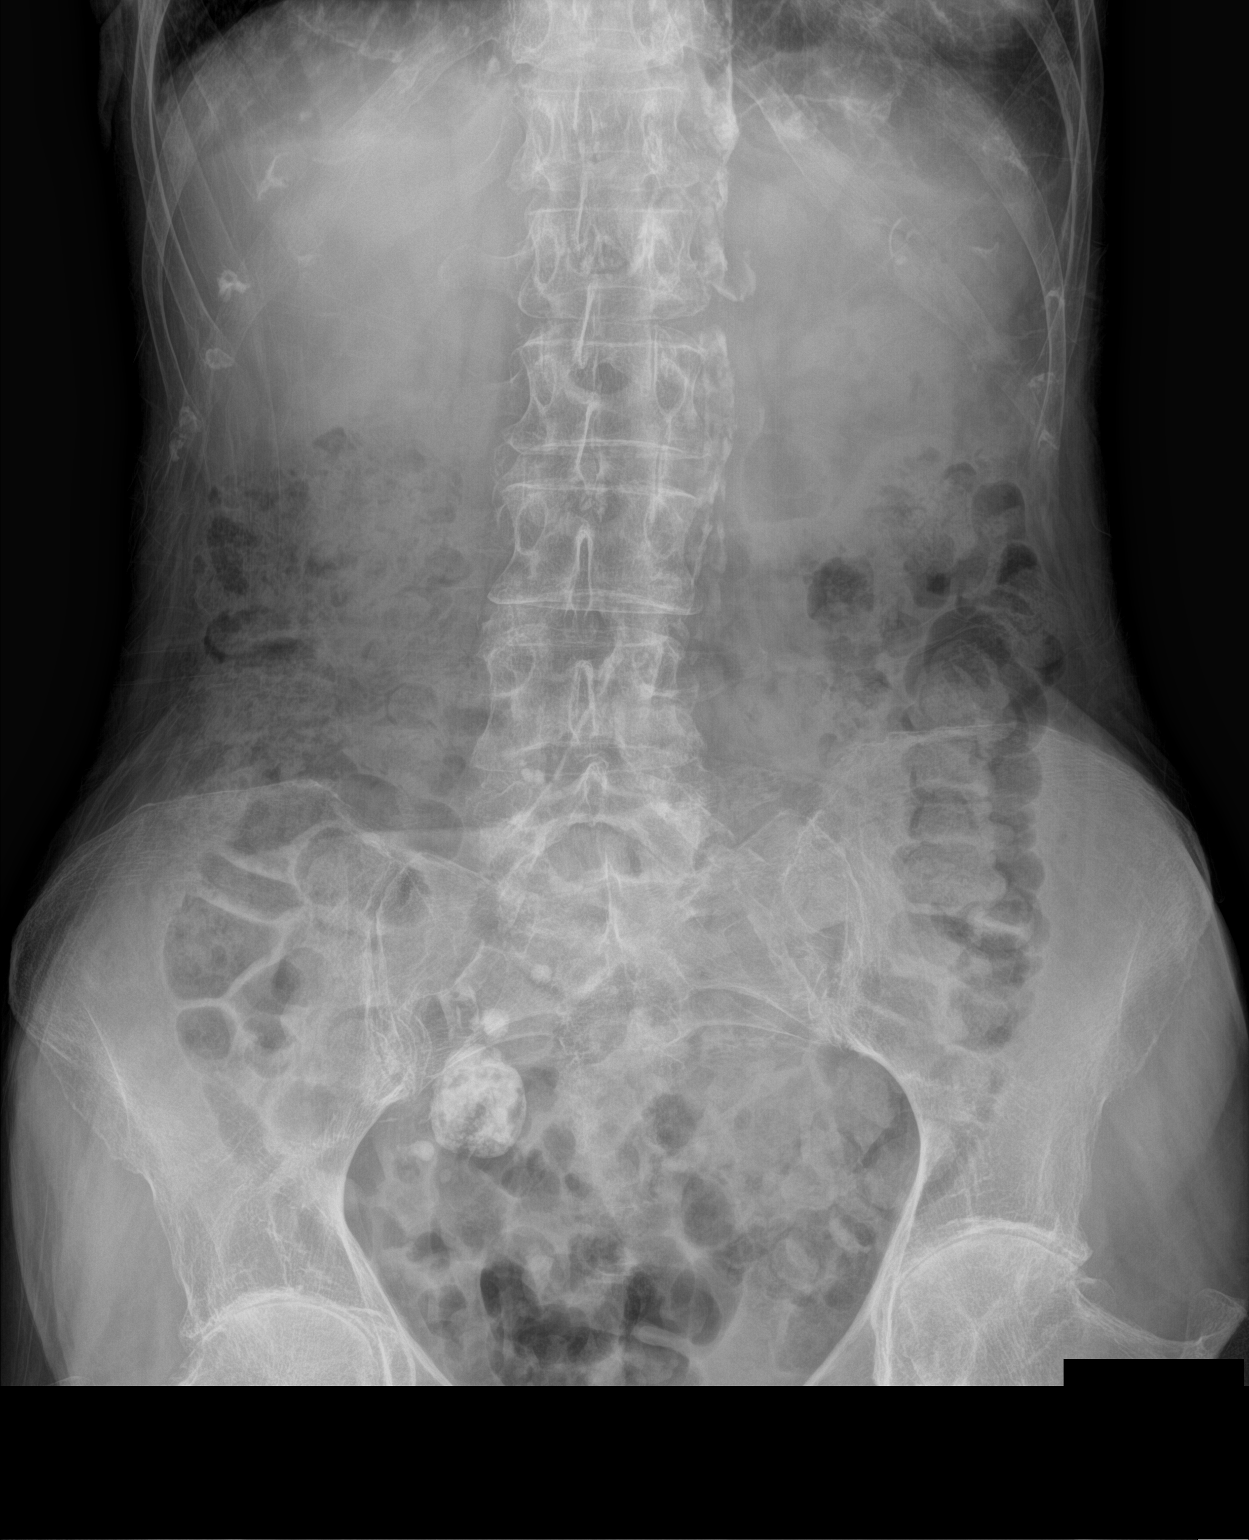

[2 of 2 positions shown; findings below may reference images not displayed]

FINDINGS: Large amount of fecal matter within the colon. No gaseous
distention. No dilated small bowel. Chronic arterial calcification.
Chronic pelvic nodal calcification. Chronic osteoarthritis of the
hips.
IMPRESSION: Possible constipation.  No sign of bowel obstruction.

Aortic atherosclerosis.
